# Patient Record
Sex: Female | Born: 1945 | Race: White | Hispanic: No | Marital: Married | Smoking: Never smoker
Health system: Southern US, Community
[De-identification: ages and names within clinical notes are randomized; demographics above are authoritative.]

## PROBLEM LIST (undated history)

## (undated) DIAGNOSIS — J45909 Unspecified asthma, uncomplicated: Secondary | ICD-10-CM

## (undated) DIAGNOSIS — Z992 Dependence on renal dialysis: Secondary | ICD-10-CM

## (undated) DIAGNOSIS — N186 End stage renal disease: Secondary | ICD-10-CM

## (undated) DIAGNOSIS — M1990 Unspecified osteoarthritis, unspecified site: Secondary | ICD-10-CM

## (undated) DIAGNOSIS — I10 Essential (primary) hypertension: Secondary | ICD-10-CM

## (undated) DIAGNOSIS — H547 Unspecified visual loss: Secondary | ICD-10-CM

## (undated) HISTORY — PX: KNEE ARTHROSCOPY: SUR90

## (undated) HISTORY — PX: APPENDECTOMY: SHX54

---

## 2008-05-20 ENCOUNTER — Encounter: Payer: Self-pay | Admitting: Internal Medicine

## 2008-11-05 ENCOUNTER — Encounter: Payer: Self-pay | Admitting: Internal Medicine

## 2008-11-11 ENCOUNTER — Encounter: Payer: Self-pay | Admitting: Internal Medicine

## 2008-11-25 ENCOUNTER — Ambulatory Visit: Payer: Self-pay | Admitting: Internal Medicine

## 2008-11-25 DIAGNOSIS — R0989 Other specified symptoms and signs involving the circulatory and respiratory systems: Secondary | ICD-10-CM

## 2008-11-25 DIAGNOSIS — J984 Other disorders of lung: Secondary | ICD-10-CM

## 2008-11-25 DIAGNOSIS — R0602 Shortness of breath: Secondary | ICD-10-CM

## 2008-11-25 DIAGNOSIS — R0609 Other forms of dyspnea: Secondary | ICD-10-CM | POA: Insufficient documentation

## 2008-12-03 ENCOUNTER — Telehealth: Payer: Self-pay | Admitting: Internal Medicine

## 2008-12-06 ENCOUNTER — Ambulatory Visit: Payer: Self-pay | Admitting: Internal Medicine

## 2008-12-06 LAB — CONVERTED CEMR LAB
Chloride: 113 meq/L — ABNORMAL HIGH (ref 96–112)
Creatinine, Ser: 0.7 mg/dL (ref 0.4–1.2)
GFR calc non Af Amer: 90.26 mL/min (ref >60)
Sodium: 147 meq/L — ABNORMAL HIGH (ref 135–145)

## 2008-12-08 ENCOUNTER — Ambulatory Visit: Payer: Self-pay | Admitting: Cardiology

## 2008-12-10 ENCOUNTER — Ambulatory Visit: Payer: Self-pay | Admitting: Internal Medicine

## 2008-12-20 ENCOUNTER — Encounter: Payer: Self-pay | Admitting: Internal Medicine

## 2009-09-09 ENCOUNTER — Ambulatory Visit: Payer: Self-pay | Admitting: Internal Medicine

## 2009-09-27 ENCOUNTER — Ambulatory Visit: Payer: Self-pay | Admitting: Internal Medicine

## 2009-09-27 DIAGNOSIS — J45909 Unspecified asthma, uncomplicated: Secondary | ICD-10-CM | POA: Insufficient documentation

## 2010-01-02 ENCOUNTER — Ambulatory Visit: Payer: Self-pay | Admitting: Internal Medicine

## 2010-04-03 ENCOUNTER — Encounter: Payer: Self-pay | Admitting: Internal Medicine

## 2010-04-03 ENCOUNTER — Ambulatory Visit: Payer: Self-pay | Admitting: Internal Medicine

## 2010-04-03 DIAGNOSIS — E669 Obesity, unspecified: Secondary | ICD-10-CM | POA: Insufficient documentation

## 2010-06-06 NOTE — Assessment & Plan Note (Signed)
Summary: rov w/pft   Visit Type:  Follow-up Copy to:  Orvilla Cornwall, FNP-C [facility] Primary Provider/Referring Provider:  Orvilla Cornwall, [facility] and Dr. Doreen Beam  CC:  pt here to discuss CT and PFT results. .  History of Present Illness: Followup dyspnea eval and RML nodule in Obese, non-smoking female.   OV 09/27/2009: Last seen in August 2010. She is returning now after having had CT chest on 09/09/2009. Since last visit overall wel. Has picked some throat cold 6-7 weeks ago with sputum but this is now clearing up. This has not made baseline dyspnea worse. STill dyspneic with exertion of 1 flight of stairs and with heat and humidity. No associated wheeze, hemoptysis, edema, fever, chils, nausea, vomit, diarrhea. Of note, shetake ibuprofen daily for DJD  Current Medications (verified): 1)  Ibuprofen 200 Mg Tabs (Ibuprofen) .... 4 Tabs Twice Daily  Allergies (verified): 1)  ! * Tetanus  Past History:  Past Surgical History: Last updated: 11/25/2008 Appendectomy Knee Arthroscopy  Family History: Last updated: 11/24/2008 Mother- hepatitis C, HTN Fatther-living 86, CAD, hypoglycemic, obesity, HTN  Social History: Last updated: 11/25/2008 Patient never smoked.  Married Self-employed  Risk Factors: Smoking Status: never (11/25/2008)  Past Medical History: Labs: 05/20/2008 BUN 15, Creatinine 0.58, hgb 12.4gm%, Albumin 3.8 (obtained from old chart) Arthritis - On daily ibuprofen  Family History: Reviewed history from 11/24/2008 and no changes required. Mother- hepatitis C, HTN Fatther-living 41, CAD, hypoglycemic, obesity, HTN  Social History: Reviewed history from 11/25/2008 and no changes required. Patient never smoked.  Married Self-employed  Review of Systems       The patient complains of productive cough.  The patient denies shortness of breath with activity, shortness of breath at rest, non-productive cough, coughing up blood,  chest pain, irregular heartbeats, acid heartburn, indigestion, loss of appetite, weight change, abdominal pain, difficulty swallowing, sore throat, tooth/dental problems, headaches, nasal congestion/difficulty breathing through nose, sneezing, itching, ear ache, anxiety, depression, hand/feet swelling, joint stiffness or pain, rash, change in color of mucus, and fever.    Vital Signs:  Patient profile:   65 year old female Height:      64 inches Weight:      300 pounds BMI:     51.68 O2 Sat:      94 % on Room air Temp:     98.4 degrees F oral Pulse rate:   97 / minute BP sitting:   180 / 70  (left arm) Cuff size:   large  Vitals Entered By: [name] CMA (Sep 27, 2009 1:54 PM)  O2 Flow:  Room air CC: pt here to discuss CT and PFT results.  Comments Medications reviewed with patient [name] CMA  Sep 27, 2009 1:57 PM Daytime phone number verified with patient.    Physical Exam  General:  obese.  no distrss Head:  normocephalic and atraumatic Eyes:  PERRLA/EOM intact; conjunctiva and sclera clear Ears:  TMs intact and clear with normal canals Nose:  no deformity, discharge, inflammation, or lesions Mouth:  no deformity or lesions Neck:  no masses, thyromegaly, or abnormal cervical nodes Chest Wall:  no deformities noted Lungs:  clear bilaterally to auscultation and percussion Heart:  regular rate and rhythm, S1, S2 without murmurs, rubs, gallops, or clicks Abdomen:  bowel sounds positive; abdomen soft and non-tender without masses, or organomegaly Msk:  no deformity or scoliosis noted with normal posture Pulses:  pulses normal Extremities:  no clubbing, cyanosis, edema, or deformity  noted Neurologic:  CN II-XII grossly intact with normal reflexes, coordination, muscle strength and tone Skin:  intact without lesions or rashes Cervical Nodes:  no significant adenopathy Axillary Nodes:  no significant adenopathy Psych:  alert and cooperative; normal mood and  affect; normal attention span and concentration   MISC. Report  Procedure date:  09/27/2009  Findings:      PFTS fev1 1.47L/66% FVC 2.25L/74% BD response with fev1 is 230cc and 11% TLC 94% DLCO 18.4/63%  consistentwith asthma   Comments:      independently reviewed  CT of Chest  Procedure date:  09/09/2009  Findings:      Findings: There are no enlarged axillary or supraclavicular lymph nodes.   No enlarged mediastinal or hilar lymph nodes.   There is no pericardial or pleural effusion.   Right middle lobe subpleural nodule measures 6 mm. This is unchanged from prior exam.   There are scar like densities identified within both lung bases.   Review of the visualized osseous structures shows mild spondylosis within the thoracic spine.   Limited imaging through the upper abdomen is unremarkable.   IMPRESSION:   1.  Stable pulmonary nodule within the right middle lobe.  This documents a stability of approximately 9 months. If this patient is at low risk for developing lung cancer a follow-up examination at 18- 24 months is recommended.  If this patient is at increased risk for developing lung cancer than the next follow-up examination should be obtained at 12 months. This recommendation follows the consensus statement: Guidelines for Management of Small Pulmonary Nodules Detected on CT Scans:  A Statement from the Fleischner Society as published in Radiology 2005; 237:395-400.  Available online at:  LittlePageant.ch  Comments:      independently reviewed and agree  Impression & Recommendations:  Problem # 1:  PLEURAL EFFUSION, RIGHT (ICD-511.9) Assessment Improved  resolved on CT chest 09/09/2009  plan no further followup  Orders: Est. Patient Level IV (16109)  Problem # 2:  PULMONARY NODULE (ICD-518.89) Assessment: Unchanged  11/11/2008 [facility] CT - 6mm RML  nodule and 1cm Rt Hilar node 12/08/2008 [facility] - 6mm  RML nodule 09/09/2009 [facility] - 6mm RMLnodule. Unchnaged. No hilar node  plan repeat ct chest July 2012 (24 month CT), she is non smoker Radiology Referral (Radiology)  Orders: Radiology Referral (Radiology) Est. Patient Level IV (60454)  Problem # 3:  SNORING (ICD-786.09) Assessment: Comment Only  Her updated medication list for this problem includes:    Ventolin Hfa 108 (90 Base) Mcg/act Aers (Albuterol sulfate) .Marland Kitchen... 1-2 puffs every 4-6 hours as needed    Qvar 80 Mcg/act Aers (Beclomethasone dipropionate) .Marland Kitchen..Marland Kitchen Two  puffs twice daily. rinse mouth after each use  obese, epworth score is only 2  plan monitor for now  Orders: Est. Patient Level IV (09811)  Problem # 4:  INTRINSIC ASTHMA, UNSPECIFIED (ICD-493.10) Assessment: New  PFTs suggest Moderate Persistent Asthma with low DLCO NOS.  plan start qvar 2 puff two times a day diagnosis explained take pro-air 2 puff as needed rov 3 months with spirometry  Orders: Est. Patient Level IV (91478)  Medications Added to Medication List This Visit: 1)  Ibuprofen 200 Mg Tabs (Ibuprofen) .... 4 tabs twice daily 2)  Ventolin Hfa 108 (90 Base) Mcg/act Aers (Albuterol sulfate) .Marland Kitchen.. 1-2 puffs every 4-6 hours as needed 3)  Qvar 80 Mcg/act Aers (Beclomethasone dipropionate) .... Two  puffs twice daily. rinse mouth after each use  Patient Instructions: 1)  you have asthma on breathing test 2)  see iif you can stop ibuprofen 3)  instead take tylenol extra strength 2 tablets three times daily 4)  see if tylenol cna control your arthritis 5)  for asthma take qvar 2 puff two times a day- maintenance 6)  take pro-air 2 puff as needed - for emergency use 7)  learn technique from my nurse 8)  return in 3 months for followup asthma 9)  at folowup will do office spiromerty 10)  your lung nodule is staable since [id number] 11)  Next CT scan chest is July/August 2012 12)  call or come sooner for any new  problems Prescriptions: QVAR 80 MCG/ACT  AERS (BECLOMETHASONE DIPROPIONATE) Two  puffs twice daily. Rinse mouth after each use  #1 x 6   Entered and Authorized by:   Kalman Shan MD   Signed by:   Kalman Shan MD on 09/27/2009   Method used:   Electronically to        [facility]* (retail)       [address]       [place], [place]  [address]       Ph: [phone]       Fax: [phone]   RxID:   [id number] VENTOLIN HFA 108 (90 BASE) MCG/ACT  AERS (ALBUTEROL SULFATE) 1-2 puffs every 4-6 hours as needed  #1 x 6   Entered and Authorized by:   Kalman Shan MD   Signed by:   Kalman Shan MD on 09/27/2009   Method used:   Electronically to        [facility]* (retail)       [address]       [place], [place]  [address]       Ph: [phone]       Fax: [phone]   RxID:   [id number]     Appended Document: rov w/pft     Allergies: 1)  ! * Tetanus   Other Orders: Prescription Created Electronically [id number])

## 2010-06-06 NOTE — Assessment & Plan Note (Signed)
Summary: per recall/mhh   Visit Type:  Follow-up Copy to:  Orvilla Cornwall, FNP-C [facility] Primary Provider/Referring Provider:  Orvilla Cornwall, FNP-C [facility] and Dr. Doreen Beam  CC:  3 month follow up, pt was  changed from qvar to dulera 100, states she does not notice any change in sob, sob worse with exertion, and not using pro-air.  History of Present Illness: Followup dyspnea eval and RML nodule in Obese, non-smoking female.   OV 01/02/2010:  Last seen in May 2011 and August 2011.At last visit I noticed improvement in fev1 with QVAR but dyspnea was no better. SO, lsat visit we started empiric dulera. TOday she states dulera is not helping either.   STill dyspneic walking 1 flight of stairs and relieved by rest. Heat and humidity stil make her more dyspneic. Marland Kitchen No other complaints.  No associated wheeze, hemoptysis, edema, fever, chils, nausea, vomit, diarrhea. She feels weight and deconditioning are main issues contributing to dyspnea. Not inclined for rehab or CPST due to knee issues and $ issues. In terms of nodule, final 24 month CT is pending in July 2012. Of note, she has lost insurance and is finding it tough to pay bills.   Preventive Screening-Counseling & Management  Alcohol-Tobacco     Smoking Status: never  Current Medications (verified): 1)  Aleve 220 Mg Tabs (Naproxen Sodium) .... As Directed 2)  Proair Hfa 108 (90 Base) Mcg/act Aers (Albuterol Sulfate) .... As Needed 3)  Dulera 100-5 Mcg/act Aero (Mometasone Furo-Formoterol Fum)  Allergies (verified): 1)  ! * Tetanus  Past History:  Past medical, surgical, family and social histories (including risk factors) reviewed, and no changes noted (except as noted below).  Past Medical History: Reviewed history from 09/27/2009 and no changes required. Labs: 05/20/2008 BUN 15, Creatinine 0.58, hgb 12.4gm%, Albumin 3.8 (obtained from old chart) Arthritis - On daily ibuprofen  Past Surgical  History: Reviewed history from 11/25/2008 and no changes required. Appendectomy Knee Arthroscopy  Family History: Reviewed history from 11/24/2008 and no changes required. Mother- hepatitis C, HTN Fatther-living 98, CAD, hypoglycemic, obesity, HTN  Social History: Reviewed history from 11/25/2008 and no changes required. Patient never smoked.  Married Self-employed  Review of Systems       The patient complains of shortness of breath with activity, non-productive cough, weight change, hand/feet swelling, and joint stiffness or pain.  The patient denies shortness of breath at rest, productive cough, coughing up blood, chest pain, irregular heartbeats, acid heartburn, indigestion, loss of appetite, abdominal pain, difficulty swallowing, sore throat, tooth/dental problems, headaches, nasal congestion/difficulty breathing through nose, sneezing, itching, ear ache, anxiety, depression, rash, change in color of mucus, and fever.    Vital Signs:  Patient profile:   65 year old female Height:      65 inches Weight:      309.4 pounds BMI:     51.67 O2 Sat:      96 % on Room air Temp:     97.8 degrees F oral Pulse rate:   93 / minute BP sitting:   140 / 82  (left arm) Cuff size:   large  Vitals Entered By: Renold Genta RCP, LPN (April 03, 2010 9:25 AM)  O2 Flow:  Room air CC: 3 month follow up, pt was  changed from qvar to dulera 100, states she does not notice any change in sob, sob worse with exertion, not using pro-air Comments Medications reviewed with patient Renold Genta RCP, LPN  April 03, 2010 9:35 AM    Physical Exam  General:  obese.  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 Pulses:  pulses normal Extremities:  no clubbing, cyanosis, edema, or deformity noted Neurologic:  CN II-XII grossly intact with normal reflexes, coordination, muscle strength and tone Skin:  intact without lesions or rashes Cervical Nodes:  no significant adenopathy Axillary Nodes:  no significant adenopathy Psych:  alert and cooperative; normal mood and affect; normal attention span and concentration   Pulmonary Function Test Date: 04/03/2010 Gender: Female  Pre-Spirometry FVC    Value: 2.6 L/min   % Pred: 78 % FEV1    Value: 1.8 L     % Pred: 71 % FEV1/FVC  Value: 70 %     Comments: unchnaged  Impression & Recommendations:  Problem # 1:  OBESITY, UNSPECIFIED (ICD-278.00) Assessment Unchanged check TSH. FT4 Orders: TLB-T3, Free (Triiodothyronine) (84481-T3FREE) TLB-TSH (Thyroid Stimulating Hormone) (84443-TSH) Est. Patient Level III (16109)  Problem # 2:  PULMONARY NODULE (ICD-518.89) Assessment: Unchanged  11/11/2008 [facility] CT - 6mm RML  nodule and 1cm Rt Hilar node 12/08/2008 [facility] - 6mm RML nodule 09/09/2009 [facility] - 6mm RMLnodule. Unchnaged. No hilar node  plan repeat ct chest July 2012 (24 month CT), she is non smoker Radiology Referral (Radiology)  Orders: Est. Patient Level III (60454)  Problem # 3:  PLEURAL EFFUSION, RIGHT (ICD-511.9) Assessment: Comment Only  resolved on CT chest 09/09/2009  plan no further followup  Problem # 4:  SHORTNESS OF BREATH (SOB) (ICD-786.05) Assessment: Unchanged empiric dulera and qvar have not helped. TEHre is no chest pain to suggest CAD. CT has ruled out PE in past (2010). Non-smoker. No evidence of COPD or IPF on prior  CTs. Suspect deconditoning and obesity (BMI 51) as majore etiologies. SHe is unable to do CPST due to knee issues. Unwilling/Unable to do rehab. $ issues as well. So, will monitor clinically. IF develops chest pain or dyspnea worsens, will get cardiac stress test Orders: TLB-T3, Free (Triiodothyronine) (84481-T3FREE) TLB-TSH (Thyroid Stimulating Hormone) (84443-TSH)  Medications Added to Medication List This Visit: 1)  Dulera 100-5 Mcg/act Aero (Mometasone furo-formoterol fum)  Patient Instructions: 1)  please stop dulera 2)  check blood test for thyroid function studies today 3)  retrun in July 2012 with CT chest 4)  in between, keep any eye on your symptoms 5)  if you get more short of breath or have chest pain, then call us or  come sooner

## 2010-06-06 NOTE — Assessment & Plan Note (Signed)
Summary: 3 months/ mbw   Visit Type:  Follow-up Copy to:  Orvilla Cornwall, FNP-C [facility] Primary Provider/Referring Provider:  Orvilla Cornwall, [facility] Internal Med and Dr. Doreen Beam  CC:  Pt here for 3 month follow-up. Pt states she is taking qvar daily  and but sees no difference in breathing. Marland Kitchen  History of Present Illness: Followup dyspnea eval and RML nodule in Obese, non-smoking female.   OV 01/02/2010:  Last seen in May 2011. Started QVAR for PFTs that suggested asthma. Now following up. No difference in dyspnea on exertion despite QVAR and switching ibuprofen for alleve. STill dyspneic walking 1 flight of stairs and relieved by rest. Heat and humidity stil make her more dyspneic. Marland Kitchen No other complaints.  No associated wheeze, hemoptysis, edema, fever, chils, nausea, vomit, diarrhea  Preventive Screening-Counseling & Management  Alcohol-Tobacco     Smoking Status: never  Current Medications (verified): 1)  Aleve 220 Mg Tabs (Naproxen Sodium) .... As Directed 2)  Qvar 80 Mcg/act Aers (Beclomethasone Dipropionate) .... 2 Puffs Twice Daily 3)  Proair Hfa 108 (90 Base) Mcg/act Aers (Albuterol Sulfate) .... As Needed  Allergies (verified): 1)  ! * Tetanus  Past History:  Past medical, surgical, family and social histories (including risk factors) reviewed, and no changes noted (except as noted below).  Past Medical History: Reviewed history from 09/27/2009 and no changes required. Labs: 05/20/2008 BUN 15, Creatinine 0.58, hgb 12.4gm%, Albumin 3.8 (obtained from old chart) Arthritis - On daily ibuprofen  Past Surgical History: Reviewed history from 11/25/2008 and no changes required. Appendectomy Knee Arthroscopy  Family History: Reviewed history from 11/24/2008 and no changes required. Mother- hepatitis C, HTN Fatther-living 80, CAD, hypoglycemic, obesity, HTN  Social History: Reviewed history from 11/25/2008 and no changes required. Patient never smoked.   Married Self-employed  Review of Systems       The patient complains of dyspnea on exertion.  The patient denies anorexia, fever, weight loss, weight gain, vision loss, decreased hearing, hoarseness, chest pain, syncope, peripheral edema, prolonged cough, headaches, hemoptysis, abdominal pain, melena, hematochezia, severe indigestion/heartburn, hematuria, incontinence, genital sores, muscle weakness, suspicious skin lesions, transient blindness, difficulty walking, depression, unusual weight change, abnormal bleeding, enlarged lymph nodes, angioedema, breast masses, and testicular masses.         djd  Vital Signs:  Patient profile:   65 year old female Height:      64 inches Weight:      303 pounds BMI:     52.20 O2 Sat:      97 % on Room air Temp:     98.1 degrees F oral Pulse rate:   87 / minute BP sitting:   130 / 78  (left arm) Cuff size:   large  Vitals Entered By: [name] CMA (January 02, 2010 1:35 PM)  O2 Flow:  Room air CC: Pt here for 3 month follow-up. Pt states she is taking qvar daily , but sees no difference in breathing.  Comments Medications reviewed with patient [name] CMA  January 02, 2010 1:37 PM Daytime phone number verified with patient.    Physical Exam  General:  obese.  no distrss Head:  normocephalic and atraumatic Eyes:  PERRLA/EOM intact; conjunctiva and sclera clear Ears:  TMs intact and clear with normal canals Nose:  no deformity, discharge, inflammation, or lesions Mouth:  no deformity or lesions Neck:  no masses, thyromegaly, or abnormal cervical nodes Chest Wall:  no deformities noted Lungs:  clear bilaterally  to auscultation and percussion Heart:  regular rate and rhythm, S1, S2 without murmurs, rubs, gallops, or clicks Abdomen:  bowel sounds positive; abdomen soft and non-tender without masses, or organomegaly Msk:  no deformity or scoliosis noted with normal posture Pulses:  pulses normal Extremities:  no clubbing,  cyanosis, edema, or deformity noted Neurologic:  CN II-XII grossly intact with normal reflexes, coordination, muscle strength and tone Skin:  intact without lesions or rashes Cervical Nodes:  no significant adenopathy Axillary Nodes:  no significant adenopathy Psych:  alert and cooperative; normal mood and affect; normal attention span and concentration   Pulmonary Function Test Date: 01/02/2010 1:38 PM Gender: Female  Pre-Spirometry FVC    Value: 2.50 L/min   % Pred: 77.50 % FEV1    Value: 1.68 L     Pred: 2.48 L     % Pred: 67.60 % FEV1/FVC  Value: 67.13 %     % Pred: 86.50 %  Comments: 200 cc improvement in fev1 after starting qvar  Evaluation: moderate obstruction with significant bronchodilator response  Impression & Recommendations:  Problem # 1:  INTRINSIC ASTHMA, UNSPECIFIED (ICD-493.10) Assessment Unchanged  PFTs May 2011 suggest Moderate Persistent Asthma with low DLCO NOS. SPiro shows 200cc improvementi n FEv1 with QVAR in August 2011 but no change in symptoms  plan change qvar to dulera (Samples) 2 puff two times a day call in 1 month to report progress take pro-air 2 puff as needed rov 3 months with spirometry refer pulmonary rehab at [facility]  Problem # 2:  PULMONARY NODULE (ICD-518.89) Assessment: Comment Only  11/11/2008 [facility] CT - 6mm RML  nodule and 1cm Rt Hilar node 12/08/2008 [facility] - 6mm RML nodule 09/09/2009 [facility] - 6mm RMLnodule. Unchnaged. No hilar node  plan repeat ct chest July 2012 (24 month CT), she is non smoker Radiology Referral (Radiology)  Orders: Est. Patient Level III (29562)  Problem # 3:  SNORING (ICD-786.09) Assessment: Comment Only  Her updated medication list for this problem includes:    Dulera 100-5 Mcg/act Aero (Mometasone furo-formoterol fum) .Marland Kitchen... 2 puffs twice per day    Proair Hfa 108 (90 Base) Mcg/act Aers (Albuterol sulfate) .Marland Kitchen... As needed  Medications Added to Medication List This Visit: 1)  Aleve 220 Mg  Tabs (Naproxen sodium) .... As directed 2)  Dulera 100-5 Mcg/act Aero (Mometasone furo-formoterol fum) .... 2 puffs twice per day 3)  Proair Hfa 108 (90 Base) Mcg/act Aers (Albuterol sulfate) .... As needed  Other Orders: Prescription Created Electronically [id number])  Patient Instructions: 1)  STOP QVAR 2)  Take 2 samples of dulera  3)  Take dulera 2 puff two times a day 4)  Call me in 1 month to report progress 5)  ATtend pulmonary rehab at [facility] 6)  have flu shot in fall 7)  return in 3 months 8)  will do spirometry at followup Prescriptions: DULERA 100-5 MCG/ACT AERO (MOMETASONE FURO-FORMOTEROL FUM) 2 puffs twice per day  #1 x 6   Entered and Authorized by:   Kalman Shan MD   Signed by:   Kalman Shan MD on 01/02/2010   Method used:   Historical   RxID:   [id number]    CardioPerfect Spirometry  ID: [id number] Patient: Kaylee [name] DOB: 21-Nov-1945 Age: 65 Years Old Sex: Female Race: White Physician: ramaswamy Height: 64 Weight: 303 Status: Unconfirmed Past Medical History:  Labs: 05/20/2008 BUN 15, Creatinine 0.58, hgb 12.4gm%, Albumin 3.8 (obtained from old chart) Arthritis - On daily ibuprofen Recorded: 01/02/2010  1:38 PM  Parameter  Measured Predicted %Predicted FVC     2.50        3.22        77.50 FEV1     1.68        2.48        67.60 FEV1%   67.13        77.63        86.50 PEF    4.43        6.15        72   Interpretation:

## 2010-06-06 NOTE — Miscellaneous (Signed)
Summary: Orders Update pft charges  Clinical Lists Changes  Orders: Added new Service order of Carbon Monoxide diffusing w/capacity (94720) - Signed Added new Service order of Lung Volumes (94240) - Signed Added new Service order of Spirometry (Pre & Post) (94060) - Signed 

## 2010-11-07 ENCOUNTER — Other Ambulatory Visit: Payer: Self-pay | Admitting: Internal Medicine

## 2010-11-07 DIAGNOSIS — R911 Solitary pulmonary nodule: Secondary | ICD-10-CM

## 2010-11-10 ENCOUNTER — Ambulatory Visit (INDEPENDENT_AMBULATORY_CARE_PROVIDER_SITE_OTHER): Payer: Self-pay | Admitting: Adult Health

## 2010-11-10 ENCOUNTER — Encounter: Payer: Self-pay | Admitting: Adult Health

## 2010-11-10 DIAGNOSIS — J45909 Unspecified asthma, uncomplicated: Secondary | ICD-10-CM

## 2010-11-10 DIAGNOSIS — J984 Other disorders of lung: Secondary | ICD-10-CM

## 2010-11-10 NOTE — Assessment & Plan Note (Addendum)
Documented Bronchodilator change on previous PFT c/w with Asthma  Not compensated off controller inhalers.  Suspect her dyspnea is multifactoral with obesity, fluid retention- would like to at least  Check an xray and routine labs however she declines.   Plan:  Begin Advair 100/12mcg 1 puffs Twice daily  -brush rinse and gargle after use. (Advair has a good pt assistance program)  We do not have any Dulera sample, Advair inhaler teaching .  We are looking into patient assistance program for Advair.  Follow up with Family doctor regarding you blood pressure.  If you change your mind about xray/CT  and labs , please call back  follow up Dr. Marchelle Gearing in 6-8 weeks and As needed

## 2010-11-10 NOTE — Assessment & Plan Note (Signed)
Due for follow up CT chest however she declines.  She says she will call back if she changes her mind.

## 2010-11-10 NOTE — Progress Notes (Signed)
Subjective:    Patient ID: Kaylee [name], female    DOB: 1946-03-23, 65 y.o.   MRN: [id number]  HPI 65 yo never smoker ,with Asthma   OV 01/02/2010: Last seen in May 2011 and August 2011.At last visit I noticed improvement in fev1 with QVAR but dyspnea was no better. SO, lsat visit we started empiric dulera. TOday she states dulera is not helping either. STill dyspneic walking 1 flight of stairs and relieved by rest. Heat and humidity stil make her more dyspneic. Marland Kitchen No other complaints. No associated wheeze, hemoptysis, edema, fever, chils, nausea, vomit, diarrhea. She feels weight and deconditioning are main issues contributing to dyspnea. Not inclined for rehab or CPST due to knee issues and $ issues. In terms of nodule, final 24 month CT is pending in July 2012. Of note, she has lost insurance and is finding it tough to pay bills.   11/10/2010 Acute OV  Complains of increased SOB, some wheezing and coughing x44months.  Previous on [facility] which did help some but she could not afford due to finances . She and her husband do not work and have no income. They do not qualify for Medicaid. She does not have any medication coverage. She is due for a follow up CT chest for pulmonary nodule. She is a never smoker and last year serial follow up CT was without change. She declines at this time due to no insurance , I suggested she get a xray today however she declined.  She says she has intermittent wheezing and dry cough at times. Also some ankle edema. Blood pressure is elevated today , we discussed labs however she declined . Says she  Will go see her PCP soon to discuss this.      Review of Systems Constitutional:   No  weight loss, night sweats,  Fevers, chills, fatigue, or  lassitude.  HEENT:   No headaches,  Difficulty swallowing,  Tooth/dental problems, or  Sore throat,                No sneezing, itching, ear ache, nasal congestion, post nasal drip,   CV:  No chest pain,  Orthopnea, PND, ,  anasarca, dizziness, palpitations, syncope.   GI  No heartburn, indigestion, abdominal pain, nausea, vomiting, diarrhea, change in bowel habits, loss of appetite, bloody stools.   Resp:    No excess mucus, no productive cough,     No coughing up of blood.  No change in color of mucus.     No chest wall deformity  Skin: no rash or lesions.  GU: no dysuria, change in color of urine, no urgency or frequency.  No flank pain, no hematuria   MS:  No joint  swelling.  No decreased range of motion.     Psych:  No change in mood or affect. No depression or anxiety.  No memory loss.          Objective:   Physical Exam GEN: A/Ox3; pleasant , NAD, morbidly obese   HEENT:  [place]/AT,  EACs-clear, TMs-wnl, NOSE-clear, THROAT-clear, no lesions, no postnasal drip or exudate noted.   NECK:  Supple w/ fair ROM; no JVD; normal carotid impulses w/o bruits; no thyromegaly or nodules palpated; no lymphadenopathy.  RESP  Clear  P & A; w/o, wheezes/ rales/ or rhonchi.no accessory muscle use, no dullness to percussion  CARD:  RRR, no m/r/g  ,1+peripheral edema, pulses intact, no cyanosis or clubbing.  GI:   Soft & nt; nml bowel sounds;  no organomegaly or masses detected.  Musco: Warm bil, no deformities or joint swelling noted.   Neuro: alert, no focal deficits noted.    Skin: Warm, no lesions or rashes         Assessment & Plan:

## 2010-11-10 NOTE — Patient Instructions (Addendum)
Begin Advair 100/53mcg 1 puffs Twice daily  -brush rinse and gargle after use.  We are looking into patient assistance program for Advair.  Follow up with Family doctor regarding you blood pressure.  If you change your mind about xray/CT  and labs , please call back  follow up Dr. Marchelle Gearing in 6-8 weeks and As needed

## 2010-11-22 ENCOUNTER — Other Ambulatory Visit: Payer: Self-pay

## 2010-11-24 ENCOUNTER — Telehealth: Payer: Self-pay | Admitting: Internal Medicine

## 2010-11-24 NOTE — Telephone Encounter (Signed)
Called, spoke with pt.  She is requesting samples advair 100/50.  Still working on pt assistance forms.  Will bring those by when she picks up samples.  2 samples left at front -- pt aware  Lot # [id number] Exp Date 01/2012

## 2010-12-01 ENCOUNTER — Other Ambulatory Visit: Payer: Self-pay | Admitting: Internal Medicine

## 2010-12-01 MED ORDER — FLUTICASONE-SALMETEROL 100-50 MCG/DOSE IN AEPB: 1.0000 | INHALATION_SPRAY | Freq: Two times a day (BID) | RESPIRATORY_TRACT | Status: DC

## 2010-12-11 ENCOUNTER — Telehealth: Payer: Self-pay | Admitting: Internal Medicine

## 2010-12-11 NOTE — Telephone Encounter (Signed)
Pt last seen 7.6.12 by TP and has upcoming appt with MR 9.7.12.  Has begun process for patient assistance for advair but pt reports this is still pending.  2 advair samples given to pt in lobby.  Will sign off on phone note.

## 2011-01-12 ENCOUNTER — Encounter: Payer: Self-pay | Admitting: Internal Medicine

## 2011-01-12 ENCOUNTER — Ambulatory Visit (INDEPENDENT_AMBULATORY_CARE_PROVIDER_SITE_OTHER): Payer: Self-pay | Admitting: Internal Medicine

## 2011-01-12 VITALS — BP 142/80 | HR 103 | Temp 97.9°F | Ht 64.0 in | Wt 313.4 lb

## 2011-01-12 DIAGNOSIS — R0602 Shortness of breath: Secondary | ICD-10-CM

## 2011-01-12 DIAGNOSIS — Z23 Encounter for immunization: Secondary | ICD-10-CM

## 2011-01-12 DIAGNOSIS — J984 Other disorders of lung: Secondary | ICD-10-CM

## 2011-01-12 DIAGNOSIS — J45909 Unspecified asthma, uncomplicated: Secondary | ICD-10-CM

## 2011-01-12 NOTE — Assessment & Plan Note (Signed)
Multifactorial: asthma, obesity, djd - deconditoning. Dyspnea persists despite asthma control. She is not interested in further workup or rehab intervention at this stge due to $ issues

## 2011-01-12 NOTE — Progress Notes (Signed)
Subjective:    Patient ID: Kaylee [name], female    DOB: 02-29-1946, 65 y.o.   MRN: [id number]  HPI  65 yo never smoker ,with Asthma   OV 01/02/2010: Last seen in May 2011 and August 2011.At last visit I noticed improvement in fev1 with QVAR but dyspnea was no better. SO, lsat visit we started empiric dulera. TOday she states dulera is not helping either. STill dyspneic walking 1 flight of stairs and relieved by rest. Heat and humidity stil make her more dyspneic. Marland Kitchen No other complaints. No associated wheeze, hemoptysis, edema, fever, chils, nausea, vomit, diarrhea. She feels weight and deconditioning are main issues contributing to dyspnea. Not inclined for rehab or CPST due to knee issues and $ issues. In terms of nodule, final 24 month CT is pending in July 2012. Of note, she has lost insurance and is finding it tough to pay bills.   11/10/2010 Acute OV  Wit NP Tammy Parrrett Complains of increased SOB, some wheezing and coughing x19months.  Previous on [facility] which did help some but she could not afford due to finances . She and her husband do not work and have no income. They do not qualify for Medicaid. She does not have any medication coverage. She is due for a follow up CT chest for pulmonary nodule. She is a never smoker and last year serial follow up CT was without change. She declines at this time due to no insurance , I suggested she get a xray today however she declined.  She says she has intermittent wheezing and dry cough at times. Also some ankle edema. Blood pressure is elevated today , we discussed labs however she declined . Says she  Will go see her PCP soon to discuss this.   REC Begin Advair 100/69mcg 1 puffs Twice daily  -brush rinse and gargle after use.  We are looking into patient assistance program for Advair.  Follow up with Family doctor regarding you blood pressure.  If you change your mind about xray/CT  and labs , please call back  follow up Dr. Marchelle Gearing in 6-8  weeks and As needed     OV 9./7/12: OV for asthma, dyspnea (multifactorial) and pulmnonary nodule. REviewed NP visit notes from 11/10/10. She is getting advair for free now due to assistance program. Reports compliance. Feels it is helping overall with dyspnea, wheeze but states that some days does not feel a difference. REcently diuressed for edema with lasix but this has not made difference with dyspnea. A week ago started on anitihypertensive ACE inhibitor but unclear if this has made resp issues worse yet. She acknowledges, that obesity, DJD (antalgic gait) are contriubing to dyspnea on exertion of moderate intensity relieved by rest and that she could be deconditioned as a result. Discussed pulmonary nodule: she is categorical that she does not want imagine due to $ issues.  Past med: interim changes of lisinopril for bp and lasix for edema  Social: Financial issues. She and hsuband 1-2 years away from medicare. 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 Does not want to liquidate this  Family hx: husband suffered NSAID related Gastric ulcer - paying off $20k hospital bill.    Review of Systems  Constitutional: Positive for unexpected weight change. Negative for fever.       From fluid pills, lost 5 pounds  HENT: Negative for ear pain, nosebleeds, congestion, sore throat, rhinorrhea, sneezing, trouble swallowing, dental  problem, postnasal drip and sinus pressure.   Eyes: Negative for redness and itching.  Respiratory: Positive for cough and shortness of breath. Negative for chest tightness and wheezing.   Cardiovascular: Positive for leg swelling. Negative for palpitations.       Feet  Gastrointestinal: Negative for nausea and vomiting.  Genitourinary: Negative for dysuria.  Musculoskeletal: Positive for joint swelling.  Skin: Negative for rash.  Neurological: Negative for headaches.  Hematological: Bruises/bleeds easily.    Psychiatric/Behavioral: Positive for dysphoric mood. The patient is not nervous/anxious.        Objective:   Physical Exam  Vitals reviewed. Constitutional: She is oriented to person, place, and time. She appears well-developed and well-nourished. No distress.       obese  HENT:  Head: Normocephalic and atraumatic.  Right Ear: External ear normal.  Left Ear: External ear normal.  Mouth/Throat: Oropharynx is clear and moist. No oropharyngeal exudate.  Eyes: Conjunctivae and EOM are normal. Pupils are equal, round, and reactive to light. Right eye exhibits no discharge. Left eye exhibits no discharge. No scleral icterus.  Neck: Normal range of motion. Neck supple. No JVD present. No tracheal deviation present. No thyromegaly present.  Cardiovascular: Normal rate, regular rhythm, normal heart sounds and intact distal pulses.  Exam reveals no gallop and no friction rub.   No murmur heard. Pulmonary/Chest: Effort normal and breath sounds normal. No respiratory distress. She has no wheezes. She has no rales. She exhibits no tenderness.  Abdominal: Soft. Bowel sounds are normal. She exhibits no distension and no mass. There is no tenderness. There is no rebound and no guarding.       obese  Musculoskeletal: Normal range of motion. She exhibits no edema and no tenderness.       Antalgic gait  Lymphadenopathy:    She has no cervical adenopathy.  Neurological: She is alert and oriented to person, place, and time. She has normal reflexes. No cranial nerve deficit. She exhibits normal muscle tone. Coordination normal.  Skin: Skin is warm and dry. No rash noted. She is not diaphoretic. No erythema. No pallor.  Psychiatric: Her behavior is normal. Judgment and thought content normal.       Flat affect          Assessment & Plan:

## 2011-01-12 NOTE — Patient Instructions (Addendum)
#  Shortness of breath  - this is due to asthma, but weight and lack of fitnesss palying a role #ASthma   Continue advair   - have flu shot #BP  - stop lisinopril  - this can make your asthma worse  - my nurse will call Dr Sherril Croon office now and inform of this and have them call in a generic replacement that is not a beta blocker #Pulmonary nodule  - respect your decision not to have followup cxr and ct given all our discussions and you weighing it against cost issues  - will follow this clinically till you decide you are ready to get imaging #followup  - 6 months or sooner if needed

## 2011-01-12 NOTE — Assessment & Plan Note (Signed)
Appears stable. Cntinue advair for now Stop lisinopril (she will take it up with her PMD) I considered norvasc but she suffers from pedal edema

## 2011-01-12 NOTE — Assessment & Plan Note (Signed)
Declined followup cxr or ct due to $ issues

## 2011-12-17 ENCOUNTER — Telehealth: Payer: Self-pay | Admitting: Internal Medicine

## 2011-12-17 NOTE — Telephone Encounter (Signed)
I spoke with pt and is aware 2 samples of the advair 100-50 has been left upfront for pick up. She voiced her understanding and needed nothing further

## 2012-01-30 ENCOUNTER — Encounter: Payer: Self-pay | Admitting: Internal Medicine

## 2012-01-30 ENCOUNTER — Ambulatory Visit (INDEPENDENT_AMBULATORY_CARE_PROVIDER_SITE_OTHER): Payer: Self-pay | Admitting: Internal Medicine

## 2012-01-30 VITALS — BP 142/88 | HR 72 | Ht 64.0 in | Wt 322.6 lb

## 2012-01-30 DIAGNOSIS — J45909 Unspecified asthma, uncomplicated: Secondary | ICD-10-CM

## 2012-01-30 DIAGNOSIS — E669 Obesity, unspecified: Secondary | ICD-10-CM

## 2012-01-30 DIAGNOSIS — R911 Solitary pulmonary nodule: Secondary | ICD-10-CM

## 2012-01-30 DIAGNOSIS — Z23 Encounter for immunization: Secondary | ICD-10-CM

## 2012-01-30 DIAGNOSIS — J984 Other disorders of lung: Secondary | ICD-10-CM

## 2012-01-30 MED ORDER — ALBUTEROL SULFATE HFA 108 (90 BASE) MCG/ACT IN AERS: 2.0000 | INHALATION_SPRAY | Freq: Four times a day (QID) | RESPIRATORY_TRACT | Status: DC | PRN

## 2012-01-30 MED ORDER — FLUTICASONE-SALMETEROL 100-50 MCG/DOSE IN AEPB: 1.0000 | INHALATION_SPRAY | Freq: Two times a day (BID) | RESPIRATORY_TRACT | Status: DC

## 2012-01-30 NOTE — Assessment & Plan Note (Signed)
#  Lung nodule   - CT scan march 2014 after medicare kicks in per her  #Followup   - march 2014 with CT chest  - lost atleast 10# by March 2014

## 2012-01-30 NOTE — Assessment & Plan Note (Signed)
#  Weight management  -  we discussed extensively about weight management   - follow low glycemic diet plan that I outlined for you after extensive discussion  - For breakfast   - recommend steel cut oat meal or fiber one 60 cal (half to one cup) with low sugar 60 cal silk soymilk and some berries and one egg  - snack with berries or low glycemic fruit or mens health 35gm mixed-nut pack  - lunch and dinner - unlimited non-starchy vegetable (prefer raw fresh or roasted or grilled) with skinless chicken or fish   - drink lot of water  - avoid all moderate and high glycemic foods  - measure weight once  A week   > 50% of this > 25 min visit spent in face to face counseling (15 min visit converted to 25 min)

## 2012-01-30 NOTE — Progress Notes (Signed)
Subjective:    Patient ID: Kaylee [name], female    DOB: [birth year], 66 y.o.   MRN: [id number]  HPI 66 yo never smoker ,with Asthma    OV 01/02/2010: Last seen in May 2011 and August 2011.At last visit I noticed improvement in fev1 with QVAR but dyspnea was no better. SO, lsat visit we started empiric dulera. TOday she states dulera is not helping either. STill dyspneic walking 1 flight of stairs and relieved by rest. Heat and humidity stil make her more dyspneic. Marland Kitchen No other complaints. No associated wheeze, hemoptysis, edema, fever, chils, nausea, vomit, diarrhea. She feels weight and deconditioning are main issues contributing to dyspnea. Not inclined for rehab or CPST due to knee issues and $ issues. In terms of nodule, final 24 month CT is pending in July 2012. Of note, she has lost insurance and is finding it tough to pay bills.   11/10/2010 Acute OV  Wit NP Tammy Parrrett Complains of increased SOB, some wheezing and coughing x64months.  Previous on [facility] which did help some but she could not afford due to finances . She and her husband do not work and have no income. They do not qualify for Medicaid. She does not have any medication coverage. She is due for a follow up CT chest for pulmonary nodule. She is a never smoker and last year serial follow up CT was without change. She declines at this time due to no insurance , I suggested she get a xray today however she declined.  She says she has intermittent wheezing and dry cough at times. Also some ankle edema. Blood pressure is elevated today , we discussed labs however she declined . Says she  Will go see her PCP soon to discuss this.   REC Begin Advair 100/81mcg 1 puffs Twice daily  -brush rinse and gargle after use.  We are looking into patient assistance program for Advair.  Follow up with Family doctor regarding you blood pressure.  If you change your mind about xray/CT  and labs , please call back  follow up Dr. Marchelle Gearing in 6-8  weeks and As needed     OV 9./7/12: OV for asthma, dyspnea (multifactorial) and pulmnonary nodule. REviewed NP visit notes from 11/10/10. She is getting advair for free now due to assistance program. Reports compliance. Feels it is helping overall with dyspnea, wheeze but states that some days does not feel a difference. REcently diuressed for edema with lasix but this has not made difference with dyspnea. A week ago started on anitihypertensive ACE inhibitor but unclear if this has made resp issues worse yet. She acknowledges, that obesity, DJD (antalgic gait) are contriubing to dyspnea on exertion of moderate intensity relieved by rest and that she could be deconditioned as a result. Discussed pulmonary nodule: she is categorical that she does not want imagine due to $ issues.  Past med: interim changes of lisinopril for bp and lasix for edema  Social: Financial issues. She and hsuband 1-2 years away from medicare. 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 Does not want to liquidate this  Family hx: husband suffered NSAID related Gastric ulcer - paying off $20k hospital bill.   REC #Shortness of breath  - this is due to asthma, but weight and lack of fitnesss palying a role  #ASthma  Continue advair  - have flu shot  #BP  - stop lisinopril - this can make your  asthma worse  - my nurse will call Dr Sherril Croon office now and inform of this and have them call in a generic replacement that is not a beta blocker  #Pulmonary nodule  - respect your decision not to have followup cxr and ct given all our discussions and you weighing it against cost issues  - will follow this clinically till you decide you are ready to get imaging  #followup  - 6 months or sooner if needed   OV 01/30/2012    Dyspnea (asthma, obesity, deconditioning): Still dyspneic cmpared to a year ago. No interim ER visits, prednisone, hospitalizations, antibiotics. Overall health  same. Dyspnea unchanged. Advair takes it daily (was gettingg through drug program) and she will need new samples till medicare starts in Jan 2014. However, she is unsure if advair helping. Not had but will have flu shot.  Weigh is actually worse - 20# gain since last visit  Obesity: reviewed diet. She is essentially eating chocolates, breads, and other high glycemic foods. Very little intake of low glycemic diet. Eats canned beans. Very little fresh fruit or veggies  Lung nodule: she has agreed to have CT in 2014 after she gets medicare.   Past, Family, Social reviewed: few months away from getting medicare - Jan 2014. Still paying medical bills to [place]. Husband on disability.     Review of Systems  Constitutional: Negative for fever and unexpected weight change.  HENT: Negative for ear pain, nosebleeds, congestion, sore throat, rhinorrhea, sneezing, trouble swallowing, dental problem, postnasal drip and sinus pressure.   Eyes: Negative for redness and itching.  Respiratory: Positive for cough, shortness of breath and wheezing. Negative for chest tightness.   Cardiovascular: Negative for palpitations and leg swelling.  Gastrointestinal: Negative for nausea and vomiting.  Genitourinary: Negative for dysuria.  Musculoskeletal: Negative for joint swelling.  Skin: Negative for rash.  Neurological: Negative for headaches.  Hematological: Does not bruise/bleed easily.  Psychiatric/Behavioral: Negative for dysphoric mood. The patient is not nervous/anxious.         Current outpatient prescriptions:albuterol (PROAIR HFA) 108 (90 BASE) MCG/ACT inhaler, Inhale 2 puffs into the lungs every 6 (six) hours as needed.  , Disp: , Rfl: ;  diclofenac (VOLTAREN) 75 MG EC tablet, Take 75 mg by mouth 2 (two) times daily as needed. , Disp: , Rfl: ;  Fluticasone-Salmeterol (ADVAIR) 100-50 MCG/DOSE AEPB, Inhale 1 puff into the lungs 2 (two) times daily., Disp: , Rfl:  furosemide (LASIX) 40 MG tablet, Take  40 mg by mouth as needed. ly.  , Disp: , Rfl: ;  losartan (COZAAR) 100 MG tablet, Take 100 mg by mouth daily., Disp: , Rfl: ;  nebivolol (BYSTOLIC) 5 MG tablet, Take 5 mg by mouth daily., Disp: , Rfl: ;  potassium chloride (KLOR-CON) 10 MEQ CR tablet, Take 10 mEq by mouth as needed.  , Disp: , Rfl:  DISCONTD: Fluticasone-Salmeterol (ADVAIR DISKUS) 100-50 MCG/DOSE AEPB, Inhale 1 puff into the lungs 2 (two) times daily., Disp: 180 each, Rfl: 3  Objective:   Physical Exam Vitals reviewed. Constitutional: She is oriented to person, place, and time. She appears well-developed and well-nourished. No distress.       obese  HENT:  Head: Normocephalic and atraumatic.  Right Ear: External ear normal.  Left Ear: External ear normal.  Mouth/Throat: Oropharynx is clear and moist. No oropharyngeal exudate.  Eyes: Conjunctivae and EOM are normal. Pupils are equal, round, and reactive to light. Right eye exhibits no discharge. Left eye exhibits no  discharge. No scleral icterus.  Neck: Normal range of motion. Neck supple. No JVD present. No tracheal deviation present. No thyromegaly present.  Cardiovascular: Normal rate, regular rhythm, normal heart sounds and intact distal pulses.  Exam reveals no gallop and no friction rub.   No murmur heard. Pulmonary/Chest: Effort normal and breath sounds normal. No respiratory distress. She has no wheezes. She has no rales. She exhibits no tenderness.  Abdominal: Soft. Bowel sounds are normal. She exhibits no distension and no mass. There is no tenderness. There is no rebound and no guarding.       obese  Musculoskeletal: Normal range of motion. She exhibits no edema and no tenderness.       Antalgic gait  Lymphadenopathy:    She has no cervical adenopathy.  Neurological: She is alert and oriented to person, place, and time. She has normal reflexes. No cranial nerve deficit. She exhibits normal muscle tone. Coordination normal.  Skin: Skin is warm and dry. No rash  noted. She is not diaphoretic. No erythema. No pallor.  Psychiatric: Her behavior is normal. Judgment and thought content normal.       Flat affect          Assessment & Plan:

## 2012-01-30 NOTE — Patient Instructions (Addendum)
#  Weight management  -  we discussed extensively about weight management   - follow low glycemic diet plan that I outlined for you after extensive discussion  - For breakfast   - recommend steel cut oat meal or fiber one 60 cal (half to one cup) with low sugar 60 cal silk soymilk and some berries and one egg  - snack with berries or low glycemic fruit or mens health 35gm mixed-nut pack  - lunch and dinner - unlimited non-starchy vegetable (prefer raw fresh or roasted or grilled) with skinless chicken or fish   - drink lot of water  - avoid all moderate and high glycemic foods  - measure weight once  A week   #Asthma  - take sample or script for albuterol as needed  - take 4 samples of advair - to last you through Feb 2014. AFter that call for script when medicare has started  - take flu shot 01/30/2012 today  #Lung nodule   - CT scan march 2014  #Followup   - march 2014 with CT chest  - lost atleast 10# by March 2014

## 2012-01-30 NOTE — Assessment & Plan Note (Signed)
 #  Asthma  - take sample or script for albuterol as needed  - take 4 samples of advair - to last you through Feb 2014. AFter that call for script when medicare has started  - take flu shot 01/30/2012 today

## 2012-02-25 ENCOUNTER — Telehealth: Payer: Self-pay | Admitting: Internal Medicine

## 2012-02-25 MED ORDER — FLUTICASONE-SALMETEROL 100-50 MCG/DOSE IN AEPB: 1.0000 | INHALATION_SPRAY | Freq: Two times a day (BID) | RESPIRATORY_TRACT | Status: DC

## 2012-02-25 NOTE — Telephone Encounter (Signed)
Samples of advair up front for pick up Pt aware

## 2012-03-24 ENCOUNTER — Telehealth: Payer: Self-pay | Admitting: Internal Medicine

## 2012-03-24 MED ORDER — FLUTICASONE-SALMETEROL 100-50 MCG/DOSE IN AEPB: 1.0000 | INHALATION_SPRAY | Freq: Two times a day (BID) | RESPIRATORY_TRACT | Status: DC

## 2012-03-24 NOTE — Telephone Encounter (Signed)
Spoke with patient, requesting 4 samples Advair.  Pt is aware that 2 sample will be left upfront for her pick up. Nothing further needed.

## 2012-04-11 ENCOUNTER — Telehealth: Payer: Self-pay | Admitting: Internal Medicine

## 2012-04-11 MED ORDER — FLUTICASONE-SALMETEROL 100-50 MCG/DOSE IN AEPB: 1.0000 | INHALATION_SPRAY | Freq: Two times a day (BID) | RESPIRATORY_TRACT | Status: DC

## 2012-04-11 NOTE — Telephone Encounter (Signed)
PT informed that samples of Advair left at front desk.

## 2012-04-23 ENCOUNTER — Telehealth: Payer: Self-pay | Admitting: Internal Medicine

## 2012-04-23 MED ORDER — FLUTICASONE-SALMETEROL 100-50 MCG/DOSE IN AEPB: 1.0000 | INHALATION_SPRAY | Freq: Two times a day (BID) | RESPIRATORY_TRACT | Status: DC

## 2012-04-23 NOTE — Telephone Encounter (Signed)
Pt aware that samples are ready to be picked up.

## 2012-05-08 ENCOUNTER — Encounter (HOSPITAL_COMMUNITY): Payer: Self-pay | Admitting: *Deleted

## 2012-05-08 ENCOUNTER — Inpatient Hospital Stay (HOSPITAL_COMMUNITY)
Admission: EM | Admit: 2012-05-08 | Discharge: 2012-05-11 | DRG: 194 | Disposition: A | Discharge status: Home / Self Care | Payer: Medicare Other | Attending: Internal Medicine | Admitting: Internal Medicine

## 2012-05-08 ENCOUNTER — Emergency Department (HOSPITAL_COMMUNITY): Payer: Medicare Other

## 2012-05-08 DIAGNOSIS — J45909 Unspecified asthma, uncomplicated: Secondary | ICD-10-CM | POA: Diagnosis present

## 2012-05-08 DIAGNOSIS — N939 Abnormal uterine and vaginal bleeding, unspecified: Secondary | ICD-10-CM

## 2012-05-08 DIAGNOSIS — M169 Osteoarthritis of hip, unspecified: Secondary | ICD-10-CM

## 2012-05-08 DIAGNOSIS — E669 Obesity, unspecified: Secondary | ICD-10-CM

## 2012-05-08 DIAGNOSIS — J984 Other disorders of lung: Secondary | ICD-10-CM | POA: Diagnosis present

## 2012-05-08 DIAGNOSIS — M25552 Pain in left hip: Secondary | ICD-10-CM

## 2012-05-08 DIAGNOSIS — N898 Other specified noninflammatory disorders of vagina: Secondary | ICD-10-CM | POA: Diagnosis present

## 2012-05-08 DIAGNOSIS — I10 Essential (primary) hypertension: Secondary | ICD-10-CM | POA: Diagnosis present

## 2012-05-08 DIAGNOSIS — N179 Acute kidney failure, unspecified: Secondary | ICD-10-CM | POA: Diagnosis not present

## 2012-05-08 DIAGNOSIS — M25559 Pain in unspecified hip: Secondary | ICD-10-CM

## 2012-05-08 DIAGNOSIS — J45901 Unspecified asthma with (acute) exacerbation: Secondary | ICD-10-CM

## 2012-05-08 DIAGNOSIS — Z23 Encounter for immunization: Secondary | ICD-10-CM

## 2012-05-08 DIAGNOSIS — R0609 Other forms of dyspnea: Secondary | ICD-10-CM

## 2012-05-08 DIAGNOSIS — R0602 Shortness of breath: Secondary | ICD-10-CM

## 2012-05-08 DIAGNOSIS — R911 Solitary pulmonary nodule: Secondary | ICD-10-CM | POA: Diagnosis present

## 2012-05-08 DIAGNOSIS — J189 Pneumonia, unspecified organism: Principal | ICD-10-CM | POA: Diagnosis present

## 2012-05-08 DIAGNOSIS — M1610 Unilateral primary osteoarthritis, unspecified hip: Secondary | ICD-10-CM | POA: Diagnosis present

## 2012-05-08 DIAGNOSIS — Z6841 Body Mass Index (BMI) 40.0 and over, adult: Secondary | ICD-10-CM

## 2012-05-08 HISTORY — DX: Unspecified osteoarthritis, unspecified site: M19.90

## 2012-05-08 HISTORY — DX: Unspecified asthma, uncomplicated: J45.909

## 2012-05-08 HISTORY — DX: Essential (primary) hypertension: I10

## 2012-05-08 LAB — BASIC METABOLIC PANEL
BUN: 31 mg/dL — ABNORMAL HIGH (ref 6–23)
CO2: 26 mEq/L (ref 19–32)
Calcium: 9.3 mg/dL (ref 8.4–10.5)
Chloride: 101 mEq/L (ref 96–112)
Creatinine, Ser: 1.34 mg/dL — ABNORMAL HIGH (ref 0.50–1.10)
GFR calc Af Amer: 47 mL/min — ABNORMAL LOW (ref >90)
GFR calc non Af Amer: 41 mL/min — ABNORMAL LOW (ref >90)
Glucose, Bld: 135 mg/dL — ABNORMAL HIGH (ref 70–99)
Potassium: 3.6 mEq/L (ref 3.5–5.1)
Sodium: 139 mEq/L (ref 135–145)

## 2012-05-08 LAB — INFLUENZA PANEL BY PCR (TYPE A & B): Influenza B By PCR: NEGATIVE

## 2012-05-08 LAB — CBC
HCT: 38.2 % (ref 36.0–46.0)
Hemoglobin: 12.4 g/dL (ref 12.0–15.0)
MCH: 27.5 pg (ref 26.0–34.0)
MCHC: 32.5 g/dL (ref 30.0–36.0)
MCV: 84.7 fL (ref 78.0–100.0)
Platelets: 263 10*3/uL (ref 150–400)
RBC: 4.51 MIL/uL (ref 3.87–5.11)
RDW: 14.2 % (ref 11.5–15.5)
WBC: 18.7 10*3/uL — ABNORMAL HIGH (ref 4.0–10.5)

## 2012-05-08 MED ORDER — DM-GUAIFENESIN ER 30-600 MG PO TB12
1.0000 | ORAL_TABLET | Freq: Two times a day (BID) | ORAL | Status: DC
  Administered 2012-05-08 – 2012-05-11 (×7): 1 via ORAL
  Filled 2012-05-08 (×6): qty 1

## 2012-05-08 MED ORDER — IPRATROPIUM BROMIDE 0.02 % IN SOLN
0.5000 mg | Freq: Four times a day (QID) | RESPIRATORY_TRACT | Status: DC
  Administered 2012-05-08 – 2012-05-11 (×12): 0.5 mg via RESPIRATORY_TRACT
  Filled 2012-05-08 (×12): qty 2.5

## 2012-05-08 MED ORDER — IPRATROPIUM BROMIDE 0.02 % IN SOLN
0.5000 mg | Freq: Once | RESPIRATORY_TRACT | Status: AC
  Administered 2012-05-08: 0.5 mg via RESPIRATORY_TRACT
  Filled 2012-05-08: qty 2.5

## 2012-05-08 MED ORDER — BIOTENE DRY MOUTH MT LIQD
15.0000 mL | Freq: Two times a day (BID) | OROMUCOSAL | Status: DC
  Administered 2012-05-09 – 2012-05-11 (×4): 15 mL via OROMUCOSAL

## 2012-05-08 MED ORDER — ACETAMINOPHEN 325 MG PO TABS: 650.0000 mg | ORAL_TABLET | Freq: Four times a day (QID) | ORAL | Status: DC | PRN

## 2012-05-08 MED ORDER — DICLOFENAC SODIUM 75 MG PO TBEC
75.0000 mg | DELAYED_RELEASE_TABLET | Freq: Two times a day (BID) | ORAL | Status: DC | PRN
  Filled 2012-05-08: qty 1

## 2012-05-08 MED ORDER — PNEUMOCOCCAL VAC POLYVALENT 25 MCG/0.5ML IJ INJ
0.5000 mL | INJECTION | INTRAMUSCULAR | Status: AC
  Administered 2012-05-09: 0.5 mL via INTRAMUSCULAR
  Filled 2012-05-08: qty 0.5

## 2012-05-08 MED ORDER — ALBUTEROL SULFATE (5 MG/ML) 0.5% IN NEBU: 2.5000 mg | INHALATION_SOLUTION | RESPIRATORY_TRACT | Status: AC | PRN

## 2012-05-08 MED ORDER — ONDANSETRON HCL 4 MG PO TABS: 4.0000 mg | ORAL_TABLET | Freq: Four times a day (QID) | ORAL | Status: DC | PRN

## 2012-05-08 MED ORDER — POTASSIUM CHLORIDE IN NACL 20-0.9 MEQ/L-% IV SOLN
INTRAVENOUS | Status: DC
  Administered 2012-05-08 – 2012-05-10 (×4): via INTRAVENOUS

## 2012-05-08 MED ORDER — MAGNESIUM HYDROXIDE 400 MG/5ML PO SUSP: 30.0000 mL | Freq: Every day | ORAL | Status: DC | PRN

## 2012-05-08 MED ORDER — LOSARTAN POTASSIUM 50 MG PO TABS
100.0000 mg | ORAL_TABLET | Freq: Every day | ORAL | Status: DC
  Filled 2012-05-08: qty 2

## 2012-05-08 MED ORDER — ALBUTEROL SULFATE (5 MG/ML) 0.5% IN NEBU
2.5000 mg | INHALATION_SOLUTION | RESPIRATORY_TRACT | Status: DC
  Administered 2012-05-08 – 2012-05-11 (×13): 2.5 mg via RESPIRATORY_TRACT
  Filled 2012-05-08 (×13): qty 0.5

## 2012-05-08 MED ORDER — ONDANSETRON HCL 4 MG/2ML IJ SOLN: 4.0000 mg | Freq: Four times a day (QID) | INTRAMUSCULAR | Status: DC | PRN

## 2012-05-08 MED ORDER — NEBIVOLOL HCL 10 MG PO TABS
5.0000 mg | ORAL_TABLET | Freq: Every day | ORAL | Status: DC
  Administered 2012-05-09 – 2012-05-11 (×3): 5 mg via ORAL
  Filled 2012-05-08 (×5): qty 1

## 2012-05-08 MED ORDER — SODIUM CHLORIDE 0.9 % IV SOLN: INTRAVENOUS | Status: DC

## 2012-05-08 MED ORDER — SODIUM CHLORIDE 0.9 % IV BOLUS (SEPSIS)
1000.0000 mL | Freq: Once | INTRAVENOUS | Status: AC
  Administered 2012-05-08: 1000 mL via INTRAVENOUS

## 2012-05-08 MED ORDER — DEXTROSE 5 % IV SOLN
500.0000 mg | INTRAVENOUS | Status: DC
  Administered 2012-05-08 – 2012-05-09 (×2): 500 mg via INTRAVENOUS
  Filled 2012-05-08 (×5): qty 500

## 2012-05-08 MED ORDER — MOMETASONE FURO-FORMOTEROL FUM 100-5 MCG/ACT IN AERO
2.0000 | INHALATION_SPRAY | Freq: Two times a day (BID) | RESPIRATORY_TRACT | Status: DC
  Administered 2012-05-08 – 2012-05-11 (×6): 2 via RESPIRATORY_TRACT
  Filled 2012-05-08: qty 8.8

## 2012-05-08 MED ORDER — DEXTROSE 5 % IV SOLN
1.0000 g | INTRAVENOUS | Status: DC
  Administered 2012-05-09 – 2012-05-10 (×2): 1 g via INTRAVENOUS
  Filled 2012-05-08 (×4): qty 10

## 2012-05-08 MED ORDER — ALBUTEROL SULFATE (5 MG/ML) 0.5% IN NEBU: 2.5000 mg | INHALATION_SOLUTION | Freq: Once | RESPIRATORY_TRACT | Status: AC

## 2012-05-08 MED ORDER — ALUM & MAG HYDROXIDE-SIMETH 200-200-20 MG/5ML PO SUSP: 30.0000 mL | Freq: Four times a day (QID) | ORAL | Status: DC | PRN

## 2012-05-08 MED ORDER — HYDROCODONE-ACETAMINOPHEN 5-325 MG PO TABS
1.0000 | ORAL_TABLET | ORAL | Status: DC | PRN
  Administered 2012-05-09: 2 via ORAL
  Filled 2012-05-08: qty 2

## 2012-05-08 MED ORDER — PREDNISONE 50 MG PO TABS
60.0000 mg | ORAL_TABLET | Freq: Once | ORAL | Status: AC
  Administered 2012-05-08: 60 mg via ORAL
  Filled 2012-05-08: qty 1

## 2012-05-08 MED ORDER — TRAZODONE HCL 50 MG PO TABS
50.0000 mg | ORAL_TABLET | Freq: Every evening | ORAL | Status: DC | PRN
  Administered 2012-05-09: 50 mg via ORAL
  Filled 2012-05-08: qty 1

## 2012-05-08 MED ORDER — HEPARIN SODIUM (PORCINE) 5000 UNIT/ML IJ SOLN
5000.0000 [IU] | Freq: Three times a day (TID) | INTRAMUSCULAR | Status: DC
  Administered 2012-05-08 – 2012-05-11 (×9): 5000 [IU] via SUBCUTANEOUS
  Filled 2012-05-08 (×9): qty 1

## 2012-05-08 MED ORDER — DEXTROSE 5 % IV SOLN
1.0000 g | Freq: Once | INTRAVENOUS | Status: AC
  Administered 2012-05-08: 1 g via INTRAVENOUS
  Filled 2012-05-08: qty 10

## 2012-05-08 MED ORDER — ACETAMINOPHEN 650 MG RE SUPP: 650.0000 mg | Freq: Four times a day (QID) | RECTAL | Status: DC | PRN

## 2012-05-08 MED ORDER — ALBUTEROL SULFATE (5 MG/ML) 0.5% IN NEBU
5.0000 mg | INHALATION_SOLUTION | Freq: Once | RESPIRATORY_TRACT | Status: AC
  Administered 2012-05-08: 5 mg via RESPIRATORY_TRACT
  Filled 2012-05-08: qty 1

## 2012-05-08 NOTE — ED Notes (Signed)
Cough for 1 week, feels sob on exertiion.  Fever.

## 2012-05-08 NOTE — ED Notes (Signed)
Pt c/o cough, chest tightness/congestion, and difficulty breathing x1 week. Pt has hx of asthma and states her home inhalers are not helping. Pt's husband has been sick with similar symptoms and recently diagnosed with pneumonia.

## 2012-05-08 NOTE — H&P (Signed)
Hospital Admission Note Date: 05/08/2012  Patient name: Kaylee [name] Medical record number: [id number] Date of birth: 02/17/1946 Age: 67 y.o. Gender: female PCP: VYAS,DHRUV B., MD  Chief Complaint: Cough and shortness of breath  History of Present Illness:  Kaylee [name] is an 67 y.o. female with a history of asthma who's been sick for about a week. She has had a cough productive of white sputum, shortness of breath. She has required more frequent rescue bronchodilators. She has had subjective fevers. No chills. Her husband is sick with pneumonia. She had a flu vaccine this year. She has hoarseness.Marland Kitchen Dyspneic with any exertion. No myalgias. She had pneumonia about 3 years ago. She is followed by Dr. Marchelle Gearing for asthma and chronic lung nodule. She's had no chest pain. Her appetite has been poor. She's had no vomiting or diarrhea.  Also, she reports worsening "arthritis" in her left hip. She has been unable to ambulate much and has been using a family members walker. She has not fallen recently.  Past Medical History  Diagnosis Date  . Asthma   . Hypertension   . Arthritis     Meds: See med rec  Allergies: Tetanus toxoid History   Social History  . Marital Status: Married    Spouse Name: N/A    Number of Children: N/A  . Years of Education: N/A   Occupational History  . self employed    Social History Main Topics  . Smoking status: Never Smoker   . Smokeless tobacco: Not on file  . Alcohol Use: No  . Drug Use: No  . Sexually Active: Yes    Birth Control/ Protection: Post-menopausal   Other Topics Concern  . Not on file   Social History Narrative  . No narrative on file   Family History  Problem Relation Age of Onset  . Hepatitis Mother     C  . Hypertension Mother   . Coronary artery disease Father   . Obesity Father   . Hypertension Father     hypoglycemic   Past Surgical History  Procedure Date  . Appendectomy   . Knee arthroscopy     Review  of Systems: Systems reviewed and as per HPI, otherwise negative.  Physical Exam: Blood pressure 136/49, pulse 90, temperature 98.7 F (37.1 C), temperature source Oral, resp. rate 24, height 5\' 4"  (1.626 m), weight 144.244 kg (318 lb), SpO2 93.00%. BP 132/48  Pulse 77  Temp 98.1 F (36.7 C) (Oral)  Resp 20  Ht 5\' 4"  (1.626 m)  Wt 144.244 kg (318 lb)  BMI 54.58 kg/m2  SpO2 94%  General Appearance:    obese white female with frequent coughing spells. Able to speak in full sentences.   Head:    Normocephalic, without obvious abnormality, atraumatic  Eyes:    PERRL, conjunctiva/corneas clear, EOM's intact, fundi    benign, both eyes  Ears:    Normal TM's and external ear canals, both ears  Nose:   Nares normal, septum midline, mucosa normal, no drainage    or sinus tenderness  Throat:   Lips, mucosa, and tongue normal; teeth and gums normal  Neck:   Supple, symmetrical, trachea midline, no adenopathy;    thyroid:  no enlargement/tenderness/nodules; no carotid   bruit or JVD  Back:     Symmetric, no curvature, ROM normal, no CVA tenderness  Lungs:     coarse with rhonchi and wheeze. No rales.   Chest Wall:    No tenderness or deformity  Heart:    Regular rate and rhythm, S1 and S2 normal, no murmur, rub   or gallop     Abdomen:     obese, Soft, non-tender, bowel sounds active  Genitalia:   deferred   Rectal:   deferred   Extremities:   Extremities normal, atraumatic, no cyanosis or edema  Pulses:   2+ and symmetric all extremities  Skin:   Skin color, texture, turgor normal, no rashes or lesions  Lymph nodes:   Cervical, supraclavicular, and axillary nodes normal  Neurologic:   CNII-XII intact, normal strength, sensation and reflexes    throughout    Psychiatric: Normal affect  Lab results: Basic Metabolic Panel:  Basename 05/08/12 1229  NA 139  K 3.6  CL 101  CO2 26  GLUCOSE 135*  BUN 31*  CREATININE 1.34*  CALCIUM 9.3  MG --  PHOS --   Liver Function Tests: No  results found for this basename: AST:2,ALT:2,ALKPHOS:2,BILITOT:2,PROT:2,ALBUMIN:2 in the last 72 hours No results found for this basename: LIPASE:2,AMYLASE:2 in the last 72 hours No results found for this basename: AMMONIA:2 in the last 72 hours CBC:  Basename 05/08/12 1229  WBC 18.7*  NEUTROABS --  HGB 12.4  HCT 38.2  MCV 84.7  PLT 263    Imaging results:  Dg Chest 2 View  05/08/2012  *RADIOLOGY REPORT*  Clinical Data: Cough for 1 week.  CHEST - 2 VIEW  Comparison: CT chest 09/09/2009 and chest radiograph 11/05/2008.  Findings: Trachea is midline.  Heart size is at the upper limits of normal.  There is patchy air space disease in the right mid and lower lung zones.  Lungs are hyperinflated.  No definite pleural fluid.  Degenerative changes are seen in the spine.  IMPRESSION: Right lower lobe pneumonia.  Follow-up to clearing is recommended.   Original Report Authenticated By: Leanna Battles, M.D.     Assessment & Plan: Principal Problem:  *CAP (community acquired pneumonia) Active Problems:  Intrinsic asthma, unspecified  Asthma exacerbation  Obesity, unspecified  PULMONARY NODULE  Left hip pain  Will admit to MedSurg. She has received Rocephin and azithromycin. I will continue this. Add Mucinex DM. Oxygen. Give a dose of steroids today and monitor. Continue Advair, bronchodilators, oxygen. Will x-ray her hip. Get a physical therapy evaluation.  Uriel Dowding L 05/08/2012, 2:20 PM

## 2012-05-08 NOTE — ED Provider Notes (Signed)
History    This chart was scribed for Kaylee Razor, MD, MD by Smitty Pluck, ED Scribe. The patient was seen in room APA19 and the patient's care was started at 1:01 PM.   CSN: [id number]  Arrival date & time 05/08/12  1057      Chief Complaint  Patient presents with  . Cough    (Consider location/radiation/quality/duration/timing/severity/associated sxs/prior treatment) Patient is a 67 y.o. female presenting with cough. The history is provided by the patient. No language interpreter was used.  Cough This is a new problem. The current episode started more than 2 days ago. The problem occurs constantly. The problem has not changed since onset.The cough is productive of sputum. Associated symptoms include shortness of breath.   Kaylee [name] is a 67 y.o. female with hx of COPD who presents to the Emergency Department complaining of constant, moderate productive cough with yellow sputum onset 1 week ago. Pt reports that she has SOB. She states that SOB is aggravated by exertion.   Past Medical History  Diagnosis Date  . Asthma   . Hypertension   . Arthritis     Past Surgical History  Procedure Date  . Appendectomy   . Knee arthroscopy     Family History  Problem Relation Age of Onset  . Hepatitis Mother     C  . Hypertension Mother   . Coronary artery disease Father   . Obesity Father   . Hypertension Father     hypoglycemic    History  Substance Use Topics  . Smoking status: Never Smoker   . Smokeless tobacco: Not on file  . Alcohol Use: No    OB History    Grav Para Term Preterm Abortions TAB SAB Ect Mult Living                  Review of Systems  Respiratory: Positive for cough and shortness of breath.   All other systems reviewed and are negative.    Allergies  Tetanus toxoid  Home Medications   Current Outpatient Rx  Name  Route  Sig  Dispense  Refill  . ALBUTEROL SULFATE HFA 108 (90 BASE) MCG/ACT IN AERS   Inhalation   Inhale 2 puffs  into the lungs every 6 (six) hours as needed.   1 Inhaler   5   . DICLOFENAC SODIUM 75 MG PO TBEC   Oral   Take 75 mg by mouth 2 (two) times daily as needed.          Marland Kitchen FLUTICASONE-SALMETEROL 100-50 MCG/DOSE IN AEPB   Inhalation   Inhale 1 puff into the lungs 2 (two) times daily.   2 each   0   . FUROSEMIDE 40 MG PO TABS   Oral   Take 40 mg by mouth as needed.           Marland Kitchen LISINOPRIL 20 MG PO TABS   Oral   Take 20 mg by mouth daily.           Marland Kitchen LOSARTAN POTASSIUM 100 MG PO TABS   Oral   Take 100 mg by mouth daily.         . NEBIVOLOL HCL 5 MG PO TABS   Oral   Take 5 mg by mouth daily.         Marland Kitchen POTASSIUM CHLORIDE 10 MEQ PO TBCR   Oral   Take 10 mEq by mouth as needed.  BP 136/49  Pulse 90  Temp 98.7 F (37.1 C) (Oral)  Resp 24  Ht 5\' 4"  (1.626 m)  Wt 318 lb (144.244 kg)  BMI 54.58 kg/m2  SpO2 93%  Physical Exam  Nursing note and vitals reviewed. Constitutional: She appears well-developed and well-nourished. No distress.  HENT:  Head: Normocephalic and atraumatic.  Eyes: Conjunctivae normal are normal. Right eye exhibits no discharge. Left eye exhibits no discharge.  Neck: Neck supple.  Cardiovascular: Normal rate, regular rhythm and normal heart sounds.  Exam reveals no gallop and no friction rub.   No murmur heard. Pulmonary/Chest: Effort normal. No respiratory distress. She has wheezes (expiratory bilaterally).  Abdominal: Soft. She exhibits no distension. There is no tenderness.       Obese   Musculoskeletal: She exhibits no edema and no tenderness.  Neurological: She is alert.  Skin: Skin is warm and dry.  Psychiatric: She has a normal mood and affect. Her behavior is normal. Thought content normal.    ED Course  Procedures (including critical care time) DIAGNOSTIC STUDIES: Oxygen Saturation is 93% on [place], adequate by my interpretation.    COORDINATION OF CARE: 1:04 PM Discussed ED treatment with pt     Labs Reviewed    CBC - Abnormal; Notable for the following:    WBC 18.7 (*)     All other components within normal limits  BASIC METABOLIC PANEL   Dg Chest 2 View  05/08/2012  *RADIOLOGY REPORT*  Clinical Data: Cough for 1 week.  CHEST - 2 VIEW  Comparison: CT chest 09/09/2009 and chest radiograph 11/05/2008.  Findings: Trachea is midline.  Heart size is at the upper limits of normal.  There is patchy air space disease in the right mid and lower lung zones.  Lungs are hyperinflated.  No definite pleural fluid.  Degenerative changes are seen in the spine.  IMPRESSION: Right lower lobe pneumonia.  Follow-up to clearing is recommended.   Original Report Authenticated By: Leanna Battles, M.D.      1. CAP (community acquired pneumonia)   2. Asthma exacerbation   3. Left hip pain   4. Obesity, unspecified       MDM  67 year old female with cough and dyspnea. Chest x-ray significant for right pneumonia. Patient is hypoxic on room air. She does not have a home O2 requirement. Will admit. Antibiotics ordered for community acquired pneumonia. Discussed with hospitalist for admission.     I personally preformed the services scribed in my presence. The recorded information has been reviewed is accurate. Kaylee Razor, MD.    Kaylee Razor, MD 05/08/12 [phone]

## 2012-05-09 ENCOUNTER — Observation Stay (HOSPITAL_COMMUNITY): Payer: Medicare Other

## 2012-05-09 DIAGNOSIS — N179 Acute kidney failure, unspecified: Secondary | ICD-10-CM

## 2012-05-09 DIAGNOSIS — M1610 Unilateral primary osteoarthritis, unspecified hip: Secondary | ICD-10-CM

## 2012-05-09 DIAGNOSIS — M169 Osteoarthritis of hip, unspecified: Secondary | ICD-10-CM

## 2012-05-09 LAB — BASIC METABOLIC PANEL
BUN: 44 mg/dL — ABNORMAL HIGH (ref 6–23)
GFR calc Af Amer: 23 mL/min — ABNORMAL LOW (ref >90)
GFR calc non Af Amer: 20 mL/min — ABNORMAL LOW (ref >90)
Potassium: 4 mEq/L (ref 3.5–5.1)
Sodium: 140 mEq/L (ref 135–145)

## 2012-05-09 LAB — CBC
Hemoglobin: 11.4 g/dL — ABNORMAL LOW (ref 12.0–15.0)
MCHC: 32.7 g/dL (ref 30.0–36.0)

## 2012-05-09 MED ORDER — GUAIFENESIN 100 MG/5ML PO SOLN
5.0000 mL | ORAL | Status: DC | PRN
  Administered 2012-05-09 – 2012-05-10 (×2): 100 mg via ORAL
  Filled 2012-05-09 (×3): qty 5

## 2012-05-09 NOTE — Progress Notes (Signed)
UR Chart Review Completed  

## 2012-05-09 NOTE — Care Management Note (Unsigned)
    Page 1 of 1   05/09/2012     2:16:54 PM   CARE MANAGEMENT NOTE 05/09/2012  Patient:  [facility]   Account Number:  [id number]  Date Initiated:  05/09/2012  Documentation initiated by:  Rosemary Holms  Subjective/Objective Assessment:   Pt admitted from home where she lives with spouse. No anticipated HH however pt did select [facility] if HH needed     Action/Plan:   Anticipated DC Date:  05/10/2012   Anticipated DC Plan:  HOME/SELF CARE      DC Planning Services  CM consult      Choice offered to / List presented to:             Status of service:  In process, will continue to follow Medicare Important Message given?   (If response is "NO", the following Medicare IM given date fields will be blank) Date Medicare IM given:   Date Additional Medicare IM given:    Discharge Disposition:    Per UR Regulation:    If discussed at Long Length of Stay Meetings, dates discussed:    Comments:  05/09/12 Rosemary Holms RN BSN CM

## 2012-05-09 NOTE — Progress Notes (Signed)
Subjective: Feels a little better. Dry mouth. Still dyspneic with exertion.  Objective: Vital signs in last 24 hours: Filed Vitals:   05/08/12 1939 05/08/12 2107 05/09/12 0539 05/09/12 0823  BP:  182/52 144/61   Pulse:  80 73   Temp:  98 F (36.7 C) 98.2 F (36.8 C)   TempSrc:  Oral Oral   Resp:  19 22   Height:      Weight:      SpO2: 96% 96% 97% 88%   Weight change:   Intake/Output Summary (Last 24 hours) at 05/09/12 1008 Last data filed at 05/09/12 0800  Gross per 24 hour  Intake 2588.75 ml  Output      0 ml  Net 2588.75 ml   General: Occasional cough. Talkative. HEENT: Dry mucous membranes Lungs: Rhonchi, slight wheeze, moderate air movement Abdomen obese soft nontender Extremities no pitting edema Skin dry with poor turgor  Lab Results: Basic Metabolic Panel:  Lab 05/09/12 1478 05/08/12 1229  NA 140 139  K 4.0 3.6  CL 102 101  CO2 25 26  GLUCOSE 134* 135*  BUN 44* 31*  CREATININE 2.42* 1.34*  CALCIUM 8.7 9.3  MG -- --  PHOS -- --   Liver Function Tests: No results found for this basename: AST:2,ALT:2,ALKPHOS:2,BILITOT:2,PROT:2,ALBUMIN:2 in the last 168 hours No results found for this basename: LIPASE:2,AMYLASE:2 in the last 168 hours No results found for this basename: AMMONIA:2 in the last 168 hours CBC:  Lab 05/09/12 0454 05/08/12 1229  WBC 17.8* 18.7*  NEUTROABS -- --  HGB 11.4* 12.4  HCT 34.9* 38.2  MCV 83.9 84.7  PLT 255 263   Cardiac Enzymes: No results found for this basename: CKTOTAL:3,CKMB:3,CKMBINDEX:3,TROPONINI:3 in the last 168 hours BNP: No results found for this basename: PROBNP:3 in the last 168 hours D-Dimer: No results found for this basename: DDIMER:2 in the last 168 hours CBG: No results found for this basename: GLUCAP:6 in the last 168 hours Hemoglobin A1C: No results found for this basename: HGBA1C in the last 168 hours Fasting Lipid Panel: No results found for this basename: CHOL,HDL,LDLCALC,TRIG,CHOLHDL,LDLDIRECT  in the last 168 hours Thyroid Function Tests: No results found for this basename: TSH,T4TOTAL,FREET4,T3FREE,THYROIDAB in the last 168 hours Coagulation: No results found for this basename: LABPROT:4,INR:4 in the last 168 hours Anemia Panel: No results found for this basename: VITAMINB12,FOLATE,FERRITIN,TIBC,IRON,RETICCTPCT in the last 168 hours Urine Drug Screen: Drugs of Abuse  No results found for this basename: labopia, cocainscrnur, labbenz, amphetmu, thcu, labbarb    Alcohol Level: No results found for this basename: ETH:2 in the last 168 hours Urinalysis: No results found for this basename: COLORURINE:2,APPERANCEUR:2,LABSPEC:2,PHURINE:2,GLUCOSEU:2,HGBUR:2,BILIRUBINUR:2,KETONESUR:2,PROTEINUR:2,UROBILINOGEN:2,NITRITE:2,LEUKOCYTESUR:2 in the last 168 hours   Micro Results: No results found for this or any previous visit (from the past 240 hour(s)). Studies/Results: Dg Chest 2 View  05/08/2012  *RADIOLOGY REPORT*  Clinical Data: Cough for 1 week.  CHEST - 2 VIEW  Comparison: CT chest 09/09/2009 and chest radiograph 11/05/2008.  Findings: Trachea is midline.  Heart size is at the upper limits of normal.  There is patchy air space disease in the right mid and lower lung zones.  Lungs are hyperinflated.  No definite pleural fluid.  Degenerative changes are seen in the spine.  IMPRESSION: Right lower lobe pneumonia.  Follow-up to clearing is recommended.   Original Report Authenticated By: Leanna Battles, M.D.    Dg Hip Complete Left  05/09/2012  *RADIOLOGY REPORT*  Clinical Data: Pain  LEFT HIP - COMPLETE 2+ VIEW  Comparison: None.  Findings: There is advanced osteoarthritis of the left hip with complete loss of joint space, flattening of the femoral head and acetabular osteophytes.  There are mild osteoarthritic changes of the right hip.  Other bones of the pelvis appear unremarkable.  IMPRESSION: Advanced osteoarthritis of the left hip.   Original Report Authenticated By: Paulina Fusi, M.D.     Scheduled Meds:   . albuterol  2.5 mg Nebulization Q4H while awake  . antiseptic oral rinse  15 mL Mouth Rinse BID  . azithromycin  500 mg Intravenous Q24H  . cefTRIAXone (ROCEPHIN)  IV  1 g Intravenous Q24H  . dextromethorphan-guaiFENesin  1 tablet Oral BID  . heparin  5,000 Units Subcutaneous Q8H  . ipratropium  0.5 mg Nebulization QID  . mometasone-formoterol  2 puff Inhalation BID  . nebivolol  5 mg Oral Daily  . pneumococcal 23 valent vaccine  0.5 mL Intramuscular Tomorrow-1000   Continuous Infusions:   . 0.9 % NaCl with KCl 20 mEq / L 75 mL/hr at 05/09/12 0602   PRN Meds:.acetaminophen, acetaminophen, alum & mag hydroxide-simeth, guaiFENesin, HYDROcodone-acetaminophen, magnesium hydroxide, ondansetron (ZOFRAN) IV, ondansetron, traZODone Assessment/Plan: Principal Problem:  *CAP (community acquired pneumonia) Active Problems:  Intrinsic asthma, unspecified  Asthma exacerbation  Acute renal failure, likely prerenal in the setting of acute illness, ARB and NSAIDs.. Diclofenac and Cozaar. Give IV fluids. Monitor.  Obesity, unspecified  PULMONARY NODULE Severe OA (osteoarthritis) of left hip: Refer to orthopedics as an outpatient.   LOS: 1 day   Kaylee [name] L 05/09/2012, 10:08 AM

## 2012-05-09 NOTE — Plan of Care (Signed)
Problem: Phase I Progression Outcomes Goal: OOB as tolerated unless otherwise ordered Outcome: Progressing Up to chair, walked with PT

## 2012-05-09 NOTE — Evaluation (Signed)
Physical Therapy Evaluation Patient Details Name: Kaylee [name] MRN: [id number] DOB: 03-11-1946 Today's Date: 05/09/2012 Time: 1415-1500 PT Time Calculation (min): 45 min  PT Assessment / Plan / Recommendation Clinical Impression  Pt was seen for eval.  She is very pleasnat, morbidly obese with apparent end stage DJD of L hip (per pt report).  Her activities have been severely limited by joint pain and she has been needing progressively increased assist with gait, going from gait with no assist device to cane and now to a walker.  This will probably not improve until she has orthopedic intervention.   Her encurance is mildly compromised by dyspnea on exertion but she should beable to function at home.  I will ask nursing service to have pt ambulate over the weekend.  I instructed pt is self ex to L hip for maintaining joint ROM.Marland Kitchen    PT Assessment  Patent does not need any further PT services    Follow Up Recommendations  No PT follow up    Does the patient have the potential to tolerate intense rehabilitation      Barriers to Discharge        Equipment Recommendations  None recommended by PT    Recommendations for Other Services     Frequency      Precautions / Restrictions Precautions Precautions: None Restrictions Weight Bearing Restrictions: No   Pertinent Vitals/Pain       Mobility  Bed Mobility Bed Mobility: Supine to Sit;Sit to Supine Supine to Sit: 7: Independent Sit to Supine: 7: Independent Transfers Transfers: Sit to Stand;Stand to Sit Sit to Stand: 6: Modified independent (Device/Increase time);From bed;With upper extremity assist Stand to Sit: 6: Modified independent (Device/Increase time);To chair/3-in-1;To bed;With upper extremity assist Ambulation/Gait Ambulation/Gait Assistance: 6: Modified independent (Device/Increase time) Ambulation Distance (Feet): 50 Feet Assistive device: Rolling walker Gait Pattern: Antalgic;Decreased stance time -  left;Decreased step length - left General Gait Details: very slow and labored gait Stairs: No Wheelchair Mobility Wheelchair Mobility: No    Shoulder Instructions     Exercises     PT Diagnosis:    PT Problem List:   PT Treatment Interventions:     PT Goals    Visit Information  Last PT Received On: 05/09/12    Subjective Data  Subjective: my L hip is now bone on bone Patient Stated Goal: increase activities   Prior Functioning  Home Living Lives With: Spouse Available Help at Discharge: Family;Available 24 hours/day Type of Home: House Home Access: Level entry Home Layout: Two level Alternate Level Stairs-Number of Steps: 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 Alternate Level Stairs-Rails: Right Home Adaptive Equipment: Walker - rolling;Straight cane Prior Function Level of Independence: Independent with assistive device(s) Able to Take Stairs?: Yes Driving: Yes Vocation: Full time employment Communication Communication: No difficulties    Cognition  Overall Cognitive Status: Appears within functional limits for tasks assessed/performed Arousal/Alertness: Awake/alert Orientation Level: Appears intact for tasks assessed Behavior During Session: [facility] for tasks performed    Extremity/Trunk Assessment Right Lower Extremity Assessment RLE ROM/Strength/Tone: WFL for tasks assessed RLE ROM/Strength/Tone Deficits: has strong crepitus in knee  RLE Sensation: WFL - Light Touch RLE Coordination: WFL - gross motor Left Lower Extremity Assessment LLE ROM/Strength/Tone: Deficits LLE ROM/Strength/Tone Deficits: ROM is WNL but strength in hip is 2/5 LLE Sensation: WFL - Light Touch LLE Coordination: WFL - gross motor   Balance Balance Balance Assessed: No  End of Session PT - End of Session Equipment Utilized  During Treatment: Gait belt Activity Tolerance: Patient tolerated treatment well Patient left: with call bell/phone within reach;in chair Nurse  Communication: Mobility status  GP Functional Assessment Tool Used: clinical judgement Functional Limitation: Mobility: Walking and moving around Mobility: Walking and Moving Around Current Status ([id number]): 0 percent impaired, limited or restricted Mobility: Walking and Moving Around Goal Status ([id number]): 0 percent impaired, limited or restricted Mobility: Walking and Moving Around Discharge Status [phone]): 0 percent impaired, limited or restricted   Myrlene Broker L 05/09/2012, 3:09 PM

## 2012-05-10 LAB — BASIC METABOLIC PANEL
GFR calc Af Amer: 28 mL/min — ABNORMAL LOW (ref >90)
GFR calc non Af Amer: 24 mL/min — ABNORMAL LOW (ref >90)
Glucose, Bld: 108 mg/dL — ABNORMAL HIGH (ref 70–99)
Potassium: 4.3 mEq/L (ref 3.5–5.1)
Sodium: 140 mEq/L (ref 135–145)

## 2012-05-10 MED ORDER — AZITHROMYCIN 250 MG PO TABS
500.0000 mg | ORAL_TABLET | Freq: Every day | ORAL | Status: DC
  Filled 2012-05-10 (×2): qty 2

## 2012-05-10 MED ORDER — SODIUM CHLORIDE 0.9 % IV SOLN
INTRAVENOUS | Status: DC
  Administered 2012-05-10: 15:00:00 via INTRAVENOUS
  Administered 2012-05-11: 1000 mL via INTRAVENOUS

## 2012-05-10 MED ORDER — AZITHROMYCIN 250 MG PO TABS
500.0000 mg | ORAL_TABLET | Freq: Every day | ORAL | Status: DC
  Administered 2012-05-10 – 2012-05-11 (×2): 500 mg via ORAL

## 2012-05-10 NOTE — Progress Notes (Signed)
Subjective: Cough improving. Felt very weak this morning. Better now. Appetite decent.  Objective: Vital signs in last 24 hours: Filed Vitals:   05/10/12 0700 05/10/12 0732 05/10/12 1043 05/10/12 1053  BP: 124/76  145/45   Pulse: 60     Temp: 98.1 F (36.7 C)     TempSrc: Oral     Resp:      Height:      Weight:      SpO2: 96% 96%  96%   Weight change:   Intake/Output Summary (Last 24 hours) at 05/10/12 1236 Last data filed at 05/10/12 0809  Gross per 24 hour  Intake 3287.5 ml  Output     25 ml  Net 3262.5 ml   General: Occasional cough. Talkative. Lungs: Lungs clear to auscultation bilaterally without wheeze rhonchi or rales Abdomen obese soft nontender Extremities no pitting edema Cardiovascular regular rate rhythm without murmurs gallops rubs  Lab Results: Basic Metabolic Panel:  Lab 05/10/12 1610 05/09/12 0454  NA 140 140  K 4.3 4.0  CL 106 102  CO2 25 25  GLUCOSE 108* 134*  BUN 45* 44*  CREATININE 2.07* 2.42*  CALCIUM 8.5 8.7  MG -- --  PHOS -- --   Liver Function Tests: No results found for this basename: AST:2,ALT:2,ALKPHOS:2,BILITOT:2,PROT:2,ALBUMIN:2 in the last 168 hours No results found for this basename: LIPASE:2,AMYLASE:2 in the last 168 hours No results found for this basename: AMMONIA:2 in the last 168 hours CBC:  Lab 05/09/12 0454 05/08/12 1229  WBC 17.8* 18.7*  NEUTROABS -- --  HGB 11.4* 12.4  HCT 34.9* 38.2  MCV 83.9 84.7  PLT 255 263   Cardiac Enzymes: No results found for this basename: CKTOTAL:3,CKMB:3,CKMBINDEX:3,TROPONINI:3 in the last 168 hours BNP: No results found for this basename: PROBNP:3 in the last 168 hours D-Dimer: No results found for this basename: DDIMER:2 in the last 168 hours CBG: No results found for this basename: GLUCAP:6 in the last 168 hours Hemoglobin A1C: No results found for this basename: HGBA1C in the last 168 hours Fasting Lipid Panel: No results found for this basename:  CHOL,HDL,LDLCALC,TRIG,CHOLHDL,LDLDIRECT in the last 960 hours Thyroid Function Tests: No results found for this basename: TSH,T4TOTAL,FREET4,T3FREE,THYROIDAB in the last 168 hours Coagulation: No results found for this basename: LABPROT:4,INR:4 in the last 168 hours Anemia Panel: No results found for this basename: VITAMINB12,FOLATE,FERRITIN,TIBC,IRON,RETICCTPCT in the last 168 hours Urine Drug Screen: Drugs of Abuse  No results found for this basename: labopia,  cocainscrnur,  labbenz,  amphetmu,  thcu,  labbarb    Alcohol Level: No results found for this basename: ETH:2 in the last 168 hours Urinalysis: No results found for this basename: COLORURINE:2,APPERANCEUR:2,LABSPEC:2,PHURINE:2,GLUCOSEU:2,HGBUR:2,BILIRUBINUR:2,KETONESUR:2,PROTEINUR:2,UROBILINOGEN:2,NITRITE:2,LEUKOCYTESUR:2 in the last 168 hours   Micro Results: No results found for this or any previous visit (from the past 240 hour(s)). Studies/Results: Dg Hip Complete Left  05/09/2012  *RADIOLOGY REPORT*  Clinical Data: Pain  LEFT HIP - COMPLETE 2+ VIEW  Comparison: None.  Findings: There is advanced osteoarthritis of the left hip with complete loss of joint space, flattening of the femoral head and acetabular osteophytes.  There are mild osteoarthritic changes of the right hip.  Other bones of the pelvis appear unremarkable.  IMPRESSION: Advanced osteoarthritis of the left hip.   Original Report Authenticated By: Paulina Fusi, M.D.    Scheduled Meds:    . albuterol  2.5 mg Nebulization Q4H while awake  . antiseptic oral rinse  15 mL Mouth Rinse BID  . azithromycin  500 mg Intravenous Q24H  .  cefTRIAXone (ROCEPHIN)  IV  1 g Intravenous Q24H  . dextromethorphan-guaiFENesin  1 tablet Oral BID  . heparin  5,000 Units Subcutaneous Q8H  . ipratropium  0.5 mg Nebulization QID  . mometasone-formoterol  2 puff Inhalation BID  . nebivolol  5 mg Oral Daily   Continuous Infusions:    . 0.9 % NaCl with KCl 20 mEq / [name] 125 mL/hr at  05/10/12 0504   PRN Meds:.acetaminophen, acetaminophen, alum & mag hydroxide-simeth, guaiFENesin, HYDROcodone-acetaminophen, magnesium hydroxide, ondansetron (ZOFRAN) IV, ondansetron, traZODone Assessment/Plan: Principal Problem:  *CAP (community acquired pneumonia) Active Problems:  Intrinsic asthma, unspecified  Asthma exacerbation  Acute renal failure, likely prerenal in the setting of acute illness, ARB and NSAIDs.. Diclofenac and Cozaar. Give IV fluids. Monitor.  Obesity, unspecified  PULMONARY NODULE Severe OA (osteoarthritis) of left hip: Refer to orthopedics as an outpatient.  Creatinine improving. Cough improving. Less wheeze. Change azithromycin to by mouth. Hopefully, home in a day or 2 if she continues to improve.   LOS: 2 days   Kaylee [name] 05/10/2012, 12:36 PM

## 2012-05-11 DIAGNOSIS — N898 Other specified noninflammatory disorders of vagina: Secondary | ICD-10-CM

## 2012-05-11 DIAGNOSIS — N939 Abnormal uterine and vaginal bleeding, unspecified: Secondary | ICD-10-CM | POA: Diagnosis present

## 2012-05-11 LAB — CBC
HCT: 32.9 % — ABNORMAL LOW (ref 36.0–46.0)
Hemoglobin: 10.5 g/dL — ABNORMAL LOW (ref 12.0–15.0)
MCV: 85.5 fL (ref 78.0–100.0)
RBC: 3.85 MIL/uL — ABNORMAL LOW (ref 3.87–5.11)
WBC: 14 10*3/uL — ABNORMAL HIGH (ref 4.0–10.5)

## 2012-05-11 LAB — BASIC METABOLIC PANEL
CO2: 28 mEq/L (ref 19–32)
Chloride: 107 mEq/L (ref 96–112)
GFR calc Af Amer: 45 mL/min — ABNORMAL LOW (ref >90)
Potassium: 4.5 mEq/L (ref 3.5–5.1)

## 2012-05-11 MED ORDER — ALBUTEROL SULFATE HFA 108 (90 BASE) MCG/ACT IN AERS: 2.0000 | INHALATION_SPRAY | RESPIRATORY_TRACT | Status: AC | PRN

## 2012-05-11 MED ORDER — CEFUROXIME AXETIL 500 MG PO TABS: 500.0000 mg | ORAL_TABLET | Freq: Two times a day (BID) | ORAL | Status: DC

## 2012-05-11 MED ORDER — ACETAMINOPHEN 325 MG PO TABS: 650.0000 mg | ORAL_TABLET | Freq: Four times a day (QID) | ORAL | Status: DC | PRN

## 2012-05-11 MED ORDER — AZITHROMYCIN 500 MG PO TABS: 500.0000 mg | ORAL_TABLET | Freq: Every day | ORAL | Status: DC

## 2012-05-11 MED ORDER — DM-GUAIFENESIN ER 30-600 MG PO TB12: 1.0000 | ORAL_TABLET | Freq: Two times a day (BID) | ORAL | Status: AC

## 2012-05-11 MED ORDER — TRAMADOL HCL 50 MG PO TABS: 50.0000 mg | ORAL_TABLET | Freq: Four times a day (QID) | ORAL | Status: DC | PRN

## 2012-05-11 NOTE — Discharge Summary (Signed)
Physician Discharge Summary  Patient ID: Kaylee [name] MRN: [id number] DOB/AGE: 65-Dec-1947 65 y.o.  Admit date: 05/08/2012 Discharge date: 05/11/2012  Discharge Diagnoses:  Principal Problem:  *CAP (community acquired pneumonia) Active Problems:  Intrinsic asthma, unspecified  Asthma exacerbation  Acute renal failure  Obesity, unspecified  PULMONARY NODULE  OA (osteoarthritis) of hip  Vaginal bleeding     Medication List     As of 05/11/2012 10:30 AM    STOP taking these medications         diclofenac 75 MG EC tablet   Commonly known as: VOLTAREN      losartan 100 MG tablet   Commonly known as: COZAAR      TAKE these medications         acetaminophen 325 MG tablet   Commonly known as: TYLENOL   Take 2 tablets (650 mg total) by mouth every 6 (six) hours as needed (or Fever >/= 101).      albuterol 108 (90 BASE) MCG/ACT inhaler   Commonly known as: PROVENTIL HFA;VENTOLIN HFA   Inhale 2 puffs into the lungs every 4 (four) hours as needed for wheezing or shortness of breath. Shortness of breath      azithromycin 500 MG tablet   Commonly known as: ZITHROMAX   Take 1 tablet (500 mg total) by mouth daily.      cefUROXime 500 MG tablet   Commonly known as: CEFTIN   Take 1 tablet (500 mg total) by mouth 2 (two) times daily.      dextromethorphan-guaiFENesin 30-600 MG per 12 hr tablet   Commonly known as: MUCINEX DM   Take 1 tablet by mouth 2 (two) times daily. For cough      Fluticasone-Salmeterol 100-50 MCG/DOSE Aepb   Commonly known as: ADVAIR   Inhale 1 puff into the lungs 2 (two) times daily.      furosemide 40 MG tablet   Commonly known as: LASIX   Take 40 mg by mouth as needed. Fluid retention      nebivolol 10 MG tablet   Commonly known as: BYSTOLIC   Take 5 mg by mouth daily.      potassium chloride 10 MEQ CR tablet   Commonly known as: KLOR-CON   Take 10 mEq by mouth as needed. Take with fluid pill      traMADol 50 MG tablet   Commonly known as:  ULTRAM   Take 1 tablet (50 mg total) by mouth every 6 (six) hours as needed for pain.            Discharge Orders    Future Orders Please Complete By Expires   Diet - low sodium heart healthy      Walker          Follow-up Information    Follow up with VYAS,DHRUV B., MD. In 2 weeks. (to check blood pressure and kidney function)    Contact information:   [address] [place] [address] [phone]       Follow up with Fuller Canada, MD. In 1 month. (for hip arthritis)    Contact information:   [address] [place] [address] [phone]       Follow up with your gynecologist. In 1 month.         Disposition: Home  Discharged Condition: Stable  Consults:   physical therapy  Labs:    Sodium  139  140   140 142   Potassium  3.6  4.0   4.3 4.5   Chloride  101  102   106 107   CO2  26  25   25 28    BUN  31  44   45 31   Creatinine, Ser  1.34  2.42    2.07 1.39   Calcium  9.3  8.7   8.5 8.7   GFR calc non Af Amer  41  20   24 39   GFR calc Af Amer  47   23    28  45    Glucose, Bld  135  134   108 123    CBC    WBC  18.7  17.8    14.0   RBC  4.51  4.16    3.85   Hemoglobin  12.4  11.4    10.5   HCT  38.2  34.9    32.9   MCV  84.7  83.9    85.5   MCH  27.5  27.4    27.3   MCHC  32.5  32.7    31.9   RDW  14.2  14.3    14.6   Platelets  263  255    259    DIABETES    Glucose, Bld  135  134   108 123    VIRAL TESTS    Influenza A By PCR   NEGATIVE        Influenza B By PCR   NEGATIVE        H1N1 flu by pcr   NOT DETECTED          URINE, OTHER    Strep Pneumo Urinary Antigen      NEGATIVE     Diagnostics:  Dg Chest 2 View  05/08/2012  *RADIOLOGY REPORT*  Clinical Data: Cough for 1 week.  CHEST - 2 VIEW  Comparison: CT chest 09/09/2009 and chest radiograph 11/05/2008.  Findings: Trachea is midline.  Heart size is at the upper limits of normal.  There is patchy air space disease in the right  mid and lower lung zones.  Lungs are hyperinflated.  No definite pleural fluid.  Degenerative changes are seen in the spine.  IMPRESSION: Right lower lobe pneumonia.  Follow-up to clearing is recommended.   Original Report Authenticated By: Leanna Battles, M.D.    Dg Hip Complete Left  05/09/2012  *RADIOLOGY REPORT*  Clinical Data: Pain  LEFT HIP - COMPLETE 2+ VIEW  Comparison: None.  Findings: There is advanced osteoarthritis of the left hip with complete loss of joint space, flattening of the femoral head and acetabular osteophytes.  There are mild osteoarthritic changes of the right hip.  Other bones of the pelvis appear unremarkable.  IMPRESSION: Advanced osteoarthritis of the left hip.   Original Report Authenticated By: Paulina Fusi, M.D.    Full Code   Hospital Course: See H&P for complete admission details. The patient is a 67 year old white female with history of asthma who presented with cough and shortness of breath. She had been using her inhalers more frequently. Her husband is sick with pneumonia. She also complained of worsening left arthritis pain in her hip. In the emergency room, she had normal vital signs except slight tachypnea. She was noted to be coughing but able to speak in full sentences. She had coarse breath sounds with rhonchi and wheeze. She was morbidly obese. Chest x-ray showed right lower lobe pneumonia.  Patient was admitted, started on antibiotics, bronchodilators.  She is given a single dose of prednisone. She worked with physical therapy. X-ray of her hip showed severe degenerative osteoarthritis of the left hip. She already has a walker at home. During the hospitalization, her creatinine did bump. Therefore her low certain and diclofenac should not be resumed acute leg. With hydration, her creatinine has decreased. Her lungs are now clear. She still has a significant cough. She is eating, ambulating and feeling better. She will be referred to orthopedics as an outpatient.  She also reported some vaginal bleeding which started about a month prior to admission. She reports that this is been a problem in the past and she will followup with her gynecologist. Total time on the day of discharge greater than 30 minutes.  Discharge Exam:  Blood pressure 142/75, pulse 75, temperature 98.5 F (36.9 C), temperature source Oral, resp. rate 20, height 5\' 4"  (1.626 m), weight 144.244 kg (318 lb), SpO2 96.00%.  Lungs clear to auscultation bilaterally without wheeze rhonchi or rales Cardiovascular regular rate rhythm without murmurs gallops rubs Extremities no edema  Signed: Kriss Ishler L 05/11/2012, 10:30 AM

## 2012-05-11 NOTE — Progress Notes (Signed)
Patient discharged home with family.  IV removed - WNL.  Instructed to make follow up appts. With PCP and ortho.  Instructed on new medications.  Pt states she already has a walker at home to use.  Educated on and given information on cardiac/low NA diet.  Verbalizes understanding of instructions.  Stable to discharge

## 2012-05-14 LAB — LEGIONELLA ANTIGEN, URINE
Legionella Antigen, Urine: NEGATIVE
Legionella Antigen, Urine: NEGATIVE

## 2012-06-11 ENCOUNTER — Encounter: Payer: Self-pay | Admitting: Orthopedic Surgery

## 2012-06-11 ENCOUNTER — Ambulatory Visit (INDEPENDENT_AMBULATORY_CARE_PROVIDER_SITE_OTHER): Payer: Medicare Other | Admitting: Orthopedic Surgery

## 2012-06-11 VITALS — BP 180/90 | Ht 64.0 in | Wt 306.0 lb

## 2012-06-11 DIAGNOSIS — J189 Pneumonia, unspecified organism: Secondary | ICD-10-CM

## 2012-06-11 DIAGNOSIS — M169 Osteoarthritis of hip, unspecified: Secondary | ICD-10-CM

## 2012-06-11 MED ORDER — TRAMADOL HCL 50 MG PO TABS: 50.0000 mg | ORAL_TABLET | Freq: Four times a day (QID) | ORAL | Status: DC | PRN

## 2012-06-11 NOTE — Progress Notes (Signed)
Patient ID: Kaylee [name], female   DOB: 18-Sep-1945, 67 y.o.   MRN: [id number] Chief Complaint  Patient presents with  . Hip Pain    Referral from Dr. Lendell Caprice for left hip pain. No injury.     This patient was recently discharged from the hospital for pneumonia and presents as a referral by Dr. Lendell Caprice our local hospital is with a bad left hip. She has had gradually increasing pain in her left hip for the last 5-6 months partially relieved with anti-inflammatory medication and tramadol. She has sharp dull pain in her left thigh and groin which is worse with walking. She noticed of the last 6 months and she was grabbing onto things including furniture and whatever she could to hold herself up until she was able to obtain a walker.  She is issues shortness of breath and wheezing with snoring secondary to her COPD she is an unsteady gait some joint pain and feeling to joint instability secondary to osteoarthritis of the knee the other 11 review of systems were normal  Her medical history includes COPD and hypertension she is on the following medications Current outpatient prescriptions:CARVEDILOL PO, Take by mouth., Disp: , Rfl: ;  Fluticasone-Salmeterol (ADVAIR) 100-50 MCG/DOSE AEPB, Inhale 1 puff into the lungs 2 (two) times daily., Disp: 2 each, Rfl: 0;  furosemide (LASIX) 40 MG tablet, Take 40 mg by mouth as needed. Fluid retention, Disp: , Rfl: ;  Ibuprofen 200 MG CAPS, Take by mouth., Disp: , Rfl: ;  Mometasone Furo-Formoterol Fum (DULERA IN), Inhale into the lungs., Disp: , Rfl:  nebivolol (BYSTOLIC) 10 MG tablet, Take 5 mg by mouth daily., Disp: , Rfl: ;  potassium chloride (KLOR-CON) 10 MEQ CR tablet, Take 10 mEq by mouth as needed. Take with fluid pill, Disp: , Rfl: ;  acetaminophen (TYLENOL) 325 MG tablet, Take 2 tablets (650 mg total) by mouth every 6 (six) hours as needed (or Fever >/= 101)., Disp: , Rfl:  albuterol (PROVENTIL HFA;VENTOLIN HFA) 108 (90 BASE) MCG/ACT inhaler, Inhale 2  puffs into the lungs every 4 (four) hours as needed for wheezing or shortness of breath. Shortness of breath, Disp: , Rfl: ;  azithromycin (ZITHROMAX) 500 MG tablet, Take 1 tablet (500 mg total) by mouth daily., Disp: 3 each, Rfl: 0;  cefUROXime (CEFTIN) 500 MG tablet, Take 1 tablet (500 mg total) by mouth 2 (two) times daily., Disp: 10 tablet, Rfl: 0 dextromethorphan-guaiFENesin (MUCINEX DM) 30-600 MG per 12 hr tablet, Take 1 tablet by mouth 2 (two) times daily. For cough, Disp: , Rfl: ;  traMADol (ULTRAM) 50 MG tablet, Take 1 tablet (50 mg total) by mouth every 6 (six) hours as needed for pain., Disp: 90 tablet, Rfl: 5  BP 180/90  Ht 5\' 4"  (1.626 m)  Wt 306 lb (138.801 kg)  BMI 52.52 kg/m2  Physical Exam(12)  Vital signs:   GENERAL: normal development   CDV: pulses are normal   Skin: normal  Lymph: nodes were not palpable/normal  Psychiatric: awake, alert and oriented  Neuro: normal sensation  MSK  Gait: She is ambulating with a walker but struggles. She could not get on the exam table. Evaluation of the lower extremities reveals that she has bilateral pitting edema probable venous stasis disease she has painless range of motion her right and left hip of 120 but she has no internal rotation of the left and minimal on the right exam is normal hips are stable  Imaging radius of the left hip and  left pelvis show severe osteoarthritis left hip and what I believe is osteoarthritis of the right hip but the obesity and pedunculus prevents a good x-ray of the right  X-ray report also reveals advanced osteoarthritis of left hip    Assessment: 67 year-old female with recent pneumonia presents with osteoarthritis of left hip history COPD and hypertension and obesity. She is not a candidate currently for need for hip replacement surgery  Recommend nutrition consult order therapy tramadol for pain  Followup in 6 months check weight  Please send copy to primary care physician to help her  with weight loss. This is been explained to her would like to see her at 240 pounds.    Plan: Medical decision-making data interpret image hip and pelvic x-ray review hospital records and radiology reports new problem further workup planned. Recent history of pneumonia. Prescription medications. Physical therapy recommended and ordered.

## 2012-06-11 NOTE — Patient Instructions (Addendum)
You have been diagnosed with arthritis of her left hip   1. Therapy at [facility] for [facility] therapy [facility] 2. Nutrition consult for weight loss 3. Resume tramadol      Total Hip Replacement Total hip replacement is the replacement of your damaged hip with an artificial hip joint (prosthetic hip joint). The purpose of this surgery is to reduce pain and improve your hip function. LET YOUR CAREGIVER KNOW ABOUT:    Any allergies you have.   Any medicines you are taking, including vitamins, herbs, eyedrops, over-the-counter medicines, and creams.   Any problems you have had with the use of anesthetics.   Family history of problems with the use of anesthetics.   Any blood disorders you have, including bleeding problems or clotting problems.   Previous surgeries you have had.  RISKS AND COMPLICATIONS Generally, total hip replacement is a safe procedure. However, as with any surgical procedure, complications can occur. Complications associated with total hip replacement both during and after the procedure include:  Infection.   Dislocation (the ball of the hip-joint prosthesis comes out of contact with the socket).   Loosening of the stem connected to the ball or socket.   Fracture of the bone while inserting the prosthesis.   Formation of blood clots, which can break loose and travel to and injure your lungs (pulmonary embolus).  BEFORE THE PROCEDURE    Your caregiver will instruct you when you need to stop eating and drinking.   Ask your caregiver if you need to change or stop any regular medicines.  PROCEDURE Just before the procedure, you will receive medicine that will make you drowsy (sedative) or medicine to make you fall asleep (general anesthetic). This will be given through a tube that is inserted into one of your veins (intravenous [IV] tube). Then you will receive medicine to block pain from the waist down through your legs (spinal block). An incision is made in your hip.  Your surgeon will take out any damaged cartilage and bone. Next, your surgeon will insert a prosthetic socket into your pelvic bone. This is usually secured with screws. Then, your surgeon will cut off the ball of your thigh bone (femur) and attach a prosthetic ball on a stem to your femur. The surgeon then places the ball into the socket and checks the range of motion of your new hip. AFTER THE PROCEDURE   You will be taken to the recovery area where a nurse will watch and check your progress. Once you are awake and stable, you will be taken to a hospital room. You will receive physical therapy until you are doing well and your caregiver feels it is safe for you to go home. Typically, you will stay in the hospital 1 4 days after your procedure. Document Released: 07/30/2000 Document Revised: 10/23/2011 Document Reviewed: 06/10/2011 [facility] Patient Information 2013 [place], [place].

## 2012-07-09 ENCOUNTER — Telehealth: Payer: Self-pay | Admitting: Internal Medicine

## 2012-07-09 ENCOUNTER — Ambulatory Visit (INDEPENDENT_AMBULATORY_CARE_PROVIDER_SITE_OTHER)
Admission: RE | Admit: 2012-07-09 | Discharge: 2012-07-09 | Disposition: A | Discharge status: Home / Self Care | Payer: Medicare Other | Source: Ambulatory Visit | Attending: Internal Medicine | Admitting: Internal Medicine

## 2012-07-09 DIAGNOSIS — R911 Solitary pulmonary nodule: Secondary | ICD-10-CM

## 2012-07-09 NOTE — Telephone Encounter (Signed)
pleaes give fu to discuss weight and fu on pulmonary nodules on CT; likely benign

## 2012-07-14 NOTE — Telephone Encounter (Signed)
Pt already has an appt on 08-11-12. [name], CMA

## 2012-08-11 ENCOUNTER — Telehealth: Payer: Self-pay | Admitting: Internal Medicine

## 2012-08-11 ENCOUNTER — Encounter: Payer: Self-pay | Admitting: Internal Medicine

## 2012-08-11 ENCOUNTER — Ambulatory Visit (INDEPENDENT_AMBULATORY_CARE_PROVIDER_SITE_OTHER): Payer: Medicare Other | Admitting: Internal Medicine

## 2012-08-11 VITALS — BP 128/90 | HR 90 | Temp 97.7°F | Ht 64.0 in | Wt 297.4 lb

## 2012-08-11 DIAGNOSIS — R0602 Shortness of breath: Secondary | ICD-10-CM

## 2012-08-11 DIAGNOSIS — J849 Interstitial pulmonary disease, unspecified: Secondary | ICD-10-CM

## 2012-08-11 DIAGNOSIS — J984 Other disorders of lung: Secondary | ICD-10-CM

## 2012-08-11 DIAGNOSIS — E669 Obesity, unspecified: Secondary | ICD-10-CM

## 2012-08-11 DIAGNOSIS — J8410 Pulmonary fibrosis, unspecified: Secondary | ICD-10-CM

## 2012-08-11 DIAGNOSIS — R911 Solitary pulmonary nodule: Secondary | ICD-10-CM

## 2012-08-11 DIAGNOSIS — M169 Osteoarthritis of hip, unspecified: Secondary | ICD-10-CM

## 2012-08-11 NOTE — Patient Instructions (Addendum)
#Followup - 6 months with CT scan of the chest; approximately October 2014  #HIp pain  - recommended Orthopedic doctors are Dr. Teryl Lucy of [facility] [phone]. And Dr Trudee Grip and Dr Lajoyce Corners of [facility] [phone]  #Asthma - Continue Dulera 1 puff twice daily  #Lung nodule - The lung nodule  is now resolved but you have scarring in the lung on both the right and left lower lungs from pneumoniae January 2014. You need followup CT scan of the chest in September or October 2014  #Weight management - Congratulations on  losing 25 pounds since September 2013 - Additional tips are as below #WEight Management    - we discussed extensively about weight management   - follow low glycemic diet plan that I outlined for you after extensive discussion. Do not follow other plans  - General  - drink lot of water  - avoid all moderate and high glycemic foods especially bad fruits, breads, pastas, fried foods, battered foods, sugary foods  - make non-starchy vegetables your base in terms of volume you eat  - always make sure you balance good carbs, good protein and good fat source  - good carbs are non-starchy vegetables, uncanned beans in the left column and low glycemic fruits in the left colum  - good protein source is egg white, beans, tofu, fish, chicken breast, fish, [place] and bison. Remember meat has to be skinless  - good healthy fat source is nuts, and fish   - focusing on eating right healthy foods (left lane) and avoiding unhealthy foods (middle and right lane) is better way to lose weight than to go hypo-caloric  - focus on staying full by eating right  - having a daily and weekly plan for what you will eat and where you will eat depending on your work, social life schedule is very important   - watch out for misleading labels on grocery aisle: High Fiber and Low Fat labeled foods generally are high in bad carbs or sugar  - measure weight once  a  week  - discipline and attitude is key. Do not care for anyone else's opinion or feelings. Only yours matters   - For breakfast  - most important meal of the day. So, eat daily breakfast. Do not skip   - recommend 1/2 to 1 cup steel cut oat meal or 1/2 to 1 cup fiber one 60 cal   Or  1 to 1.5 cups Kashi go-lean with non-fat plain milk or 60 Cal Silk Soy mild. Can add Berries. Can have egg at same time for breakfast  - For snacks  - recommend total 2-3 snacks per day  - snack should be light and filling  - best times are between breakfast and lunch, lunch and dinner and sometimes post-dinner snack  - Nut are great snacks. Stick to low glycemic nuts (less than 50gm per day) and eat only the nuts in the left lane like peanuts, pista, almond, walnut  - Low glycemic fruits are great snacks. Have 1-2 servings each day of fruits from the left lane   - If you like yogurt or cottage cheese - recommend Oikos or Fage 0% greek yogourt or Plan non-fat yogurt or Breakstone non-fat cottage cheese. Theyse have the least sugar. Do no exceed 100-200 gram per day. Fruit yogurts are the worst  - For Lunch and dinner  - unlimited non-starchy vegetable (prefer raw fresh or roasted or grilled) with skinless  chicken or fish  - Special Notes  - Nuts: Nut are great snacks and have heart benefits. Stick to low glycemic nuts (less than 50gm per day) and eat only the nuts in the left lane like peanuts, pista, almond, walnuts  -  Ok to eat above nuts daily but only < 50gm/day  - If you eat more than 50gm/day then you run risk of eating too many calories or saturated fat  - AVoid nuts glazed with sugar. Nuts have to be in salted/original form or roasted   - Fruits: Eat 1-2 fresh fruit servings daily but fruits can be dangerous because of high sugar content. So, choose your fruits wisely. Eat only the low glycemic fruits (left lane). Eat them fresh.  Do not eat them canned  - Avoid all fresh juices except if you use the  low glycemic fruits and make them yourself without adding extra sugar   - Dairy: Is optional. Eat zero fat or low fat, fruit free yogurts or cottage cheese but not more than 100-200g per day  - Restaurant  - all restaurants have bad and good choices. Even fast food restaurants offer you good choices  - at restaurants do no fall prey to social pressure.. One way to eat healthy at restaurant is to eat healthy snack or light healthy meal before you go to restaurant so that will prevent your cravings  - Restaurants with worst choices: [place] (except [employer], or [employer]), [place], [place]. At these restaurants avoid the bread, curry, fried and battered foods and chips  - Restaurants with best choices: greek, [place], [place], [place] (again here avoid bread, deep fried stuffed and pasta)  - Restaurants with Ok choice: McDonald's, [employer], [employer] (again here avoid the bread, fried stuff, fried meat)  - Always ask for grilled meat or vegetables, and fresh salad choices (get your salad dressing as low fat and to the side)

## 2012-08-11 NOTE — Assessment & Plan Note (Signed)
Pulmonary nodule is seen in 2011 CT scan of the chest. 2014 March the that CT scan of the chest shows resolution of the nodules but she has post infectious scarring from pneumonia in January 2014. We will repeat CT scan of the chest in October 2014

## 2012-08-11 NOTE — Assessment & Plan Note (Signed)
Weight is reduced by 25 pounds and is currently at 297 pounds. Give detailed advice on diet to make further end roads into weight loss

## 2012-08-11 NOTE — Telephone Encounter (Signed)
Kaylee [name],  Please call VYAS,DHRUV B., MD patient primary care physician and please let him know that patient is on carvedilol which is not ideal in setting of  asthma because it is a non-specific beta blocker and I recommend either calcium channel blocker or beta 1 specific beta blocker such as Lopressor or bisoprolol   Thank you   Dr. Kalman Shan, M.D., [facility].C.P Pulmonary and Critical Care Medicine Staff Physician [facility] [place] [facility] Pager: [phone], If no answer or between  15:00h - 7:00h: call [phone]  08/11/2012 10:25 PM

## 2012-08-11 NOTE — Progress Notes (Signed)
Subjective:    Patient ID: Kaylee [name], female    DOB: [birth year], 67 y.o.   MRN: [id number]  HPI 67 yo never smoker ,with Asthma    OV 01/02/2010: Last seen in May 2011 and August 2011.At last visit I noticed improvement in fev1 with QVAR but dyspnea was no better. SO, lsat visit we started empiric dulera. TOday she states dulera is not helping either. STill dyspneic walking 1 flight of stairs and relieved by rest. Heat and humidity stil make her more dyspneic. Marland Kitchen No other complaints. No associated wheeze, hemoptysis, edema, fever, chils, nausea, vomit, diarrhea. She feels weight and deconditioning are main issues contributing to dyspnea. Not inclined for rehab or CPST due to knee issues and $ issues. In terms of nodule, final 24 month CT is pending in July 2012. Of note, she has lost insurance and is finding it tough to pay bills.   11/10/2010 Acute OV  Wit NP Tammy Parrrett Complains of increased SOB, some wheezing and coughing x54months.  Previous on [facility] which did help some but she could not afford due to finances . She and her husband do not work and have no income. They do not qualify for Medicaid. She does not have any medication coverage. She is due for a follow up CT chest for pulmonary nodule. She is a never smoker and last year serial follow up CT was without change. She declines at this time due to no insurance , I suggested she get a xray today however she declined.  She says she has intermittent wheezing and dry cough at times. Also some ankle edema. Blood pressure is elevated today , we discussed labs however she declined . Says she  Will go see her PCP soon to discuss this.   REC Begin Advair 100/44mcg 1 puffs Twice daily  -brush rinse and gargle after use.  We are looking into patient assistance program for Advair.  Follow up with Family doctor regarding you blood pressure.  If you change your mind about xray/CT  and labs , please call back  follow up Dr. Marchelle Gearing in 6-8  weeks and As needed     OV 9./7/12: OV for asthma, dyspnea (multifactorial) and pulmnonary nodule. REviewed NP visit notes from 11/10/10. She is getting advair for free now due to assistance program. Reports compliance. Feels it is helping overall with dyspnea, wheeze but states that some days does not feel a difference. REcently diuressed for edema with lasix but this has not made difference with dyspnea. A week ago started on anitihypertensive ACE inhibitor but unclear if this has made resp issues worse yet. She acknowledges, that obesity, DJD (antalgic gait) are contriubing to dyspnea on exertion of moderate intensity relieved by rest and that she could be deconditioned as a result. Discussed pulmonary nodule: she is categorical that she does not want imagine due to $ issues.  Past med: interim changes of lisinopril for bp and lasix for edema  Social: Financial issues. She and hsuband 1-2 years away from medicare. 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 Does not want to liquidate this  Family hx: husband suffered NSAID related Gastric ulcer - paying off $20k hospital bill.   REC #Shortness of breath  - this is due to asthma, but weight and lack of fitnesss palying a role  #ASthma  Continue advair  - have flu shot  #BP  - stop lisinopril - this can make  your asthma worse  - my nurse will call Dr Sherril Croon office now and inform of this and have them call in a generic replacement that is not a beta blocker  #Pulmonary nodule  - respect your decision not to have followup cxr and ct given all our discussions and you weighing it against cost issues  - will follow this clinically till you decide you are ready to get imaging  #followup  - 6 months or sooner if needed   OV 01/30/2012    Dyspnea (asthma, obesity, deconditioning): Still dyspneic cmpared to a year ago. No interim ER visits, prednisone, hospitalizations, antibiotics. Overall health  same. Dyspnea unchanged. Advair takes it daily (was gettingg through drug program) and she will need new samples till medicare starts in Jan 2014. However, she is unsure if advair helping. Not had but will have flu shot.  Weigh is actually worse - 20# gain since last visit  Obesity: reviewed diet. She is essentially eating chocolates, breads, and other high glycemic foods. Very little intake of low glycemic diet. Eats canned beans. Very little fresh fruit or veggies  Lung nodule: she has agreed to have CT in 2014 after she gets medicare.   Past, Family, Social reviewed: few months away from getting medicare - Jan 2014. Still paying medical bills to [place]. Husband on disability.    #Weight management  - we discussed extensively about weight management  - follow low glycemic diet plan that I outlined for you after extensive discussion  - For breakfast  - recommend steel cut oat meal or fiber one 60 cal (half to one cup) with low sugar 60 cal silk soymilk and some berries and one egg  - snack with berries or low glycemic fruit or mens health 35gm mixed-nut pack  - lunch and dinner - unlimited non-starchy vegetable (prefer raw fresh or roasted or grilled) with skinless chicken or fish  - drink lot of water  - avoid all moderate and high glycemic foods  - measure weight once A week  #Asthma  - take sample or script for albuterol as needed  - take 4 samples of advair - to last you through Feb 2014. AFter that call for script when medicare has started  - take flu shot 01/30/2012 today  #Lung nodule  - CT scan march 2014  #Followup  - march 2014 with CT chest  - lost atleast 10# by March 2014  OV 08/11/2012 Followup various issues  - Obesity: Weight  - 322# in sept 2013  - 297 # in 08/11/2012  She has lost 25 pounds. She has developed left hip osteoarthritis and is in need of a hip replacement. There for she's doing water aerobics and is motivated to lose weight. She started following my  instructions from above. She is finding it difficult but is motivated. I've encouraged her to continue to lose weight.  - Lung nodule: She had followup CT scan of the chest March 2014 for lung nodule seen in 2011. The delay was because she did not get Medicare until recently. Nodule is resolved but she has post infectious infiltrates and scarring presumably due to pneumonia admission in January 2014 and chest x-ray clearly showed right lower lobe greater than left lower lobe pneumonia. Result is as below    CT chest 07/09/12  - IMPRESSION:  1. No worrisome pulmonary nodules.  2. Bibasilar predominant peribronchovascular nodularity and  scarring in the lungs bilaterally, mildly progressive from  09/09/2009 and likely post infectious  in etiology.  Original Report Authenticated By: Leanna Battles, M.D.  -  Asthma  - This is stable on Dulera 1 puff twice daily but continues to have dyspnea for exertion due to weight and deconditioning. Noted to be on coreg. PCP is VYAS,DHRUV B., MD. Will send him note on this    Review of Systems  Constitutional: Negative for fever and unexpected weight change.  HENT: Negative for ear pain, nosebleeds, congestion, sore throat, rhinorrhea, sneezing, trouble swallowing, dental problem, postnasal drip and sinus pressure.   Eyes: Negative for redness and itching.  Respiratory: Negative for cough, chest tightness, shortness of breath and wheezing.   Cardiovascular: Negative for palpitations and leg swelling.  Gastrointestinal: Negative for nausea and vomiting.  Genitourinary: Negative for dysuria.  Musculoskeletal: Negative for joint swelling.  Skin: Negative for rash.  Neurological: Negative for headaches.  Hematological: Does not bruise/bleed easily.  Psychiatric/Behavioral: Negative for dysphoric mood. The patient is not nervous/anxious.        Objective:   Physical Exam Vitals reviewed. Constitutional: She is oriented to person, place, and time. She  appears well-developed and well-nourished. No distress.       obese  Estimated body mass index is 51.02 kg/(m^2) as calculated from the following:   Height as of this encounter: 5\' 4"  (1.626 m).   Weight as of this encounter: 297 lb 6.4 oz (134.9 kg).   Filed Weights   08/11/12 1521  Weight: 297 lb 6.4 oz (134.9 kg)    HENT:  Head: Normocephalic and atraumatic.  Right Ear: External ear normal.  Left Ear: External ear normal.  Mouth/Throat: Oropharynx is clear and moist. No oropharyngeal exudate.  Eyes: Conjunctivae and EOM are normal. Pupils are equal, round, and reactive to light. Right eye exhibits no discharge. Left eye exhibits no discharge. No scleral icterus.  Neck: Normal range of motion. Neck supple. No JVD present. No tracheal deviation present. No thyromegaly present.  Cardiovascular: Normal rate, regular rhythm, normal heart sounds and intact distal pulses.  Exam reveals no gallop and no friction rub.   No murmur heard. Pulmonary/Chest: Effort normal and breath sounds normal. No respiratory distress. She has no wheezes. She has no rales. She exhibits no tenderness.  Abdominal: Soft. Bowel sounds are normal. She exhibits no distension and no mass. There is no tenderness. There is no rebound and no guarding.       obese  Musculoskeletal: Normal range of motion. She exhibits no edema and no tenderness.       Antalgic gait uses walker  Lymphadenopathy:    She has no cervical adenopathy.  Neurological: She is alert and oriented to person, place, and time. She has normal reflexes. No cranial nerve deficit. She exhibits normal muscle tone. Coordination normal.  Skin: Skin is warm and dry. No rash noted. She is not diaphoretic. No erythema. No pallor.  Psychiatric: Her behavior is normal. Judgment and thought content normal.       Flat affect but is looking more cheerful            Assessment & Plan:

## 2012-08-11 NOTE — Assessment & Plan Note (Signed)
She is looking for a second opinion in [place] with an Investment banker, operational. I have recommended 3 names. She will research them and might seek an opinion before her hip surgery.

## 2012-08-11 NOTE — Assessment & Plan Note (Signed)
Multifactorial dyspnea persist despite weight loss and water aerobics and continue Dulera 1 puff twice daily. I noticed that she is on nonspecific beta blocker carvedilol. I will send this note to primary care physician to see she can be switched over to beta 1 specific beta blocker such as Lopressor or bisoprolol

## 2012-08-20 NOTE — Telephone Encounter (Signed)
I spoke with Inetta Fermo, Dr. Sherril Croon nurse and advised. [name], CMA

## 2012-12-10 ENCOUNTER — Ambulatory Visit: Payer: Medicare Other | Admitting: Orthopedic Surgery

## 2012-12-11 ENCOUNTER — Ambulatory Visit: Payer: Medicare Other | Admitting: Orthopedic Surgery

## 2013-01-22 ENCOUNTER — Other Ambulatory Visit: Payer: Self-pay | Admitting: *Deleted

## 2013-01-22 DIAGNOSIS — M169 Osteoarthritis of hip, unspecified: Secondary | ICD-10-CM

## 2013-01-22 MED ORDER — TRAMADOL HCL 50 MG PO TABS: 50.0000 mg | ORAL_TABLET | Freq: Four times a day (QID) | ORAL | Status: DC | PRN

## 2013-02-09 ENCOUNTER — Other Ambulatory Visit: Payer: Medicare Other

## 2013-07-21 ENCOUNTER — Other Ambulatory Visit: Payer: Self-pay | Admitting: *Deleted

## 2013-07-21 DIAGNOSIS — M169 Osteoarthritis of hip, unspecified: Secondary | ICD-10-CM

## 2013-07-21 MED ORDER — TRAMADOL HCL 50 MG PO TABS: 50.0000 mg | ORAL_TABLET | Freq: Four times a day (QID) | ORAL | Status: DC | PRN

## 2014-04-21 ENCOUNTER — Other Ambulatory Visit: Payer: Self-pay | Admitting: Orthopedic Surgery

## 2014-11-25 IMAGING — CT CT CHEST W/O CM
2 of 3 series · 15 of 36 positions shown, 18 images · IV contrast (Omnipaque 300)
Comparison: Chest radiograph 05/08/2012 and CT chest 09/09/2009,
12/08/2008.

CLINICAL DATA: Follow-up lung nodule.  Shortness of breath with
exertion.

CT CHEST WITHOUT CONTRAST
TECHNIQUE: Multidetector CT imaging of the chest was performed
following the standard protocol without IV contrast.

[Series 2: chest routine with · axial · 0.75mm/px · z∈[-332,-62]mm · 12 of 64 slices shown, 15 images]
[im 5/64  mediastinal]
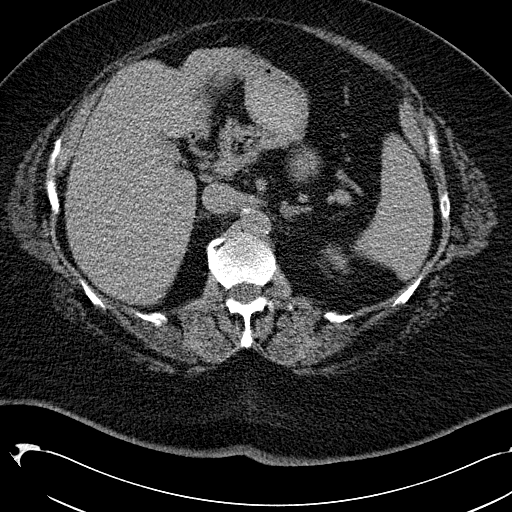
[im 5/64  lung]
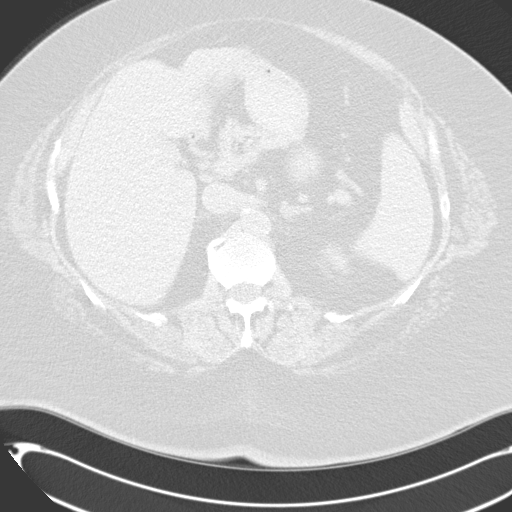
[im 10/64  lung]
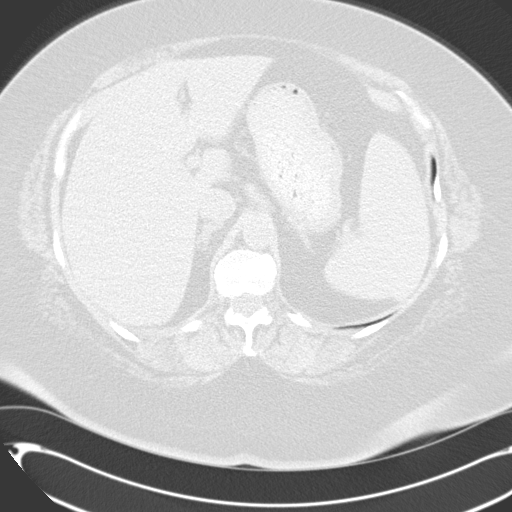
[im 15/64  lung]
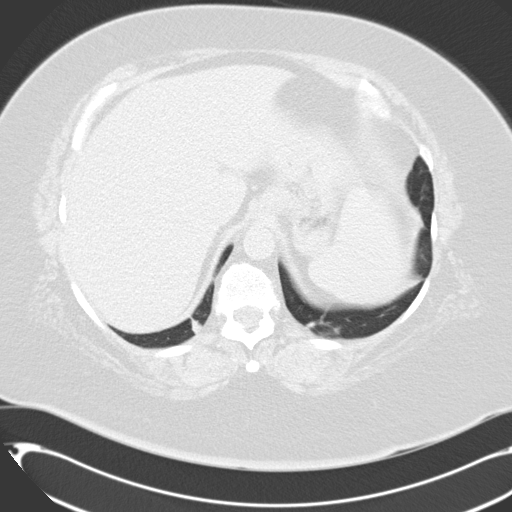
[im 19/64  lung]
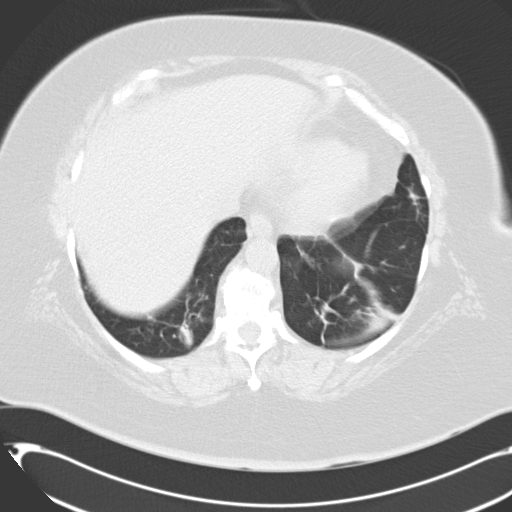
[im 24/64  mediastinal]
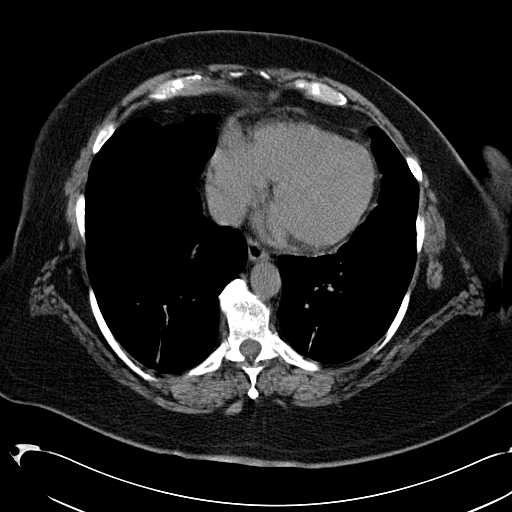
[im 24/64  lung]
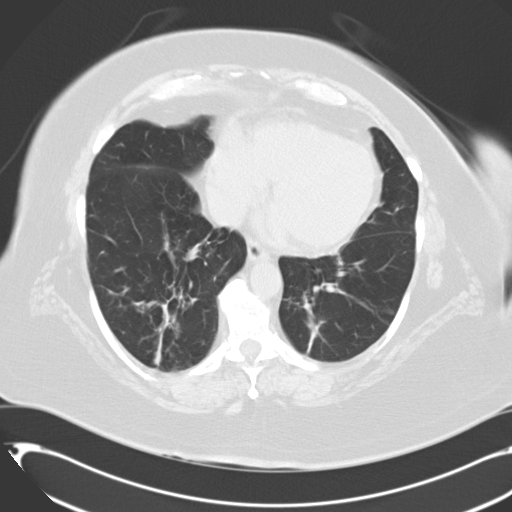
[im 29/64  lung]
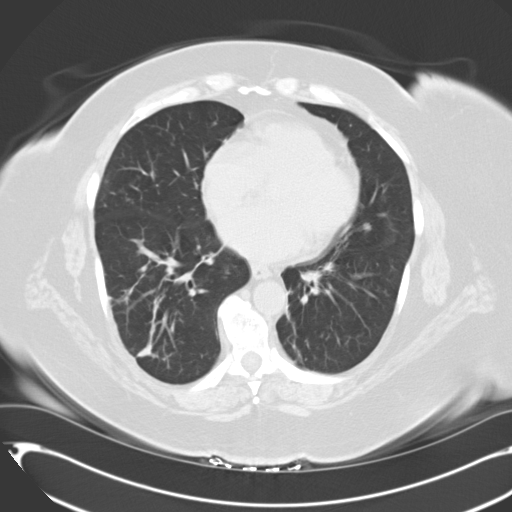
[im 36/64  lung]
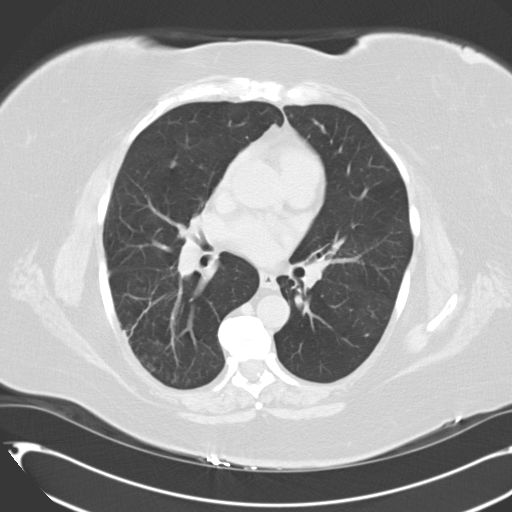
[im 40/64  lung]
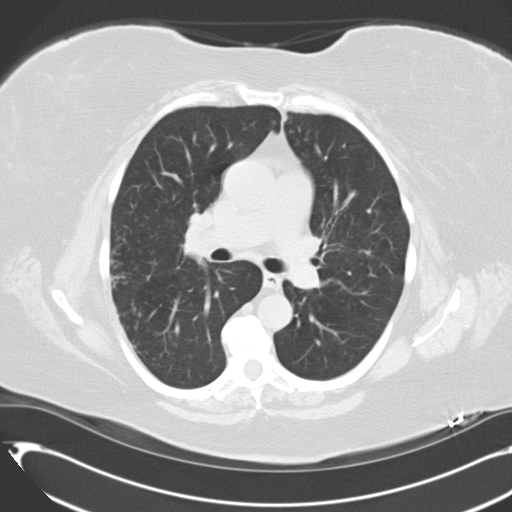
[im 45/64  mediastinal]
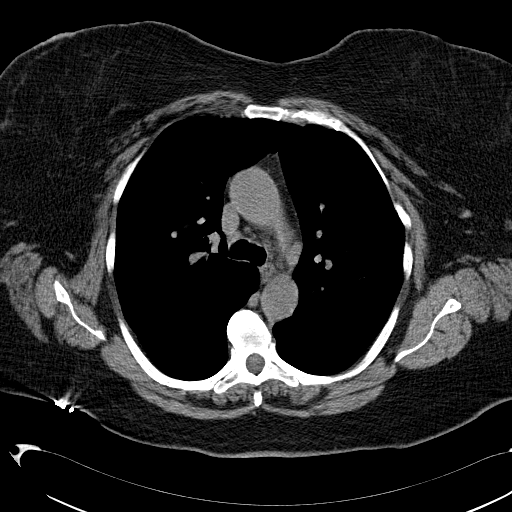
[im 45/64  lung]
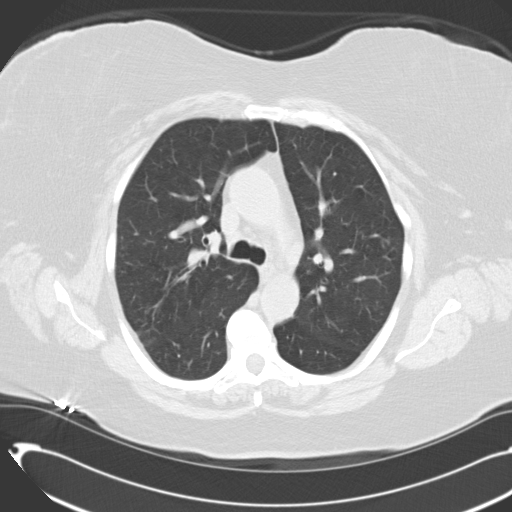
[im 50/64  lung]
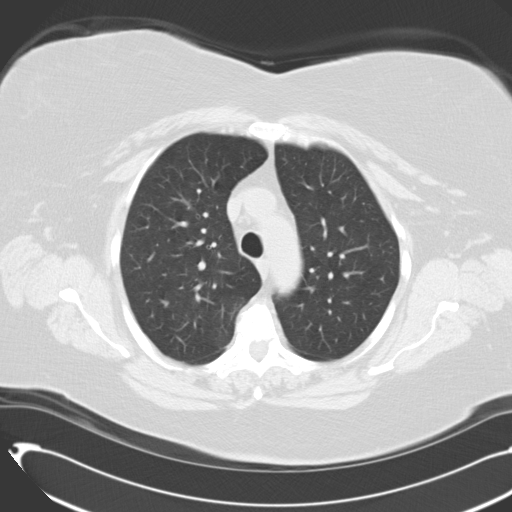
[im 54/64  lung]
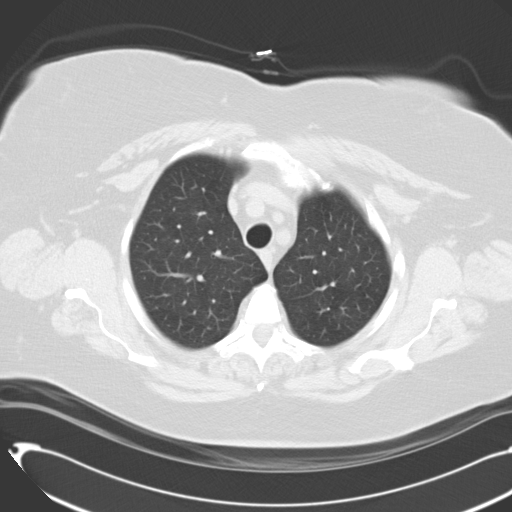
[im 59/64  lung]
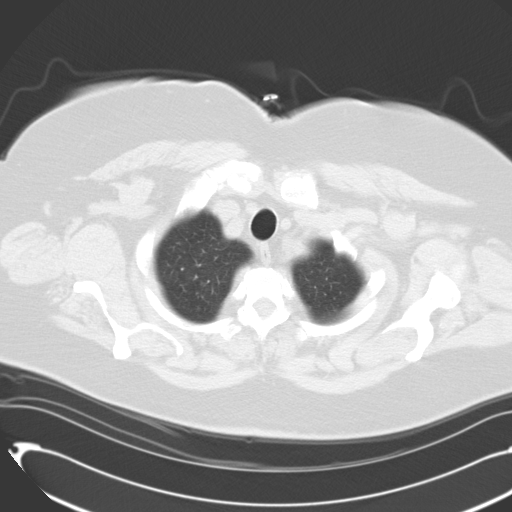

[Series 602: cor · coronal · 0.75mm/px · 3 of 128 slices shown]
[im 26/128  lung]
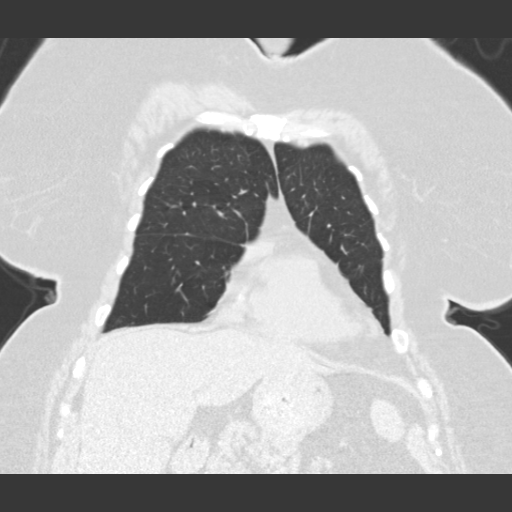
[im 51/128  lung]
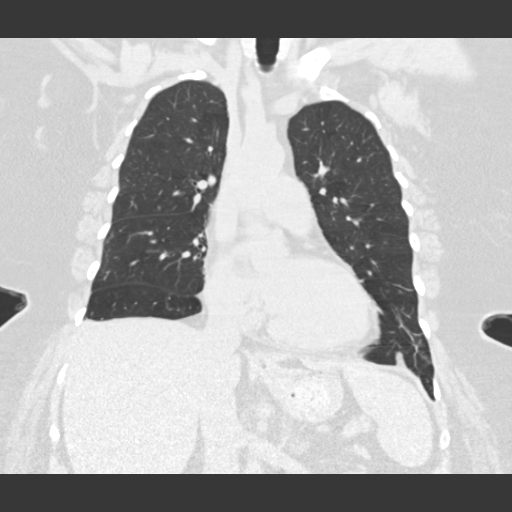
[im 77/128  lung]
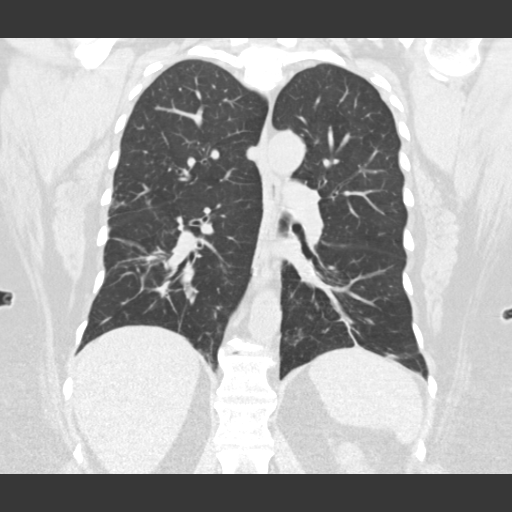

[15 of 36 positions shown; findings below may reference images not displayed]

FINDINGS: No pathologically enlarged mediastinal, hilar or axillary
lymph nodes.  Heart size normal.  No pericardial effusion.

Scattered peribronchovascular nodularity and linear scarring
bilaterally, with lower lobe predominance.  No dominant pulmonary
nodule. Previously seen 6 mm nodule along the right major fissure
(image 28) is less prominent on the current exam. No pleural fluid.
Airway is unremarkable.

Incidental imaging of the upper abdomen shows intermediate
attenuation material layering in the gallbladder.  No worrisome
lytic or sclerotic lesions.  Degenerative changes are seen in the
spine.
IMPRESSION: 1.  No worrisome pulmonary nodules.
2.  Bibasilar predominant peribronchovascular nodularity and
scarring in the lungs bilaterally, mildly progressive from
09/09/2009 and likely post infectious in etiology.

## 2015-02-17 ENCOUNTER — Ambulatory Visit (INDEPENDENT_AMBULATORY_CARE_PROVIDER_SITE_OTHER): Payer: Medicare Other | Admitting: *Deleted

## 2015-02-17 DIAGNOSIS — Z23 Encounter for immunization: Secondary | ICD-10-CM

## 2015-10-12 DIAGNOSIS — Z0000 Encounter for general adult medical examination without abnormal findings: Secondary | ICD-10-CM | POA: Diagnosis not present

## 2015-10-12 DIAGNOSIS — Z299 Encounter for prophylactic measures, unspecified: Secondary | ICD-10-CM | POA: Diagnosis not present

## 2015-10-12 DIAGNOSIS — R5383 Other fatigue: Secondary | ICD-10-CM | POA: Diagnosis not present

## 2015-10-12 DIAGNOSIS — Z01419 Encounter for gynecological examination (general) (routine) without abnormal findings: Secondary | ICD-10-CM | POA: Diagnosis not present

## 2015-10-12 DIAGNOSIS — Z1211 Encounter for screening for malignant neoplasm of colon: Secondary | ICD-10-CM | POA: Diagnosis not present

## 2015-10-12 DIAGNOSIS — Z7189 Other specified counseling: Secondary | ICD-10-CM | POA: Diagnosis not present

## 2015-10-12 DIAGNOSIS — Z1389 Encounter for screening for other disorder: Secondary | ICD-10-CM | POA: Diagnosis not present

## 2015-10-12 DIAGNOSIS — I10 Essential (primary) hypertension: Secondary | ICD-10-CM | POA: Diagnosis not present

## 2015-10-12 DIAGNOSIS — Z79899 Other long term (current) drug therapy: Secondary | ICD-10-CM | POA: Diagnosis not present

## 2015-11-29 DIAGNOSIS — M1990 Unspecified osteoarthritis, unspecified site: Secondary | ICD-10-CM | POA: Diagnosis not present

## 2015-11-29 DIAGNOSIS — Z299 Encounter for prophylactic measures, unspecified: Secondary | ICD-10-CM | POA: Diagnosis not present

## 2015-11-29 DIAGNOSIS — I10 Essential (primary) hypertension: Secondary | ICD-10-CM | POA: Diagnosis not present

## 2015-11-29 DIAGNOSIS — J449 Chronic obstructive pulmonary disease, unspecified: Secondary | ICD-10-CM | POA: Diagnosis not present

## 2015-11-29 DIAGNOSIS — Z789 Other specified health status: Secondary | ICD-10-CM | POA: Diagnosis not present

## 2016-02-14 ENCOUNTER — Ambulatory Visit (INDEPENDENT_AMBULATORY_CARE_PROVIDER_SITE_OTHER): Payer: Medicare Other | Admitting: *Deleted

## 2016-02-14 DIAGNOSIS — Z23 Encounter for immunization: Secondary | ICD-10-CM

## 2016-02-29 DIAGNOSIS — M25569 Pain in unspecified knee: Secondary | ICD-10-CM | POA: Diagnosis not present

## 2016-02-29 DIAGNOSIS — Z6841 Body Mass Index (BMI) 40.0 and over, adult: Secondary | ICD-10-CM | POA: Diagnosis not present

## 2016-02-29 DIAGNOSIS — Z299 Encounter for prophylactic measures, unspecified: Secondary | ICD-10-CM | POA: Diagnosis not present

## 2016-02-29 DIAGNOSIS — Z713 Dietary counseling and surveillance: Secondary | ICD-10-CM | POA: Diagnosis not present

## 2016-07-11 DIAGNOSIS — M1990 Unspecified osteoarthritis, unspecified site: Secondary | ICD-10-CM | POA: Diagnosis not present

## 2016-07-11 DIAGNOSIS — J449 Chronic obstructive pulmonary disease, unspecified: Secondary | ICD-10-CM | POA: Diagnosis not present

## 2016-07-11 DIAGNOSIS — Z299 Encounter for prophylactic measures, unspecified: Secondary | ICD-10-CM | POA: Diagnosis not present

## 2016-07-11 DIAGNOSIS — Z79899 Other long term (current) drug therapy: Secondary | ICD-10-CM | POA: Diagnosis not present

## 2016-07-11 DIAGNOSIS — Z713 Dietary counseling and surveillance: Secondary | ICD-10-CM | POA: Diagnosis not present

## 2016-07-11 DIAGNOSIS — I10 Essential (primary) hypertension: Secondary | ICD-10-CM | POA: Diagnosis not present

## 2016-07-11 DIAGNOSIS — Z6841 Body Mass Index (BMI) 40.0 and over, adult: Secondary | ICD-10-CM | POA: Diagnosis not present

## 2016-10-16 DIAGNOSIS — Z299 Encounter for prophylactic measures, unspecified: Secondary | ICD-10-CM | POA: Diagnosis not present

## 2016-10-16 DIAGNOSIS — Z1389 Encounter for screening for other disorder: Secondary | ICD-10-CM | POA: Diagnosis not present

## 2016-10-16 DIAGNOSIS — I10 Essential (primary) hypertension: Secondary | ICD-10-CM | POA: Diagnosis not present

## 2016-10-16 DIAGNOSIS — Z0000 Encounter for general adult medical examination without abnormal findings: Secondary | ICD-10-CM | POA: Diagnosis not present

## 2016-10-16 DIAGNOSIS — Z6841 Body Mass Index (BMI) 40.0 and over, adult: Secondary | ICD-10-CM | POA: Diagnosis not present

## 2016-10-16 DIAGNOSIS — Z79899 Other long term (current) drug therapy: Secondary | ICD-10-CM | POA: Diagnosis not present

## 2016-10-16 DIAGNOSIS — Z1211 Encounter for screening for malignant neoplasm of colon: Secondary | ICD-10-CM | POA: Diagnosis not present

## 2016-10-16 DIAGNOSIS — Z7189 Other specified counseling: Secondary | ICD-10-CM | POA: Diagnosis not present

## 2016-11-14 DIAGNOSIS — N6321 Unspecified lump in the left breast, upper outer quadrant: Secondary | ICD-10-CM | POA: Diagnosis not present

## 2016-11-14 DIAGNOSIS — R918 Other nonspecific abnormal finding of lung field: Secondary | ICD-10-CM | POA: Diagnosis not present

## 2016-11-14 DIAGNOSIS — R921 Mammographic calcification found on diagnostic imaging of breast: Secondary | ICD-10-CM | POA: Diagnosis not present

## 2016-11-14 DIAGNOSIS — R928 Other abnormal and inconclusive findings on diagnostic imaging of breast: Secondary | ICD-10-CM | POA: Diagnosis not present

## 2016-12-20 DIAGNOSIS — Z6841 Body Mass Index (BMI) 40.0 and over, adult: Secondary | ICD-10-CM | POA: Diagnosis not present

## 2016-12-20 DIAGNOSIS — Z299 Encounter for prophylactic measures, unspecified: Secondary | ICD-10-CM | POA: Diagnosis not present

## 2016-12-20 DIAGNOSIS — M545 Low back pain: Secondary | ICD-10-CM | POA: Diagnosis not present

## 2016-12-20 DIAGNOSIS — I10 Essential (primary) hypertension: Secondary | ICD-10-CM | POA: Diagnosis not present

## 2016-12-20 DIAGNOSIS — M1990 Unspecified osteoarthritis, unspecified site: Secondary | ICD-10-CM | POA: Diagnosis not present

## 2017-03-05 ENCOUNTER — Ambulatory Visit (INDEPENDENT_AMBULATORY_CARE_PROVIDER_SITE_OTHER): Payer: Medicare Other | Admitting: *Deleted

## 2017-03-05 DIAGNOSIS — Z23 Encounter for immunization: Secondary | ICD-10-CM | POA: Diagnosis not present

## 2017-03-22 DIAGNOSIS — I10 Essential (primary) hypertension: Secondary | ICD-10-CM | POA: Diagnosis not present

## 2017-03-22 DIAGNOSIS — M1990 Unspecified osteoarthritis, unspecified site: Secondary | ICD-10-CM | POA: Diagnosis not present

## 2017-03-22 DIAGNOSIS — J449 Chronic obstructive pulmonary disease, unspecified: Secondary | ICD-10-CM | POA: Diagnosis not present

## 2017-03-22 DIAGNOSIS — Z299 Encounter for prophylactic measures, unspecified: Secondary | ICD-10-CM | POA: Diagnosis not present

## 2017-03-22 DIAGNOSIS — Z6841 Body Mass Index (BMI) 40.0 and over, adult: Secondary | ICD-10-CM | POA: Diagnosis not present

## 2017-03-25 DIAGNOSIS — R05 Cough: Secondary | ICD-10-CM | POA: Diagnosis not present

## 2017-03-25 DIAGNOSIS — I10 Essential (primary) hypertension: Secondary | ICD-10-CM | POA: Diagnosis not present

## 2017-03-25 DIAGNOSIS — Z79899 Other long term (current) drug therapy: Secondary | ICD-10-CM | POA: Diagnosis not present

## 2017-03-25 DIAGNOSIS — J209 Acute bronchitis, unspecified: Secondary | ICD-10-CM | POA: Diagnosis not present

## 2017-06-14 DIAGNOSIS — J45901 Unspecified asthma with (acute) exacerbation: Secondary | ICD-10-CM | POA: Diagnosis not present

## 2017-06-14 DIAGNOSIS — R0602 Shortness of breath: Secondary | ICD-10-CM | POA: Diagnosis not present

## 2017-06-14 DIAGNOSIS — J441 Chronic obstructive pulmonary disease with (acute) exacerbation: Secondary | ICD-10-CM | POA: Diagnosis not present

## 2017-06-14 DIAGNOSIS — J849 Interstitial pulmonary disease, unspecified: Secondary | ICD-10-CM | POA: Diagnosis not present

## 2017-06-14 DIAGNOSIS — R05 Cough: Secondary | ICD-10-CM | POA: Diagnosis not present

## 2017-06-14 DIAGNOSIS — J209 Acute bronchitis, unspecified: Secondary | ICD-10-CM | POA: Diagnosis not present

## 2017-06-14 DIAGNOSIS — J440 Chronic obstructive pulmonary disease with acute lower respiratory infection: Secondary | ICD-10-CM | POA: Diagnosis not present

## 2017-06-14 DIAGNOSIS — I10 Essential (primary) hypertension: Secondary | ICD-10-CM | POA: Diagnosis not present

## 2017-06-15 DIAGNOSIS — J45901 Unspecified asthma with (acute) exacerbation: Secondary | ICD-10-CM | POA: Diagnosis not present

## 2017-06-15 DIAGNOSIS — R609 Edema, unspecified: Secondary | ICD-10-CM | POA: Diagnosis present

## 2017-06-15 DIAGNOSIS — I10 Essential (primary) hypertension: Secondary | ICD-10-CM | POA: Diagnosis present

## 2017-06-15 DIAGNOSIS — Z8249 Family history of ischemic heart disease and other diseases of the circulatory system: Secondary | ICD-10-CM | POA: Diagnosis not present

## 2017-06-15 DIAGNOSIS — J440 Chronic obstructive pulmonary disease with acute lower respiratory infection: Secondary | ICD-10-CM | POA: Diagnosis present

## 2017-06-15 DIAGNOSIS — J209 Acute bronchitis, unspecified: Secondary | ICD-10-CM | POA: Diagnosis present

## 2017-06-15 DIAGNOSIS — J849 Interstitial pulmonary disease, unspecified: Secondary | ICD-10-CM | POA: Diagnosis not present

## 2017-06-15 DIAGNOSIS — M1990 Unspecified osteoarthritis, unspecified site: Secondary | ICD-10-CM | POA: Diagnosis present

## 2017-06-15 DIAGNOSIS — Z887 Allergy status to serum and vaccine status: Secondary | ICD-10-CM | POA: Diagnosis not present

## 2017-06-15 DIAGNOSIS — J441 Chronic obstructive pulmonary disease with (acute) exacerbation: Secondary | ICD-10-CM | POA: Diagnosis present

## 2017-06-15 DIAGNOSIS — R0602 Shortness of breath: Secondary | ICD-10-CM | POA: Diagnosis not present

## 2017-06-15 DIAGNOSIS — Z79899 Other long term (current) drug therapy: Secondary | ICD-10-CM | POA: Diagnosis not present

## 2017-06-18 DIAGNOSIS — Z9981 Dependence on supplemental oxygen: Secondary | ICD-10-CM | POA: Diagnosis not present

## 2017-06-18 DIAGNOSIS — Z8701 Personal history of pneumonia (recurrent): Secondary | ICD-10-CM | POA: Diagnosis not present

## 2017-06-18 DIAGNOSIS — I10 Essential (primary) hypertension: Secondary | ICD-10-CM | POA: Diagnosis not present

## 2017-06-18 DIAGNOSIS — J45901 Unspecified asthma with (acute) exacerbation: Secondary | ICD-10-CM | POA: Diagnosis not present

## 2017-06-18 DIAGNOSIS — J440 Chronic obstructive pulmonary disease with acute lower respiratory infection: Secondary | ICD-10-CM | POA: Diagnosis not present

## 2017-06-18 DIAGNOSIS — M1990 Unspecified osteoarthritis, unspecified site: Secondary | ICD-10-CM | POA: Diagnosis not present

## 2017-06-18 DIAGNOSIS — J209 Acute bronchitis, unspecified: Secondary | ICD-10-CM | POA: Diagnosis not present

## 2017-06-18 DIAGNOSIS — R600 Localized edema: Secondary | ICD-10-CM | POA: Diagnosis not present

## 2017-06-20 DIAGNOSIS — R600 Localized edema: Secondary | ICD-10-CM | POA: Diagnosis not present

## 2017-06-20 DIAGNOSIS — J45901 Unspecified asthma with (acute) exacerbation: Secondary | ICD-10-CM | POA: Diagnosis not present

## 2017-06-20 DIAGNOSIS — J440 Chronic obstructive pulmonary disease with acute lower respiratory infection: Secondary | ICD-10-CM | POA: Diagnosis not present

## 2017-06-20 DIAGNOSIS — I10 Essential (primary) hypertension: Secondary | ICD-10-CM | POA: Diagnosis not present

## 2017-06-20 DIAGNOSIS — M1990 Unspecified osteoarthritis, unspecified site: Secondary | ICD-10-CM | POA: Diagnosis not present

## 2017-06-20 DIAGNOSIS — J209 Acute bronchitis, unspecified: Secondary | ICD-10-CM | POA: Diagnosis not present

## 2017-06-24 DIAGNOSIS — I10 Essential (primary) hypertension: Secondary | ICD-10-CM | POA: Diagnosis not present

## 2017-06-24 DIAGNOSIS — M1990 Unspecified osteoarthritis, unspecified site: Secondary | ICD-10-CM | POA: Diagnosis not present

## 2017-06-24 DIAGNOSIS — Z09 Encounter for follow-up examination after completed treatment for conditions other than malignant neoplasm: Secondary | ICD-10-CM | POA: Diagnosis not present

## 2017-06-24 DIAGNOSIS — J449 Chronic obstructive pulmonary disease, unspecified: Secondary | ICD-10-CM | POA: Diagnosis not present

## 2017-06-24 DIAGNOSIS — Z6841 Body Mass Index (BMI) 40.0 and over, adult: Secondary | ICD-10-CM | POA: Diagnosis not present

## 2017-06-24 DIAGNOSIS — F329 Major depressive disorder, single episode, unspecified: Secondary | ICD-10-CM | POA: Diagnosis not present

## 2017-06-24 DIAGNOSIS — Z299 Encounter for prophylactic measures, unspecified: Secondary | ICD-10-CM | POA: Diagnosis not present

## 2017-06-25 DIAGNOSIS — M1990 Unspecified osteoarthritis, unspecified site: Secondary | ICD-10-CM | POA: Diagnosis not present

## 2017-06-25 DIAGNOSIS — R600 Localized edema: Secondary | ICD-10-CM | POA: Diagnosis not present

## 2017-06-25 DIAGNOSIS — I10 Essential (primary) hypertension: Secondary | ICD-10-CM | POA: Diagnosis not present

## 2017-06-25 DIAGNOSIS — J45901 Unspecified asthma with (acute) exacerbation: Secondary | ICD-10-CM | POA: Diagnosis not present

## 2017-06-25 DIAGNOSIS — J209 Acute bronchitis, unspecified: Secondary | ICD-10-CM | POA: Diagnosis not present

## 2017-06-25 DIAGNOSIS — J440 Chronic obstructive pulmonary disease with acute lower respiratory infection: Secondary | ICD-10-CM | POA: Diagnosis not present

## 2017-06-26 DIAGNOSIS — I10 Essential (primary) hypertension: Secondary | ICD-10-CM | POA: Diagnosis not present

## 2017-06-26 DIAGNOSIS — J45901 Unspecified asthma with (acute) exacerbation: Secondary | ICD-10-CM | POA: Diagnosis not present

## 2017-06-26 DIAGNOSIS — M1990 Unspecified osteoarthritis, unspecified site: Secondary | ICD-10-CM | POA: Diagnosis not present

## 2017-06-26 DIAGNOSIS — J209 Acute bronchitis, unspecified: Secondary | ICD-10-CM | POA: Diagnosis not present

## 2017-06-26 DIAGNOSIS — R600 Localized edema: Secondary | ICD-10-CM | POA: Diagnosis not present

## 2017-06-26 DIAGNOSIS — J440 Chronic obstructive pulmonary disease with acute lower respiratory infection: Secondary | ICD-10-CM | POA: Diagnosis not present

## 2017-06-28 DIAGNOSIS — I10 Essential (primary) hypertension: Secondary | ICD-10-CM | POA: Diagnosis not present

## 2017-06-28 DIAGNOSIS — J209 Acute bronchitis, unspecified: Secondary | ICD-10-CM | POA: Diagnosis not present

## 2017-06-28 DIAGNOSIS — R600 Localized edema: Secondary | ICD-10-CM | POA: Diagnosis not present

## 2017-06-28 DIAGNOSIS — J45901 Unspecified asthma with (acute) exacerbation: Secondary | ICD-10-CM | POA: Diagnosis not present

## 2017-06-28 DIAGNOSIS — M1990 Unspecified osteoarthritis, unspecified site: Secondary | ICD-10-CM | POA: Diagnosis not present

## 2017-06-28 DIAGNOSIS — J440 Chronic obstructive pulmonary disease with acute lower respiratory infection: Secondary | ICD-10-CM | POA: Diagnosis not present

## 2017-07-01 DIAGNOSIS — J209 Acute bronchitis, unspecified: Secondary | ICD-10-CM | POA: Diagnosis not present

## 2017-07-01 DIAGNOSIS — M1990 Unspecified osteoarthritis, unspecified site: Secondary | ICD-10-CM | POA: Diagnosis not present

## 2017-07-01 DIAGNOSIS — R600 Localized edema: Secondary | ICD-10-CM | POA: Diagnosis not present

## 2017-07-01 DIAGNOSIS — J45901 Unspecified asthma with (acute) exacerbation: Secondary | ICD-10-CM | POA: Diagnosis not present

## 2017-07-01 DIAGNOSIS — I10 Essential (primary) hypertension: Secondary | ICD-10-CM | POA: Diagnosis not present

## 2017-07-01 DIAGNOSIS — J440 Chronic obstructive pulmonary disease with acute lower respiratory infection: Secondary | ICD-10-CM | POA: Diagnosis not present

## 2017-07-02 DIAGNOSIS — J440 Chronic obstructive pulmonary disease with acute lower respiratory infection: Secondary | ICD-10-CM | POA: Diagnosis not present

## 2017-07-02 DIAGNOSIS — J45901 Unspecified asthma with (acute) exacerbation: Secondary | ICD-10-CM | POA: Diagnosis not present

## 2017-07-02 DIAGNOSIS — J209 Acute bronchitis, unspecified: Secondary | ICD-10-CM | POA: Diagnosis not present

## 2017-07-02 DIAGNOSIS — I10 Essential (primary) hypertension: Secondary | ICD-10-CM | POA: Diagnosis not present

## 2017-07-02 DIAGNOSIS — M1990 Unspecified osteoarthritis, unspecified site: Secondary | ICD-10-CM | POA: Diagnosis not present

## 2017-07-02 DIAGNOSIS — R600 Localized edema: Secondary | ICD-10-CM | POA: Diagnosis not present

## 2017-07-08 DIAGNOSIS — R600 Localized edema: Secondary | ICD-10-CM | POA: Diagnosis not present

## 2017-07-08 DIAGNOSIS — J440 Chronic obstructive pulmonary disease with acute lower respiratory infection: Secondary | ICD-10-CM | POA: Diagnosis not present

## 2017-07-08 DIAGNOSIS — M1990 Unspecified osteoarthritis, unspecified site: Secondary | ICD-10-CM | POA: Diagnosis not present

## 2017-07-08 DIAGNOSIS — I10 Essential (primary) hypertension: Secondary | ICD-10-CM | POA: Diagnosis not present

## 2017-07-08 DIAGNOSIS — J209 Acute bronchitis, unspecified: Secondary | ICD-10-CM | POA: Diagnosis not present

## 2017-07-08 DIAGNOSIS — J45901 Unspecified asthma with (acute) exacerbation: Secondary | ICD-10-CM | POA: Diagnosis not present

## 2017-07-16 DIAGNOSIS — R600 Localized edema: Secondary | ICD-10-CM | POA: Diagnosis not present

## 2017-07-16 DIAGNOSIS — M1990 Unspecified osteoarthritis, unspecified site: Secondary | ICD-10-CM | POA: Diagnosis not present

## 2017-07-16 DIAGNOSIS — J209 Acute bronchitis, unspecified: Secondary | ICD-10-CM | POA: Diagnosis not present

## 2017-07-16 DIAGNOSIS — I10 Essential (primary) hypertension: Secondary | ICD-10-CM | POA: Diagnosis not present

## 2017-07-16 DIAGNOSIS — J440 Chronic obstructive pulmonary disease with acute lower respiratory infection: Secondary | ICD-10-CM | POA: Diagnosis not present

## 2017-07-16 DIAGNOSIS — J45901 Unspecified asthma with (acute) exacerbation: Secondary | ICD-10-CM | POA: Diagnosis not present

## 2017-07-26 DIAGNOSIS — J209 Acute bronchitis, unspecified: Secondary | ICD-10-CM | POA: Diagnosis not present

## 2017-07-26 DIAGNOSIS — R600 Localized edema: Secondary | ICD-10-CM | POA: Diagnosis not present

## 2017-07-26 DIAGNOSIS — J45901 Unspecified asthma with (acute) exacerbation: Secondary | ICD-10-CM | POA: Diagnosis not present

## 2017-07-26 DIAGNOSIS — M1990 Unspecified osteoarthritis, unspecified site: Secondary | ICD-10-CM | POA: Diagnosis not present

## 2017-07-26 DIAGNOSIS — J440 Chronic obstructive pulmonary disease with acute lower respiratory infection: Secondary | ICD-10-CM | POA: Diagnosis not present

## 2017-07-26 DIAGNOSIS — I10 Essential (primary) hypertension: Secondary | ICD-10-CM | POA: Diagnosis not present

## 2017-07-30 DIAGNOSIS — J45901 Unspecified asthma with (acute) exacerbation: Secondary | ICD-10-CM | POA: Diagnosis not present

## 2017-07-30 DIAGNOSIS — J440 Chronic obstructive pulmonary disease with acute lower respiratory infection: Secondary | ICD-10-CM | POA: Diagnosis not present

## 2017-07-30 DIAGNOSIS — I10 Essential (primary) hypertension: Secondary | ICD-10-CM | POA: Diagnosis not present

## 2017-07-30 DIAGNOSIS — R600 Localized edema: Secondary | ICD-10-CM | POA: Diagnosis not present

## 2017-07-30 DIAGNOSIS — J209 Acute bronchitis, unspecified: Secondary | ICD-10-CM | POA: Diagnosis not present

## 2017-07-30 DIAGNOSIS — M1990 Unspecified osteoarthritis, unspecified site: Secondary | ICD-10-CM | POA: Diagnosis not present

## 2017-08-07 DIAGNOSIS — M1990 Unspecified osteoarthritis, unspecified site: Secondary | ICD-10-CM | POA: Diagnosis not present

## 2017-08-07 DIAGNOSIS — J45901 Unspecified asthma with (acute) exacerbation: Secondary | ICD-10-CM | POA: Diagnosis not present

## 2017-08-07 DIAGNOSIS — J440 Chronic obstructive pulmonary disease with acute lower respiratory infection: Secondary | ICD-10-CM | POA: Diagnosis not present

## 2017-08-07 DIAGNOSIS — J209 Acute bronchitis, unspecified: Secondary | ICD-10-CM | POA: Diagnosis not present

## 2017-08-07 DIAGNOSIS — I10 Essential (primary) hypertension: Secondary | ICD-10-CM | POA: Diagnosis not present

## 2017-08-07 DIAGNOSIS — R600 Localized edema: Secondary | ICD-10-CM | POA: Diagnosis not present

## 2017-09-24 DIAGNOSIS — I10 Essential (primary) hypertension: Secondary | ICD-10-CM | POA: Diagnosis not present

## 2017-09-24 DIAGNOSIS — Z6841 Body Mass Index (BMI) 40.0 and over, adult: Secondary | ICD-10-CM | POA: Diagnosis not present

## 2017-09-24 DIAGNOSIS — J449 Chronic obstructive pulmonary disease, unspecified: Secondary | ICD-10-CM | POA: Diagnosis not present

## 2017-09-24 DIAGNOSIS — Z299 Encounter for prophylactic measures, unspecified: Secondary | ICD-10-CM | POA: Diagnosis not present

## 2017-09-24 DIAGNOSIS — Z789 Other specified health status: Secondary | ICD-10-CM | POA: Diagnosis not present

## 2017-09-24 DIAGNOSIS — F321 Major depressive disorder, single episode, moderate: Secondary | ICD-10-CM | POA: Diagnosis not present

## 2017-10-21 DIAGNOSIS — Z6841 Body Mass Index (BMI) 40.0 and over, adult: Secondary | ICD-10-CM | POA: Diagnosis not present

## 2017-10-21 DIAGNOSIS — I10 Essential (primary) hypertension: Secondary | ICD-10-CM | POA: Diagnosis not present

## 2017-10-21 DIAGNOSIS — J449 Chronic obstructive pulmonary disease, unspecified: Secondary | ICD-10-CM | POA: Diagnosis not present

## 2017-10-21 DIAGNOSIS — Z299 Encounter for prophylactic measures, unspecified: Secondary | ICD-10-CM | POA: Diagnosis not present

## 2017-10-21 DIAGNOSIS — J209 Acute bronchitis, unspecified: Secondary | ICD-10-CM | POA: Diagnosis not present

## 2017-10-21 DIAGNOSIS — J440 Chronic obstructive pulmonary disease with acute lower respiratory infection: Secondary | ICD-10-CM | POA: Diagnosis not present

## 2017-10-21 DIAGNOSIS — M1990 Unspecified osteoarthritis, unspecified site: Secondary | ICD-10-CM | POA: Diagnosis not present

## 2017-11-15 DIAGNOSIS — Z6841 Body Mass Index (BMI) 40.0 and over, adult: Secondary | ICD-10-CM | POA: Diagnosis not present

## 2017-11-15 DIAGNOSIS — R5383 Other fatigue: Secondary | ICD-10-CM | POA: Diagnosis not present

## 2017-11-15 DIAGNOSIS — E7800 Pure hypercholesterolemia, unspecified: Secondary | ICD-10-CM | POA: Diagnosis not present

## 2017-11-15 DIAGNOSIS — Z7189 Other specified counseling: Secondary | ICD-10-CM | POA: Diagnosis not present

## 2017-11-15 DIAGNOSIS — Z1339 Encounter for screening examination for other mental health and behavioral disorders: Secondary | ICD-10-CM | POA: Diagnosis not present

## 2017-11-15 DIAGNOSIS — I10 Essential (primary) hypertension: Secondary | ICD-10-CM | POA: Diagnosis not present

## 2017-11-15 DIAGNOSIS — Z79899 Other long term (current) drug therapy: Secondary | ICD-10-CM | POA: Diagnosis not present

## 2017-11-15 DIAGNOSIS — Z1331 Encounter for screening for depression: Secondary | ICD-10-CM | POA: Diagnosis not present

## 2017-11-15 DIAGNOSIS — Z299 Encounter for prophylactic measures, unspecified: Secondary | ICD-10-CM | POA: Diagnosis not present

## 2017-11-15 DIAGNOSIS — E559 Vitamin D deficiency, unspecified: Secondary | ICD-10-CM | POA: Diagnosis not present

## 2017-11-15 DIAGNOSIS — Z1211 Encounter for screening for malignant neoplasm of colon: Secondary | ICD-10-CM | POA: Diagnosis not present

## 2017-11-15 DIAGNOSIS — Z0000 Encounter for general adult medical examination without abnormal findings: Secondary | ICD-10-CM | POA: Diagnosis not present

## 2018-02-18 DIAGNOSIS — M1990 Unspecified osteoarthritis, unspecified site: Secondary | ICD-10-CM | POA: Diagnosis not present

## 2018-02-18 DIAGNOSIS — J209 Acute bronchitis, unspecified: Secondary | ICD-10-CM | POA: Diagnosis not present

## 2018-02-18 DIAGNOSIS — Z299 Encounter for prophylactic measures, unspecified: Secondary | ICD-10-CM | POA: Diagnosis not present

## 2018-02-18 DIAGNOSIS — J449 Chronic obstructive pulmonary disease, unspecified: Secondary | ICD-10-CM | POA: Diagnosis not present

## 2018-02-18 DIAGNOSIS — Z6841 Body Mass Index (BMI) 40.0 and over, adult: Secondary | ICD-10-CM | POA: Diagnosis not present

## 2018-02-18 DIAGNOSIS — I10 Essential (primary) hypertension: Secondary | ICD-10-CM | POA: Diagnosis not present

## 2018-06-12 DIAGNOSIS — M1990 Unspecified osteoarthritis, unspecified site: Secondary | ICD-10-CM | POA: Diagnosis not present

## 2018-06-12 DIAGNOSIS — I10 Essential (primary) hypertension: Secondary | ICD-10-CM | POA: Diagnosis not present

## 2018-06-12 DIAGNOSIS — J449 Chronic obstructive pulmonary disease, unspecified: Secondary | ICD-10-CM | POA: Diagnosis not present

## 2018-06-12 DIAGNOSIS — Z299 Encounter for prophylactic measures, unspecified: Secondary | ICD-10-CM | POA: Diagnosis not present

## 2018-06-12 DIAGNOSIS — Z79899 Other long term (current) drug therapy: Secondary | ICD-10-CM | POA: Diagnosis not present

## 2018-06-12 DIAGNOSIS — Z6841 Body Mass Index (BMI) 40.0 and over, adult: Secondary | ICD-10-CM | POA: Diagnosis not present

## 2018-09-16 DIAGNOSIS — Z299 Encounter for prophylactic measures, unspecified: Secondary | ICD-10-CM | POA: Diagnosis not present

## 2018-09-16 DIAGNOSIS — M1990 Unspecified osteoarthritis, unspecified site: Secondary | ICD-10-CM | POA: Diagnosis not present

## 2018-09-16 DIAGNOSIS — Z6841 Body Mass Index (BMI) 40.0 and over, adult: Secondary | ICD-10-CM | POA: Diagnosis not present

## 2018-09-16 DIAGNOSIS — F322 Major depressive disorder, single episode, severe without psychotic features: Secondary | ICD-10-CM | POA: Diagnosis not present

## 2018-09-16 DIAGNOSIS — J449 Chronic obstructive pulmonary disease, unspecified: Secondary | ICD-10-CM | POA: Diagnosis not present

## 2018-09-16 DIAGNOSIS — J9611 Chronic respiratory failure with hypoxia: Secondary | ICD-10-CM | POA: Diagnosis not present

## 2018-12-18 DIAGNOSIS — F321 Major depressive disorder, single episode, moderate: Secondary | ICD-10-CM | POA: Diagnosis not present

## 2018-12-18 DIAGNOSIS — Z1339 Encounter for screening examination for other mental health and behavioral disorders: Secondary | ICD-10-CM | POA: Diagnosis not present

## 2018-12-18 DIAGNOSIS — J449 Chronic obstructive pulmonary disease, unspecified: Secondary | ICD-10-CM | POA: Diagnosis not present

## 2018-12-18 DIAGNOSIS — Z299 Encounter for prophylactic measures, unspecified: Secondary | ICD-10-CM | POA: Diagnosis not present

## 2018-12-18 DIAGNOSIS — I10 Essential (primary) hypertension: Secondary | ICD-10-CM | POA: Diagnosis not present

## 2018-12-18 DIAGNOSIS — Z7189 Other specified counseling: Secondary | ICD-10-CM | POA: Diagnosis not present

## 2018-12-18 DIAGNOSIS — Z6841 Body Mass Index (BMI) 40.0 and over, adult: Secondary | ICD-10-CM | POA: Diagnosis not present

## 2018-12-18 DIAGNOSIS — Z1211 Encounter for screening for malignant neoplasm of colon: Secondary | ICD-10-CM | POA: Diagnosis not present

## 2018-12-18 DIAGNOSIS — M1990 Unspecified osteoarthritis, unspecified site: Secondary | ICD-10-CM | POA: Diagnosis not present

## 2018-12-18 DIAGNOSIS — F322 Major depressive disorder, single episode, severe without psychotic features: Secondary | ICD-10-CM | POA: Diagnosis not present

## 2018-12-18 DIAGNOSIS — Z0000 Encounter for general adult medical examination without abnormal findings: Secondary | ICD-10-CM | POA: Diagnosis not present

## 2018-12-18 DIAGNOSIS — Z1331 Encounter for screening for depression: Secondary | ICD-10-CM | POA: Diagnosis not present

## 2018-12-23 DIAGNOSIS — R5383 Other fatigue: Secondary | ICD-10-CM | POA: Diagnosis not present

## 2018-12-23 DIAGNOSIS — I10 Essential (primary) hypertension: Secondary | ICD-10-CM | POA: Diagnosis not present

## 2018-12-23 DIAGNOSIS — Z6841 Body Mass Index (BMI) 40.0 and over, adult: Secondary | ICD-10-CM | POA: Diagnosis not present

## 2018-12-23 DIAGNOSIS — Z79899 Other long term (current) drug therapy: Secondary | ICD-10-CM | POA: Diagnosis not present

## 2018-12-23 DIAGNOSIS — Z299 Encounter for prophylactic measures, unspecified: Secondary | ICD-10-CM | POA: Diagnosis not present

## 2018-12-23 DIAGNOSIS — J449 Chronic obstructive pulmonary disease, unspecified: Secondary | ICD-10-CM | POA: Diagnosis not present

## 2018-12-23 DIAGNOSIS — M25561 Pain in right knee: Secondary | ICD-10-CM | POA: Diagnosis not present

## 2019-03-23 DIAGNOSIS — M1990 Unspecified osteoarthritis, unspecified site: Secondary | ICD-10-CM | POA: Diagnosis not present

## 2019-03-23 DIAGNOSIS — I10 Essential (primary) hypertension: Secondary | ICD-10-CM | POA: Diagnosis not present

## 2019-03-23 DIAGNOSIS — Z299 Encounter for prophylactic measures, unspecified: Secondary | ICD-10-CM | POA: Diagnosis not present

## 2019-03-23 DIAGNOSIS — F321 Major depressive disorder, single episode, moderate: Secondary | ICD-10-CM | POA: Diagnosis not present

## 2019-03-23 DIAGNOSIS — J449 Chronic obstructive pulmonary disease, unspecified: Secondary | ICD-10-CM | POA: Diagnosis not present

## 2019-03-23 DIAGNOSIS — Z6841 Body Mass Index (BMI) 40.0 and over, adult: Secondary | ICD-10-CM | POA: Diagnosis not present

## 2019-03-27 DIAGNOSIS — M1990 Unspecified osteoarthritis, unspecified site: Secondary | ICD-10-CM | POA: Diagnosis not present

## 2019-03-27 DIAGNOSIS — J449 Chronic obstructive pulmonary disease, unspecified: Secondary | ICD-10-CM | POA: Diagnosis not present

## 2019-03-27 DIAGNOSIS — I10 Essential (primary) hypertension: Secondary | ICD-10-CM | POA: Diagnosis not present

## 2019-03-27 DIAGNOSIS — Z6841 Body Mass Index (BMI) 40.0 and over, adult: Secondary | ICD-10-CM | POA: Diagnosis not present

## 2019-03-27 DIAGNOSIS — Z299 Encounter for prophylactic measures, unspecified: Secondary | ICD-10-CM | POA: Diagnosis not present

## 2019-03-27 DIAGNOSIS — M25561 Pain in right knee: Secondary | ICD-10-CM | POA: Diagnosis not present

## 2019-05-25 DIAGNOSIS — F322 Major depressive disorder, single episode, severe without psychotic features: Secondary | ICD-10-CM | POA: Diagnosis not present

## 2019-05-25 DIAGNOSIS — J449 Chronic obstructive pulmonary disease, unspecified: Secondary | ICD-10-CM | POA: Diagnosis not present

## 2019-05-25 DIAGNOSIS — Z299 Encounter for prophylactic measures, unspecified: Secondary | ICD-10-CM | POA: Diagnosis not present

## 2019-05-25 DIAGNOSIS — M25561 Pain in right knee: Secondary | ICD-10-CM | POA: Diagnosis not present

## 2019-05-25 DIAGNOSIS — I10 Essential (primary) hypertension: Secondary | ICD-10-CM | POA: Diagnosis not present

## 2019-05-25 DIAGNOSIS — Z6841 Body Mass Index (BMI) 40.0 and over, adult: Secondary | ICD-10-CM | POA: Diagnosis not present

## 2019-06-01 DIAGNOSIS — M1711 Unilateral primary osteoarthritis, right knee: Secondary | ICD-10-CM | POA: Diagnosis not present

## 2019-06-05 DIAGNOSIS — M1712 Unilateral primary osteoarthritis, left knee: Secondary | ICD-10-CM | POA: Diagnosis not present

## 2019-06-08 DIAGNOSIS — F321 Major depressive disorder, single episode, moderate: Secondary | ICD-10-CM | POA: Diagnosis not present

## 2019-06-08 DIAGNOSIS — M1711 Unilateral primary osteoarthritis, right knee: Secondary | ICD-10-CM | POA: Diagnosis not present

## 2019-06-08 DIAGNOSIS — Z299 Encounter for prophylactic measures, unspecified: Secondary | ICD-10-CM | POA: Diagnosis not present

## 2019-06-08 DIAGNOSIS — M1712 Unilateral primary osteoarthritis, left knee: Secondary | ICD-10-CM | POA: Diagnosis not present

## 2019-06-08 DIAGNOSIS — J449 Chronic obstructive pulmonary disease, unspecified: Secondary | ICD-10-CM | POA: Diagnosis not present

## 2019-06-11 DIAGNOSIS — M1712 Unilateral primary osteoarthritis, left knee: Secondary | ICD-10-CM | POA: Diagnosis not present

## 2019-06-15 DIAGNOSIS — M1711 Unilateral primary osteoarthritis, right knee: Secondary | ICD-10-CM | POA: Diagnosis not present

## 2019-06-19 DIAGNOSIS — M1712 Unilateral primary osteoarthritis, left knee: Secondary | ICD-10-CM | POA: Diagnosis not present

## 2019-06-22 DIAGNOSIS — M1711 Unilateral primary osteoarthritis, right knee: Secondary | ICD-10-CM | POA: Diagnosis not present

## 2019-06-26 DIAGNOSIS — M1712 Unilateral primary osteoarthritis, left knee: Secondary | ICD-10-CM | POA: Diagnosis not present

## 2019-06-29 DIAGNOSIS — M1711 Unilateral primary osteoarthritis, right knee: Secondary | ICD-10-CM | POA: Diagnosis not present

## 2019-07-03 DIAGNOSIS — M1712 Unilateral primary osteoarthritis, left knee: Secondary | ICD-10-CM | POA: Diagnosis not present

## 2019-07-06 DIAGNOSIS — M1711 Unilateral primary osteoarthritis, right knee: Secondary | ICD-10-CM | POA: Diagnosis not present

## 2019-07-09 DIAGNOSIS — M1712 Unilateral primary osteoarthritis, left knee: Secondary | ICD-10-CM | POA: Diagnosis not present

## 2019-09-21 DIAGNOSIS — M1990 Unspecified osteoarthritis, unspecified site: Secondary | ICD-10-CM | POA: Diagnosis not present

## 2019-09-21 DIAGNOSIS — J449 Chronic obstructive pulmonary disease, unspecified: Secondary | ICD-10-CM | POA: Diagnosis not present

## 2019-09-21 DIAGNOSIS — M545 Low back pain: Secondary | ICD-10-CM | POA: Diagnosis not present

## 2019-09-21 DIAGNOSIS — Z299 Encounter for prophylactic measures, unspecified: Secondary | ICD-10-CM | POA: Diagnosis not present

## 2019-09-21 DIAGNOSIS — I10 Essential (primary) hypertension: Secondary | ICD-10-CM | POA: Diagnosis not present

## 2019-11-14 DIAGNOSIS — Z79891 Long term (current) use of opiate analgesic: Secondary | ICD-10-CM | POA: Diagnosis not present

## 2019-11-14 DIAGNOSIS — Z6841 Body Mass Index (BMI) 40.0 and over, adult: Secondary | ICD-10-CM | POA: Diagnosis not present

## 2019-11-14 DIAGNOSIS — Z9981 Dependence on supplemental oxygen: Secondary | ICD-10-CM | POA: Diagnosis not present

## 2019-11-14 DIAGNOSIS — L97811 Non-pressure chronic ulcer of other part of right lower leg limited to breakdown of skin: Secondary | ICD-10-CM | POA: Diagnosis not present

## 2019-11-14 DIAGNOSIS — F321 Major depressive disorder, single episode, moderate: Secondary | ICD-10-CM | POA: Diagnosis not present

## 2019-11-14 DIAGNOSIS — L97821 Non-pressure chronic ulcer of other part of left lower leg limited to breakdown of skin: Secondary | ICD-10-CM | POA: Diagnosis not present

## 2019-11-14 DIAGNOSIS — J449 Chronic obstructive pulmonary disease, unspecified: Secondary | ICD-10-CM | POA: Diagnosis not present

## 2019-11-14 DIAGNOSIS — E7800 Pure hypercholesterolemia, unspecified: Secondary | ICD-10-CM | POA: Diagnosis not present

## 2019-11-14 DIAGNOSIS — M545 Low back pain: Secondary | ICD-10-CM | POA: Diagnosis not present

## 2019-11-14 DIAGNOSIS — J9610 Chronic respiratory failure, unspecified whether with hypoxia or hypercapnia: Secondary | ICD-10-CM | POA: Diagnosis not present

## 2019-11-14 DIAGNOSIS — I872 Venous insufficiency (chronic) (peripheral): Secondary | ICD-10-CM | POA: Diagnosis not present

## 2019-11-14 DIAGNOSIS — M8580 Other specified disorders of bone density and structure, unspecified site: Secondary | ICD-10-CM | POA: Diagnosis not present

## 2019-11-14 DIAGNOSIS — I10 Essential (primary) hypertension: Secondary | ICD-10-CM | POA: Diagnosis not present

## 2019-11-14 DIAGNOSIS — M170 Bilateral primary osteoarthritis of knee: Secondary | ICD-10-CM | POA: Diagnosis not present

## 2019-11-14 DIAGNOSIS — E559 Vitamin D deficiency, unspecified: Secondary | ICD-10-CM | POA: Diagnosis not present

## 2019-11-17 DIAGNOSIS — I10 Essential (primary) hypertension: Secondary | ICD-10-CM | POA: Diagnosis not present

## 2019-11-17 DIAGNOSIS — J449 Chronic obstructive pulmonary disease, unspecified: Secondary | ICD-10-CM | POA: Diagnosis not present

## 2019-11-17 DIAGNOSIS — I872 Venous insufficiency (chronic) (peripheral): Secondary | ICD-10-CM | POA: Diagnosis not present

## 2019-11-17 DIAGNOSIS — L97811 Non-pressure chronic ulcer of other part of right lower leg limited to breakdown of skin: Secondary | ICD-10-CM | POA: Diagnosis not present

## 2019-11-17 DIAGNOSIS — J9610 Chronic respiratory failure, unspecified whether with hypoxia or hypercapnia: Secondary | ICD-10-CM | POA: Diagnosis not present

## 2019-11-17 DIAGNOSIS — L97821 Non-pressure chronic ulcer of other part of left lower leg limited to breakdown of skin: Secondary | ICD-10-CM | POA: Diagnosis not present

## 2019-11-19 DIAGNOSIS — L97821 Non-pressure chronic ulcer of other part of left lower leg limited to breakdown of skin: Secondary | ICD-10-CM | POA: Diagnosis not present

## 2019-11-19 DIAGNOSIS — L97811 Non-pressure chronic ulcer of other part of right lower leg limited to breakdown of skin: Secondary | ICD-10-CM | POA: Diagnosis not present

## 2019-11-19 DIAGNOSIS — I872 Venous insufficiency (chronic) (peripheral): Secondary | ICD-10-CM | POA: Diagnosis not present

## 2019-11-19 DIAGNOSIS — I10 Essential (primary) hypertension: Secondary | ICD-10-CM | POA: Diagnosis not present

## 2019-11-19 DIAGNOSIS — J9610 Chronic respiratory failure, unspecified whether with hypoxia or hypercapnia: Secondary | ICD-10-CM | POA: Diagnosis not present

## 2019-11-19 DIAGNOSIS — J449 Chronic obstructive pulmonary disease, unspecified: Secondary | ICD-10-CM | POA: Diagnosis not present

## 2019-11-23 DIAGNOSIS — L97811 Non-pressure chronic ulcer of other part of right lower leg limited to breakdown of skin: Secondary | ICD-10-CM | POA: Diagnosis not present

## 2019-11-23 DIAGNOSIS — J9610 Chronic respiratory failure, unspecified whether with hypoxia or hypercapnia: Secondary | ICD-10-CM | POA: Diagnosis not present

## 2019-11-23 DIAGNOSIS — J449 Chronic obstructive pulmonary disease, unspecified: Secondary | ICD-10-CM | POA: Diagnosis not present

## 2019-11-23 DIAGNOSIS — L97821 Non-pressure chronic ulcer of other part of left lower leg limited to breakdown of skin: Secondary | ICD-10-CM | POA: Diagnosis not present

## 2019-11-23 DIAGNOSIS — I10 Essential (primary) hypertension: Secondary | ICD-10-CM | POA: Diagnosis not present

## 2019-11-23 DIAGNOSIS — I872 Venous insufficiency (chronic) (peripheral): Secondary | ICD-10-CM | POA: Diagnosis not present

## 2019-11-26 DIAGNOSIS — L97821 Non-pressure chronic ulcer of other part of left lower leg limited to breakdown of skin: Secondary | ICD-10-CM | POA: Diagnosis not present

## 2019-11-26 DIAGNOSIS — I10 Essential (primary) hypertension: Secondary | ICD-10-CM | POA: Diagnosis not present

## 2019-11-26 DIAGNOSIS — I872 Venous insufficiency (chronic) (peripheral): Secondary | ICD-10-CM | POA: Diagnosis not present

## 2019-11-26 DIAGNOSIS — J449 Chronic obstructive pulmonary disease, unspecified: Secondary | ICD-10-CM | POA: Diagnosis not present

## 2019-11-26 DIAGNOSIS — L97811 Non-pressure chronic ulcer of other part of right lower leg limited to breakdown of skin: Secondary | ICD-10-CM | POA: Diagnosis not present

## 2019-11-26 DIAGNOSIS — J9610 Chronic respiratory failure, unspecified whether with hypoxia or hypercapnia: Secondary | ICD-10-CM | POA: Diagnosis not present

## 2019-11-27 DIAGNOSIS — L97821 Non-pressure chronic ulcer of other part of left lower leg limited to breakdown of skin: Secondary | ICD-10-CM | POA: Diagnosis not present

## 2019-11-27 DIAGNOSIS — J9610 Chronic respiratory failure, unspecified whether with hypoxia or hypercapnia: Secondary | ICD-10-CM | POA: Diagnosis not present

## 2019-11-27 DIAGNOSIS — J449 Chronic obstructive pulmonary disease, unspecified: Secondary | ICD-10-CM | POA: Diagnosis not present

## 2019-11-27 DIAGNOSIS — L97811 Non-pressure chronic ulcer of other part of right lower leg limited to breakdown of skin: Secondary | ICD-10-CM | POA: Diagnosis not present

## 2019-11-27 DIAGNOSIS — I10 Essential (primary) hypertension: Secondary | ICD-10-CM | POA: Diagnosis not present

## 2019-11-27 DIAGNOSIS — I872 Venous insufficiency (chronic) (peripheral): Secondary | ICD-10-CM | POA: Diagnosis not present

## 2019-12-01 DIAGNOSIS — L97821 Non-pressure chronic ulcer of other part of left lower leg limited to breakdown of skin: Secondary | ICD-10-CM | POA: Diagnosis not present

## 2019-12-01 DIAGNOSIS — L97811 Non-pressure chronic ulcer of other part of right lower leg limited to breakdown of skin: Secondary | ICD-10-CM | POA: Diagnosis not present

## 2019-12-01 DIAGNOSIS — I872 Venous insufficiency (chronic) (peripheral): Secondary | ICD-10-CM | POA: Diagnosis not present

## 2019-12-01 DIAGNOSIS — I10 Essential (primary) hypertension: Secondary | ICD-10-CM | POA: Diagnosis not present

## 2019-12-01 DIAGNOSIS — J9610 Chronic respiratory failure, unspecified whether with hypoxia or hypercapnia: Secondary | ICD-10-CM | POA: Diagnosis not present

## 2019-12-01 DIAGNOSIS — J449 Chronic obstructive pulmonary disease, unspecified: Secondary | ICD-10-CM | POA: Diagnosis not present

## 2019-12-02 DIAGNOSIS — L97811 Non-pressure chronic ulcer of other part of right lower leg limited to breakdown of skin: Secondary | ICD-10-CM | POA: Diagnosis not present

## 2019-12-02 DIAGNOSIS — J449 Chronic obstructive pulmonary disease, unspecified: Secondary | ICD-10-CM | POA: Diagnosis not present

## 2019-12-02 DIAGNOSIS — J9610 Chronic respiratory failure, unspecified whether with hypoxia or hypercapnia: Secondary | ICD-10-CM | POA: Diagnosis not present

## 2019-12-02 DIAGNOSIS — I872 Venous insufficiency (chronic) (peripheral): Secondary | ICD-10-CM | POA: Diagnosis not present

## 2019-12-02 DIAGNOSIS — I10 Essential (primary) hypertension: Secondary | ICD-10-CM | POA: Diagnosis not present

## 2019-12-02 DIAGNOSIS — L97821 Non-pressure chronic ulcer of other part of left lower leg limited to breakdown of skin: Secondary | ICD-10-CM | POA: Diagnosis not present

## 2019-12-08 DIAGNOSIS — L97821 Non-pressure chronic ulcer of other part of left lower leg limited to breakdown of skin: Secondary | ICD-10-CM | POA: Diagnosis not present

## 2019-12-08 DIAGNOSIS — I10 Essential (primary) hypertension: Secondary | ICD-10-CM | POA: Diagnosis not present

## 2019-12-08 DIAGNOSIS — L97811 Non-pressure chronic ulcer of other part of right lower leg limited to breakdown of skin: Secondary | ICD-10-CM | POA: Diagnosis not present

## 2019-12-08 DIAGNOSIS — J9610 Chronic respiratory failure, unspecified whether with hypoxia or hypercapnia: Secondary | ICD-10-CM | POA: Diagnosis not present

## 2019-12-08 DIAGNOSIS — J449 Chronic obstructive pulmonary disease, unspecified: Secondary | ICD-10-CM | POA: Diagnosis not present

## 2019-12-08 DIAGNOSIS — I872 Venous insufficiency (chronic) (peripheral): Secondary | ICD-10-CM | POA: Diagnosis not present

## 2019-12-09 DIAGNOSIS — I872 Venous insufficiency (chronic) (peripheral): Secondary | ICD-10-CM | POA: Diagnosis not present

## 2019-12-09 DIAGNOSIS — J449 Chronic obstructive pulmonary disease, unspecified: Secondary | ICD-10-CM | POA: Diagnosis not present

## 2019-12-09 DIAGNOSIS — J9610 Chronic respiratory failure, unspecified whether with hypoxia or hypercapnia: Secondary | ICD-10-CM | POA: Diagnosis not present

## 2019-12-09 DIAGNOSIS — I10 Essential (primary) hypertension: Secondary | ICD-10-CM | POA: Diagnosis not present

## 2019-12-09 DIAGNOSIS — L97811 Non-pressure chronic ulcer of other part of right lower leg limited to breakdown of skin: Secondary | ICD-10-CM | POA: Diagnosis not present

## 2019-12-09 DIAGNOSIS — L97821 Non-pressure chronic ulcer of other part of left lower leg limited to breakdown of skin: Secondary | ICD-10-CM | POA: Diagnosis not present

## 2019-12-14 DIAGNOSIS — E559 Vitamin D deficiency, unspecified: Secondary | ICD-10-CM | POA: Diagnosis not present

## 2019-12-14 DIAGNOSIS — L97811 Non-pressure chronic ulcer of other part of right lower leg limited to breakdown of skin: Secondary | ICD-10-CM | POA: Diagnosis not present

## 2019-12-14 DIAGNOSIS — Z9981 Dependence on supplemental oxygen: Secondary | ICD-10-CM | POA: Diagnosis not present

## 2019-12-14 DIAGNOSIS — I872 Venous insufficiency (chronic) (peripheral): Secondary | ICD-10-CM | POA: Diagnosis not present

## 2019-12-14 DIAGNOSIS — L97821 Non-pressure chronic ulcer of other part of left lower leg limited to breakdown of skin: Secondary | ICD-10-CM | POA: Diagnosis not present

## 2019-12-14 DIAGNOSIS — M545 Low back pain: Secondary | ICD-10-CM | POA: Diagnosis not present

## 2019-12-14 DIAGNOSIS — F321 Major depressive disorder, single episode, moderate: Secondary | ICD-10-CM | POA: Diagnosis not present

## 2019-12-14 DIAGNOSIS — M8580 Other specified disorders of bone density and structure, unspecified site: Secondary | ICD-10-CM | POA: Diagnosis not present

## 2019-12-14 DIAGNOSIS — E7800 Pure hypercholesterolemia, unspecified: Secondary | ICD-10-CM | POA: Diagnosis not present

## 2019-12-14 DIAGNOSIS — I10 Essential (primary) hypertension: Secondary | ICD-10-CM | POA: Diagnosis not present

## 2019-12-14 DIAGNOSIS — Z79891 Long term (current) use of opiate analgesic: Secondary | ICD-10-CM | POA: Diagnosis not present

## 2019-12-14 DIAGNOSIS — J449 Chronic obstructive pulmonary disease, unspecified: Secondary | ICD-10-CM | POA: Diagnosis not present

## 2019-12-14 DIAGNOSIS — Z6841 Body Mass Index (BMI) 40.0 and over, adult: Secondary | ICD-10-CM | POA: Diagnosis not present

## 2019-12-14 DIAGNOSIS — M170 Bilateral primary osteoarthritis of knee: Secondary | ICD-10-CM | POA: Diagnosis not present

## 2019-12-14 DIAGNOSIS — J9610 Chronic respiratory failure, unspecified whether with hypoxia or hypercapnia: Secondary | ICD-10-CM | POA: Diagnosis not present

## 2019-12-18 DIAGNOSIS — J449 Chronic obstructive pulmonary disease, unspecified: Secondary | ICD-10-CM | POA: Diagnosis not present

## 2019-12-18 DIAGNOSIS — L97811 Non-pressure chronic ulcer of other part of right lower leg limited to breakdown of skin: Secondary | ICD-10-CM | POA: Diagnosis not present

## 2019-12-18 DIAGNOSIS — I10 Essential (primary) hypertension: Secondary | ICD-10-CM | POA: Diagnosis not present

## 2019-12-18 DIAGNOSIS — J9610 Chronic respiratory failure, unspecified whether with hypoxia or hypercapnia: Secondary | ICD-10-CM | POA: Diagnosis not present

## 2019-12-18 DIAGNOSIS — L97821 Non-pressure chronic ulcer of other part of left lower leg limited to breakdown of skin: Secondary | ICD-10-CM | POA: Diagnosis not present

## 2019-12-18 DIAGNOSIS — I872 Venous insufficiency (chronic) (peripheral): Secondary | ICD-10-CM | POA: Diagnosis not present

## 2019-12-23 DIAGNOSIS — J449 Chronic obstructive pulmonary disease, unspecified: Secondary | ICD-10-CM | POA: Diagnosis not present

## 2019-12-23 DIAGNOSIS — I872 Venous insufficiency (chronic) (peripheral): Secondary | ICD-10-CM | POA: Diagnosis not present

## 2019-12-23 DIAGNOSIS — J9610 Chronic respiratory failure, unspecified whether with hypoxia or hypercapnia: Secondary | ICD-10-CM | POA: Diagnosis not present

## 2019-12-23 DIAGNOSIS — L97811 Non-pressure chronic ulcer of other part of right lower leg limited to breakdown of skin: Secondary | ICD-10-CM | POA: Diagnosis not present

## 2019-12-23 DIAGNOSIS — L97821 Non-pressure chronic ulcer of other part of left lower leg limited to breakdown of skin: Secondary | ICD-10-CM | POA: Diagnosis not present

## 2019-12-23 DIAGNOSIS — I10 Essential (primary) hypertension: Secondary | ICD-10-CM | POA: Diagnosis not present

## 2019-12-30 DIAGNOSIS — Z6841 Body Mass Index (BMI) 40.0 and over, adult: Secondary | ICD-10-CM | POA: Diagnosis not present

## 2019-12-30 DIAGNOSIS — Z299 Encounter for prophylactic measures, unspecified: Secondary | ICD-10-CM | POA: Diagnosis not present

## 2019-12-30 DIAGNOSIS — Z7189 Other specified counseling: Secondary | ICD-10-CM | POA: Diagnosis not present

## 2019-12-30 DIAGNOSIS — Z79899 Other long term (current) drug therapy: Secondary | ICD-10-CM | POA: Diagnosis not present

## 2019-12-30 DIAGNOSIS — I10 Essential (primary) hypertension: Secondary | ICD-10-CM | POA: Diagnosis not present

## 2019-12-30 DIAGNOSIS — R5383 Other fatigue: Secondary | ICD-10-CM | POA: Diagnosis not present

## 2019-12-30 DIAGNOSIS — Z1339 Encounter for screening examination for other mental health and behavioral disorders: Secondary | ICD-10-CM | POA: Diagnosis not present

## 2019-12-30 DIAGNOSIS — Z0000 Encounter for general adult medical examination without abnormal findings: Secondary | ICD-10-CM | POA: Diagnosis not present

## 2019-12-30 DIAGNOSIS — Z789 Other specified health status: Secondary | ICD-10-CM | POA: Diagnosis not present

## 2019-12-30 DIAGNOSIS — E7800 Pure hypercholesterolemia, unspecified: Secondary | ICD-10-CM | POA: Diagnosis not present

## 2019-12-30 DIAGNOSIS — Z1331 Encounter for screening for depression: Secondary | ICD-10-CM | POA: Diagnosis not present

## 2019-12-31 DIAGNOSIS — L97811 Non-pressure chronic ulcer of other part of right lower leg limited to breakdown of skin: Secondary | ICD-10-CM | POA: Diagnosis not present

## 2019-12-31 DIAGNOSIS — J449 Chronic obstructive pulmonary disease, unspecified: Secondary | ICD-10-CM | POA: Diagnosis not present

## 2019-12-31 DIAGNOSIS — L97821 Non-pressure chronic ulcer of other part of left lower leg limited to breakdown of skin: Secondary | ICD-10-CM | POA: Diagnosis not present

## 2019-12-31 DIAGNOSIS — I872 Venous insufficiency (chronic) (peripheral): Secondary | ICD-10-CM | POA: Diagnosis not present

## 2019-12-31 DIAGNOSIS — I10 Essential (primary) hypertension: Secondary | ICD-10-CM | POA: Diagnosis not present

## 2019-12-31 DIAGNOSIS — J9610 Chronic respiratory failure, unspecified whether with hypoxia or hypercapnia: Secondary | ICD-10-CM | POA: Diagnosis not present

## 2020-01-04 DIAGNOSIS — I10 Essential (primary) hypertension: Secondary | ICD-10-CM | POA: Diagnosis not present

## 2020-01-04 DIAGNOSIS — J449 Chronic obstructive pulmonary disease, unspecified: Secondary | ICD-10-CM | POA: Diagnosis not present

## 2020-01-04 DIAGNOSIS — J9610 Chronic respiratory failure, unspecified whether with hypoxia or hypercapnia: Secondary | ICD-10-CM | POA: Diagnosis not present

## 2020-01-04 DIAGNOSIS — L97811 Non-pressure chronic ulcer of other part of right lower leg limited to breakdown of skin: Secondary | ICD-10-CM | POA: Diagnosis not present

## 2020-01-04 DIAGNOSIS — I872 Venous insufficiency (chronic) (peripheral): Secondary | ICD-10-CM | POA: Diagnosis not present

## 2020-01-04 DIAGNOSIS — L97821 Non-pressure chronic ulcer of other part of left lower leg limited to breakdown of skin: Secondary | ICD-10-CM | POA: Diagnosis not present

## 2020-01-12 DIAGNOSIS — Z23 Encounter for immunization: Secondary | ICD-10-CM | POA: Diagnosis not present

## 2020-02-04 DIAGNOSIS — I10 Essential (primary) hypertension: Secondary | ICD-10-CM | POA: Diagnosis not present

## 2020-02-04 DIAGNOSIS — M1990 Unspecified osteoarthritis, unspecified site: Secondary | ICD-10-CM | POA: Diagnosis not present

## 2020-02-04 DIAGNOSIS — J449 Chronic obstructive pulmonary disease, unspecified: Secondary | ICD-10-CM | POA: Diagnosis not present

## 2020-02-09 DIAGNOSIS — Z23 Encounter for immunization: Secondary | ICD-10-CM | POA: Diagnosis not present

## 2020-04-01 DIAGNOSIS — J449 Chronic obstructive pulmonary disease, unspecified: Secondary | ICD-10-CM | POA: Diagnosis not present

## 2020-04-01 DIAGNOSIS — M25552 Pain in left hip: Secondary | ICD-10-CM | POA: Diagnosis not present

## 2020-04-01 DIAGNOSIS — Z299 Encounter for prophylactic measures, unspecified: Secondary | ICD-10-CM | POA: Diagnosis not present

## 2020-04-01 DIAGNOSIS — M1710 Unilateral primary osteoarthritis, unspecified knee: Secondary | ICD-10-CM | POA: Diagnosis not present

## 2020-04-01 DIAGNOSIS — I10 Essential (primary) hypertension: Secondary | ICD-10-CM | POA: Diagnosis not present

## 2020-04-05 DIAGNOSIS — I10 Essential (primary) hypertension: Secondary | ICD-10-CM | POA: Diagnosis not present

## 2020-04-05 DIAGNOSIS — M1990 Unspecified osteoarthritis, unspecified site: Secondary | ICD-10-CM | POA: Diagnosis not present

## 2020-04-21 DIAGNOSIS — M7989 Other specified soft tissue disorders: Secondary | ICD-10-CM | POA: Diagnosis not present

## 2020-04-21 DIAGNOSIS — R600 Localized edema: Secondary | ICD-10-CM | POA: Diagnosis not present

## 2020-04-21 DIAGNOSIS — I824Y2 Acute embolism and thrombosis of unspecified deep veins of left proximal lower extremity: Secondary | ICD-10-CM | POA: Diagnosis not present

## 2020-04-21 DIAGNOSIS — I10 Essential (primary) hypertension: Secondary | ICD-10-CM | POA: Diagnosis not present

## 2020-04-21 DIAGNOSIS — R609 Edema, unspecified: Secondary | ICD-10-CM | POA: Diagnosis not present

## 2020-04-21 DIAGNOSIS — Z299 Encounter for prophylactic measures, unspecified: Secondary | ICD-10-CM | POA: Diagnosis not present

## 2020-04-21 DIAGNOSIS — I82402 Acute embolism and thrombosis of unspecified deep veins of left lower extremity: Secondary | ICD-10-CM | POA: Diagnosis not present

## 2020-04-21 DIAGNOSIS — Z23 Encounter for immunization: Secondary | ICD-10-CM | POA: Diagnosis not present

## 2020-04-21 DIAGNOSIS — R948 Abnormal results of function studies of other organs and systems: Secondary | ICD-10-CM | POA: Diagnosis not present

## 2020-04-25 DIAGNOSIS — I82412 Acute embolism and thrombosis of left femoral vein: Secondary | ICD-10-CM | POA: Diagnosis not present

## 2020-04-25 DIAGNOSIS — I10 Essential (primary) hypertension: Secondary | ICD-10-CM | POA: Diagnosis not present

## 2020-04-25 DIAGNOSIS — Z299 Encounter for prophylactic measures, unspecified: Secondary | ICD-10-CM | POA: Diagnosis not present

## 2020-04-25 DIAGNOSIS — R609 Edema, unspecified: Secondary | ICD-10-CM | POA: Diagnosis not present

## 2020-05-06 DIAGNOSIS — M1990 Unspecified osteoarthritis, unspecified site: Secondary | ICD-10-CM | POA: Diagnosis not present

## 2020-05-06 DIAGNOSIS — I10 Essential (primary) hypertension: Secondary | ICD-10-CM | POA: Diagnosis not present

## 2020-05-16 DIAGNOSIS — F322 Major depressive disorder, single episode, severe without psychotic features: Secondary | ICD-10-CM | POA: Diagnosis not present

## 2020-05-16 DIAGNOSIS — Z6841 Body Mass Index (BMI) 40.0 and over, adult: Secondary | ICD-10-CM | POA: Diagnosis not present

## 2020-05-16 DIAGNOSIS — I10 Essential (primary) hypertension: Secondary | ICD-10-CM | POA: Diagnosis not present

## 2020-05-16 DIAGNOSIS — I82412 Acute embolism and thrombosis of left femoral vein: Secondary | ICD-10-CM | POA: Diagnosis not present

## 2020-05-16 DIAGNOSIS — Z299 Encounter for prophylactic measures, unspecified: Secondary | ICD-10-CM | POA: Diagnosis not present

## 2020-05-16 DIAGNOSIS — J449 Chronic obstructive pulmonary disease, unspecified: Secondary | ICD-10-CM | POA: Diagnosis not present

## 2020-06-06 DIAGNOSIS — M1990 Unspecified osteoarthritis, unspecified site: Secondary | ICD-10-CM | POA: Diagnosis not present

## 2020-06-06 DIAGNOSIS — I10 Essential (primary) hypertension: Secondary | ICD-10-CM | POA: Diagnosis not present

## 2020-07-05 DIAGNOSIS — M1710 Unilateral primary osteoarthritis, unspecified knee: Secondary | ICD-10-CM | POA: Diagnosis not present

## 2020-07-05 DIAGNOSIS — Z299 Encounter for prophylactic measures, unspecified: Secondary | ICD-10-CM | POA: Diagnosis not present

## 2020-07-05 DIAGNOSIS — J449 Chronic obstructive pulmonary disease, unspecified: Secondary | ICD-10-CM | POA: Diagnosis not present

## 2020-07-05 DIAGNOSIS — Z79899 Other long term (current) drug therapy: Secondary | ICD-10-CM | POA: Diagnosis not present

## 2020-07-05 DIAGNOSIS — I10 Essential (primary) hypertension: Secondary | ICD-10-CM | POA: Diagnosis not present

## 2020-07-05 DIAGNOSIS — N939 Abnormal uterine and vaginal bleeding, unspecified: Secondary | ICD-10-CM | POA: Diagnosis not present

## 2020-07-05 DIAGNOSIS — Z6841 Body Mass Index (BMI) 40.0 and over, adult: Secondary | ICD-10-CM | POA: Diagnosis not present

## 2020-07-21 DIAGNOSIS — N939 Abnormal uterine and vaginal bleeding, unspecified: Secondary | ICD-10-CM | POA: Diagnosis not present

## 2020-07-21 DIAGNOSIS — N950 Postmenopausal bleeding: Secondary | ICD-10-CM | POA: Diagnosis not present

## 2020-08-02 DIAGNOSIS — N939 Abnormal uterine and vaginal bleeding, unspecified: Secondary | ICD-10-CM | POA: Diagnosis not present

## 2020-08-02 DIAGNOSIS — R9389 Abnormal findings on diagnostic imaging of other specified body structures: Secondary | ICD-10-CM | POA: Diagnosis not present

## 2020-08-02 DIAGNOSIS — N950 Postmenopausal bleeding: Secondary | ICD-10-CM | POA: Diagnosis not present

## 2020-08-02 DIAGNOSIS — N888 Other specified noninflammatory disorders of cervix uteri: Secondary | ICD-10-CM | POA: Diagnosis not present

## 2020-08-15 DIAGNOSIS — Z01818 Encounter for other preprocedural examination: Secondary | ICD-10-CM | POA: Diagnosis not present

## 2020-08-16 DIAGNOSIS — I4891 Unspecified atrial fibrillation: Secondary | ICD-10-CM | POA: Diagnosis not present

## 2020-08-16 DIAGNOSIS — Z01810 Encounter for preprocedural cardiovascular examination: Secondary | ICD-10-CM | POA: Diagnosis not present

## 2020-08-16 DIAGNOSIS — N950 Postmenopausal bleeding: Secondary | ICD-10-CM | POA: Diagnosis not present

## 2020-08-16 DIAGNOSIS — Z01818 Encounter for other preprocedural examination: Secondary | ICD-10-CM | POA: Diagnosis not present

## 2020-08-24 DIAGNOSIS — I4811 Longstanding persistent atrial fibrillation: Secondary | ICD-10-CM | POA: Diagnosis not present

## 2020-08-24 DIAGNOSIS — R0602 Shortness of breath: Secondary | ICD-10-CM | POA: Diagnosis not present

## 2020-08-24 DIAGNOSIS — Z6841 Body Mass Index (BMI) 40.0 and over, adult: Secondary | ICD-10-CM | POA: Diagnosis not present

## 2020-08-24 DIAGNOSIS — N939 Abnormal uterine and vaginal bleeding, unspecified: Secondary | ICD-10-CM | POA: Diagnosis not present

## 2020-09-03 DIAGNOSIS — M1990 Unspecified osteoarthritis, unspecified site: Secondary | ICD-10-CM | POA: Diagnosis not present

## 2020-09-03 DIAGNOSIS — I10 Essential (primary) hypertension: Secondary | ICD-10-CM | POA: Diagnosis not present

## 2020-09-07 DIAGNOSIS — I4891 Unspecified atrial fibrillation: Secondary | ICD-10-CM | POA: Diagnosis not present

## 2020-09-07 DIAGNOSIS — Z01812 Encounter for preprocedural laboratory examination: Secondary | ICD-10-CM | POA: Diagnosis not present

## 2020-09-09 DIAGNOSIS — N189 Chronic kidney disease, unspecified: Secondary | ICD-10-CM | POA: Diagnosis not present

## 2020-09-09 DIAGNOSIS — G4730 Sleep apnea, unspecified: Secondary | ICD-10-CM | POA: Diagnosis not present

## 2020-09-09 DIAGNOSIS — I4891 Unspecified atrial fibrillation: Secondary | ICD-10-CM | POA: Diagnosis not present

## 2020-09-09 DIAGNOSIS — N950 Postmenopausal bleeding: Secondary | ICD-10-CM | POA: Diagnosis not present

## 2020-09-09 DIAGNOSIS — I129 Hypertensive chronic kidney disease with stage 1 through stage 4 chronic kidney disease, or unspecified chronic kidney disease: Secondary | ICD-10-CM | POA: Diagnosis not present

## 2020-09-09 DIAGNOSIS — E669 Obesity, unspecified: Secondary | ICD-10-CM | POA: Diagnosis not present

## 2020-09-13 DIAGNOSIS — R531 Weakness: Secondary | ICD-10-CM | POA: Diagnosis not present

## 2020-09-13 DIAGNOSIS — R5381 Other malaise: Secondary | ICD-10-CM | POA: Diagnosis not present

## 2020-09-13 DIAGNOSIS — W19XXXA Unspecified fall, initial encounter: Secondary | ICD-10-CM | POA: Diagnosis not present

## 2020-09-21 DIAGNOSIS — I4811 Longstanding persistent atrial fibrillation: Secondary | ICD-10-CM | POA: Diagnosis not present

## 2020-09-21 DIAGNOSIS — I071 Rheumatic tricuspid insufficiency: Secondary | ICD-10-CM | POA: Diagnosis not present

## 2020-09-22 DIAGNOSIS — I4821 Permanent atrial fibrillation: Secondary | ICD-10-CM | POA: Diagnosis not present

## 2020-09-22 DIAGNOSIS — I10 Essential (primary) hypertension: Secondary | ICD-10-CM | POA: Diagnosis not present

## 2020-09-26 DIAGNOSIS — Z09 Encounter for follow-up examination after completed treatment for conditions other than malignant neoplasm: Secondary | ICD-10-CM | POA: Diagnosis not present

## 2020-10-12 DIAGNOSIS — L97919 Non-pressure chronic ulcer of unspecified part of right lower leg with unspecified severity: Secondary | ICD-10-CM | POA: Diagnosis not present

## 2020-10-12 DIAGNOSIS — J449 Chronic obstructive pulmonary disease, unspecified: Secondary | ICD-10-CM | POA: Diagnosis not present

## 2020-10-12 DIAGNOSIS — Z299 Encounter for prophylactic measures, unspecified: Secondary | ICD-10-CM | POA: Diagnosis not present

## 2020-10-12 DIAGNOSIS — I10 Essential (primary) hypertension: Secondary | ICD-10-CM | POA: Diagnosis not present

## 2020-11-16 DIAGNOSIS — I872 Venous insufficiency (chronic) (peripheral): Secondary | ICD-10-CM | POA: Diagnosis not present

## 2020-11-29 DIAGNOSIS — Z299 Encounter for prophylactic measures, unspecified: Secondary | ICD-10-CM | POA: Diagnosis not present

## 2020-11-29 DIAGNOSIS — B372 Candidiasis of skin and nail: Secondary | ICD-10-CM | POA: Diagnosis not present

## 2020-11-29 DIAGNOSIS — L97919 Non-pressure chronic ulcer of unspecified part of right lower leg with unspecified severity: Secondary | ICD-10-CM | POA: Diagnosis not present

## 2020-11-29 DIAGNOSIS — I10 Essential (primary) hypertension: Secondary | ICD-10-CM | POA: Diagnosis not present

## 2020-12-04 DIAGNOSIS — I10 Essential (primary) hypertension: Secondary | ICD-10-CM | POA: Diagnosis not present

## 2020-12-04 DIAGNOSIS — M1990 Unspecified osteoarthritis, unspecified site: Secondary | ICD-10-CM | POA: Diagnosis not present

## 2020-12-15 DIAGNOSIS — I872 Venous insufficiency (chronic) (peripheral): Secondary | ICD-10-CM | POA: Diagnosis not present

## 2021-01-02 DIAGNOSIS — R5383 Other fatigue: Secondary | ICD-10-CM | POA: Diagnosis not present

## 2021-01-02 DIAGNOSIS — Z79899 Other long term (current) drug therapy: Secondary | ICD-10-CM | POA: Diagnosis not present

## 2021-01-02 DIAGNOSIS — Z6841 Body Mass Index (BMI) 40.0 and over, adult: Secondary | ICD-10-CM | POA: Diagnosis not present

## 2021-01-02 DIAGNOSIS — F322 Major depressive disorder, single episode, severe without psychotic features: Secondary | ICD-10-CM | POA: Diagnosis not present

## 2021-01-02 DIAGNOSIS — Z299 Encounter for prophylactic measures, unspecified: Secondary | ICD-10-CM | POA: Diagnosis not present

## 2021-01-02 DIAGNOSIS — Z0000 Encounter for general adult medical examination without abnormal findings: Secondary | ICD-10-CM | POA: Diagnosis not present

## 2021-01-02 DIAGNOSIS — E7800 Pure hypercholesterolemia, unspecified: Secondary | ICD-10-CM | POA: Diagnosis not present

## 2021-01-02 DIAGNOSIS — Z7189 Other specified counseling: Secondary | ICD-10-CM | POA: Diagnosis not present

## 2021-01-02 DIAGNOSIS — I10 Essential (primary) hypertension: Secondary | ICD-10-CM | POA: Diagnosis not present

## 2021-01-02 DIAGNOSIS — Z1331 Encounter for screening for depression: Secondary | ICD-10-CM | POA: Diagnosis not present

## 2021-01-02 DIAGNOSIS — Z1339 Encounter for screening examination for other mental health and behavioral disorders: Secondary | ICD-10-CM | POA: Diagnosis not present

## 2021-01-02 DIAGNOSIS — Z789 Other specified health status: Secondary | ICD-10-CM | POA: Diagnosis not present

## 2021-01-17 DIAGNOSIS — I10 Essential (primary) hypertension: Secondary | ICD-10-CM | POA: Diagnosis not present

## 2021-01-17 DIAGNOSIS — I4821 Permanent atrial fibrillation: Secondary | ICD-10-CM | POA: Diagnosis not present

## 2021-02-03 DIAGNOSIS — I10 Essential (primary) hypertension: Secondary | ICD-10-CM | POA: Diagnosis not present

## 2021-02-03 DIAGNOSIS — E669 Obesity, unspecified: Secondary | ICD-10-CM | POA: Diagnosis not present

## 2021-02-27 DIAGNOSIS — Z23 Encounter for immunization: Secondary | ICD-10-CM | POA: Diagnosis not present

## 2021-02-27 DIAGNOSIS — M179 Osteoarthritis of knee, unspecified: Secondary | ICD-10-CM | POA: Diagnosis not present

## 2021-02-27 DIAGNOSIS — I10 Essential (primary) hypertension: Secondary | ICD-10-CM | POA: Diagnosis not present

## 2021-02-27 DIAGNOSIS — L97919 Non-pressure chronic ulcer of unspecified part of right lower leg with unspecified severity: Secondary | ICD-10-CM | POA: Diagnosis not present

## 2021-02-27 DIAGNOSIS — E7800 Pure hypercholesterolemia, unspecified: Secondary | ICD-10-CM | POA: Diagnosis not present

## 2021-02-27 DIAGNOSIS — Z299 Encounter for prophylactic measures, unspecified: Secondary | ICD-10-CM | POA: Diagnosis not present

## 2021-03-01 DIAGNOSIS — I509 Heart failure, unspecified: Secondary | ICD-10-CM | POA: Diagnosis not present

## 2021-03-22 DIAGNOSIS — J209 Acute bronchitis, unspecified: Secondary | ICD-10-CM | POA: Diagnosis not present

## 2021-03-22 DIAGNOSIS — Z299 Encounter for prophylactic measures, unspecified: Secondary | ICD-10-CM | POA: Diagnosis not present

## 2021-03-22 DIAGNOSIS — I10 Essential (primary) hypertension: Secondary | ICD-10-CM | POA: Diagnosis not present

## 2021-03-22 DIAGNOSIS — M25561 Pain in right knee: Secondary | ICD-10-CM | POA: Diagnosis not present

## 2021-03-22 DIAGNOSIS — M1990 Unspecified osteoarthritis, unspecified site: Secondary | ICD-10-CM | POA: Diagnosis not present

## 2021-03-24 DIAGNOSIS — I878 Other specified disorders of veins: Secondary | ICD-10-CM | POA: Diagnosis not present

## 2021-03-24 DIAGNOSIS — Z9981 Dependence on supplemental oxygen: Secondary | ICD-10-CM | POA: Diagnosis not present

## 2021-03-24 DIAGNOSIS — J45909 Unspecified asthma, uncomplicated: Secondary | ICD-10-CM | POA: Diagnosis not present

## 2021-03-24 DIAGNOSIS — S81802A Unspecified open wound, left lower leg, initial encounter: Secondary | ICD-10-CM | POA: Diagnosis not present

## 2021-03-24 DIAGNOSIS — Z993 Dependence on wheelchair: Secondary | ICD-10-CM | POA: Diagnosis not present

## 2021-03-24 DIAGNOSIS — I4891 Unspecified atrial fibrillation: Secondary | ICD-10-CM | POA: Diagnosis not present

## 2021-03-24 DIAGNOSIS — Z7901 Long term (current) use of anticoagulants: Secondary | ICD-10-CM | POA: Diagnosis not present

## 2021-03-24 DIAGNOSIS — S81801A Unspecified open wound, right lower leg, initial encounter: Secondary | ICD-10-CM | POA: Diagnosis not present

## 2021-03-24 DIAGNOSIS — F32A Depression, unspecified: Secondary | ICD-10-CM | POA: Diagnosis not present

## 2021-03-24 DIAGNOSIS — Z86718 Personal history of other venous thrombosis and embolism: Secondary | ICD-10-CM | POA: Diagnosis not present

## 2021-03-24 DIAGNOSIS — Z887 Allergy status to serum and vaccine status: Secondary | ICD-10-CM | POA: Diagnosis not present

## 2021-03-24 DIAGNOSIS — I890 Lymphedema, not elsewhere classified: Secondary | ICD-10-CM | POA: Diagnosis not present

## 2021-03-24 DIAGNOSIS — M1990 Unspecified osteoarthritis, unspecified site: Secondary | ICD-10-CM | POA: Diagnosis not present

## 2021-03-24 DIAGNOSIS — I10 Essential (primary) hypertension: Secondary | ICD-10-CM | POA: Diagnosis not present

## 2021-03-24 DIAGNOSIS — Z79899 Other long term (current) drug therapy: Secondary | ICD-10-CM | POA: Diagnosis not present

## 2021-04-04 DIAGNOSIS — R600 Localized edema: Secondary | ICD-10-CM | POA: Diagnosis not present

## 2021-04-04 DIAGNOSIS — I890 Lymphedema, not elsewhere classified: Secondary | ICD-10-CM | POA: Diagnosis not present

## 2021-04-21 DIAGNOSIS — S81801A Unspecified open wound, right lower leg, initial encounter: Secondary | ICD-10-CM | POA: Diagnosis not present

## 2021-04-21 DIAGNOSIS — S81802A Unspecified open wound, left lower leg, initial encounter: Secondary | ICD-10-CM | POA: Diagnosis not present

## 2021-04-21 DIAGNOSIS — I4891 Unspecified atrial fibrillation: Secondary | ICD-10-CM | POA: Diagnosis not present

## 2021-04-21 DIAGNOSIS — I878 Other specified disorders of veins: Secondary | ICD-10-CM | POA: Diagnosis not present

## 2021-04-21 DIAGNOSIS — I10 Essential (primary) hypertension: Secondary | ICD-10-CM | POA: Diagnosis not present

## 2021-04-21 DIAGNOSIS — I890 Lymphedema, not elsewhere classified: Secondary | ICD-10-CM | POA: Diagnosis not present

## 2021-04-25 DIAGNOSIS — M79662 Pain in left lower leg: Secondary | ICD-10-CM | POA: Diagnosis not present

## 2021-04-25 DIAGNOSIS — M7989 Other specified soft tissue disorders: Secondary | ICD-10-CM | POA: Diagnosis not present

## 2021-04-25 DIAGNOSIS — I890 Lymphedema, not elsewhere classified: Secondary | ICD-10-CM | POA: Diagnosis not present

## 2021-04-25 DIAGNOSIS — R9439 Abnormal result of other cardiovascular function study: Secondary | ICD-10-CM | POA: Diagnosis not present

## 2021-04-25 DIAGNOSIS — R52 Pain, unspecified: Secondary | ICD-10-CM | POA: Diagnosis not present

## 2021-04-26 DIAGNOSIS — I509 Heart failure, unspecified: Secondary | ICD-10-CM | POA: Diagnosis not present

## 2021-05-09 DIAGNOSIS — I824Y2 Acute embolism and thrombosis of unspecified deep veins of left proximal lower extremity: Secondary | ICD-10-CM | POA: Diagnosis not present

## 2021-05-09 DIAGNOSIS — L97919 Non-pressure chronic ulcer of unspecified part of right lower leg with unspecified severity: Secondary | ICD-10-CM | POA: Diagnosis not present

## 2021-05-09 DIAGNOSIS — Z299 Encounter for prophylactic measures, unspecified: Secondary | ICD-10-CM | POA: Diagnosis not present

## 2021-05-09 DIAGNOSIS — F321 Major depressive disorder, single episode, moderate: Secondary | ICD-10-CM | POA: Diagnosis not present

## 2021-05-19 DIAGNOSIS — Z6841 Body Mass Index (BMI) 40.0 and over, adult: Secondary | ICD-10-CM | POA: Diagnosis not present

## 2021-05-19 DIAGNOSIS — Z299 Encounter for prophylactic measures, unspecified: Secondary | ICD-10-CM | POA: Diagnosis not present

## 2021-05-19 DIAGNOSIS — F321 Major depressive disorder, single episode, moderate: Secondary | ICD-10-CM | POA: Diagnosis not present

## 2021-05-19 DIAGNOSIS — L03119 Cellulitis of unspecified part of limb: Secondary | ICD-10-CM | POA: Diagnosis not present

## 2021-05-19 DIAGNOSIS — I10 Essential (primary) hypertension: Secondary | ICD-10-CM | POA: Diagnosis not present

## 2021-05-19 DIAGNOSIS — J449 Chronic obstructive pulmonary disease, unspecified: Secondary | ICD-10-CM | POA: Diagnosis not present

## 2021-06-02 DIAGNOSIS — Z86718 Personal history of other venous thrombosis and embolism: Secondary | ICD-10-CM | POA: Diagnosis not present

## 2021-06-02 DIAGNOSIS — Z79899 Other long term (current) drug therapy: Secondary | ICD-10-CM | POA: Diagnosis not present

## 2021-06-02 DIAGNOSIS — J449 Chronic obstructive pulmonary disease, unspecified: Secondary | ICD-10-CM | POA: Diagnosis not present

## 2021-06-02 DIAGNOSIS — L03115 Cellulitis of right lower limb: Secondary | ICD-10-CM | POA: Diagnosis not present

## 2021-06-02 DIAGNOSIS — I10 Essential (primary) hypertension: Secondary | ICD-10-CM | POA: Diagnosis not present

## 2021-06-02 DIAGNOSIS — Z7901 Long term (current) use of anticoagulants: Secondary | ICD-10-CM | POA: Diagnosis not present

## 2021-06-02 DIAGNOSIS — L03116 Cellulitis of left lower limb: Secondary | ICD-10-CM | POA: Diagnosis not present

## 2021-06-02 DIAGNOSIS — I878 Other specified disorders of veins: Secondary | ICD-10-CM | POA: Diagnosis not present

## 2021-06-02 DIAGNOSIS — I890 Lymphedema, not elsewhere classified: Secondary | ICD-10-CM | POA: Diagnosis not present

## 2021-06-02 DIAGNOSIS — Z9981 Dependence on supplemental oxygen: Secondary | ICD-10-CM | POA: Diagnosis not present

## 2021-06-02 DIAGNOSIS — F32A Depression, unspecified: Secondary | ICD-10-CM | POA: Diagnosis not present

## 2021-06-02 DIAGNOSIS — I4891 Unspecified atrial fibrillation: Secondary | ICD-10-CM | POA: Diagnosis not present

## 2021-06-12 DIAGNOSIS — L309 Dermatitis, unspecified: Secondary | ICD-10-CM | POA: Diagnosis not present

## 2021-06-12 DIAGNOSIS — L280 Lichen simplex chronicus: Secondary | ICD-10-CM | POA: Diagnosis not present

## 2021-06-14 ENCOUNTER — Encounter: Payer: Medicare Other | Admitting: Vascular Surgery

## 2021-06-21 ENCOUNTER — Ambulatory Visit (INDEPENDENT_AMBULATORY_CARE_PROVIDER_SITE_OTHER): Payer: Medicare Other | Admitting: Vascular Surgery

## 2021-06-21 ENCOUNTER — Encounter: Payer: Self-pay | Admitting: Vascular Surgery

## 2021-06-21 ENCOUNTER — Other Ambulatory Visit: Payer: Self-pay

## 2021-06-21 VITALS — BP 109/70 | HR 75 | Temp 98.4°F | Resp 23 | Ht 64.0 in | Wt 312.1 lb

## 2021-06-21 DIAGNOSIS — R2243 Localized swelling, mass and lump, lower limb, bilateral: Secondary | ICD-10-CM

## 2021-06-21 DIAGNOSIS — I878 Other specified disorders of veins: Secondary | ICD-10-CM | POA: Diagnosis not present

## 2021-06-21 NOTE — Progress Notes (Signed)
[facility]ist of [place]  Patient name: Kaylee [name] MRN: [id number] DOB: [birth year] Sex: female  REASON FOR CONSULT: Evaluation arterial flow and swelling both lower extremities  HPI: Kaylee [name] is a 76 y.o. female, who is here today for evaluation.  She is here today with her husband.  She has a very complicated past history.  She is on chronic coagulation therapy due to atrial fibrillation.  She also has a history of extensive DVT in her left leg and initially had her anticoagulation due to the DVT.  She is on chronic Xarelto.  She had DVT in her left femoral vein extending through the popliteal vein in December 2021.  She recently underwent these with lower extremity venous study on 04/04/2021.  This showed residual chronic DVT in the same region on the left.  She has no history of DVT in the right leg and no imaging to suggest DVT in the right leg.  She has severe bilateral lower extremity swelling and has so for many years.  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  This was prior to her DVT.  She has had progression on both lower extremities and has some components consistent with lymphedema as well.  The swelling does extend onto the dorsum of her foot and she is having some skin changes around her ankles bilaterally to suggest this.  She has a hemosiderin deposits bilaterally and also thickening around her distal calves and ankles bilaterally.  She has a weeping from these wounds as well.  Her husband is present and reports that she had had good control with compression wraps in the past but this was discontinued after diagnosis of DVT.  Past Medical History:  Diagnosis Date   Arthritis    Asthma    Hypertension     Family History  Problem Relation Age of Onset   Hepatitis Mother        C   Hypertension Mother    Coronary artery disease Father     Obesity Father    Hypertension Father        hypoglycemic    SOCIAL HISTORY: Social History   Socioeconomic History   Marital status: Married    Spouse name: Not on file   Number of children: Not on file   Years of education: Not on file   Highest education level: Not on file  Occupational History   Occupation: self employed  Tobacco Use   Smoking status: Never   Smokeless tobacco: Not on file  Substance and Sexual Activity   Alcohol use: No   Drug use: No   Sexual activity: Yes    Birth control/protection: Post-menopausal  Other Topics Concern   Not on file  Social History Narrative   Not on file   Social Determinants of Health   Financial Resource Strain: Not on file  Food Insecurity: Not on file  Transportation Needs: Not on file  Physical Activity: Not on file  Stress: Not on file  Social Connections: Not on file  Intimate Partner Violence: Not on file    Allergies  Allergen Reactions   Tetanus Toxoid     REACTION: swelling    Current Outpatient Medications  Medication Sig Dispense Refill   acetaminophen (TYLENOL) 325 MG tablet Take 2 tablets (650 mg total) by mouth every 6 (six) hours as needed (or Fever >/= 101).     albuterol (PROVENTIL HFA;VENTOLIN  HFA) 108 (90 BASE) MCG/ACT inhaler Inhale 2 puffs into the lungs every 4 (four) hours as needed for wheezing or shortness of breath. Shortness of breath     CARVEDILOL PO Take by mouth.     dextromethorphan-guaiFENesin (MUCINEX DM) 30-600 MG per 12 hr tablet Take 1 tablet by mouth 2 (two) times daily. For cough     furosemide (LASIX) 40 MG tablet Take 40 mg by mouth as needed. Fluid retention     Ibuprofen 200 MG CAPS Take by mouth.     Mometasone Furo-Formoterol Fum (DULERA IN) Inhale 1 puff into the lungs 2 (two) times daily.      potassium chloride (KLOR-CON) 10 MEQ CR tablet Take 10 mEq by mouth as needed. Take with fluid pill     traMADol (ULTRAM) 50 MG tablet Take 1 tablet (50 mg total) by mouth every 6  (six) hours as needed. 90 tablet 5   No current facility-administered medications for this visit.    REVIEW OF SYSTEMS:  [X]  denotes positive finding, [ ]  denotes negative finding Cardiac  Comments:  Chest pain or chest pressure: x   Shortness of breath upon exertion: x   Short of breath when lying flat:    Irregular heart rhythm:        Vascular    Pain in calf, thigh, or hip brought on by ambulation:    Pain in feet at night that wakes you up from your sleep:     Blood clot in your veins: x   Leg swelling:  x       Pulmonary    Oxygen at home: x   Productive cough:     Wheezing:         Neurologic    Sudden weakness in arms or legs:     Sudden numbness in arms or legs:     Sudden onset of difficulty speaking or slurred speech:    Temporary loss of vision in one eye:     Problems with dizziness:         Gastrointestinal    Blood in stool:     Vomited blood:         Genitourinary    Burning when urinating:     Blood in urine:        Psychiatric    Major depression:         Hematologic    Bleeding problems:    Problems with blood clotting too easily:        Skin    Rashes or ulcers:        Constitutional    Fever or chills:      PHYSICAL EXAM: Vitals:   06/21/21 1410  BP: 109/70  Pulse: 75  Resp: (!) 23  Temp: 98.4 F (36.9 C)  SpO2: 98%  Weight: (!) 312 lb 2 oz (141.6 kg)  Height: 5\' 4"  (1.626 m)    GENERAL: The patient is a well-nourished female, in no acute distress. The vital signs are documented above.  She is in a wheelchair.  On oxygen. CARDIOVASCULAR: She does have marked pitting edema in both lower extremities.  I am able to palpate dorsalis pedis pulse bilaterally PULMONARY: There is good air exchange  MUSCULOSKELETAL: There are no major deformities or cyanosis. NEUROLOGIC: No focal weakness or paresthesias are detected. SKIN: Extensive acute skin changes bilaterally venous stasis disease with thickening and weeping from crusty areas  around her calf and ankle bilaterally no active venous ulcer  present PSYCHIATRIC: The patient has a normal affect.  DATA:  I reviewed her noninvasive studies from [facility].  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  I do not feel that she has any evidence of limb threatening ischemia with mild arterial insufficiency.  I am able to palpate dorsalis pedis pulses bilaterally.  She is unable to elevate her legs due to her obesity and pulmonary compromise.  She had a good result with compression wraps in the past and I feel that this would be important to resume this.  I explained that there is no contraindication from compression wraps with her chronic DVT.  She will see [place] again on an as-needed basis   Larina Earthly, MD [facility] of [facility] Tel [phone] Pager [phone]  Note: Portions of this report may have been transcribed using voice recognition software.  Every effort has been made to ensure accuracy; however, inadvertent computerized transcription errors may still be present.

## 2021-06-23 DIAGNOSIS — Z79899 Other long term (current) drug therapy: Secondary | ICD-10-CM | POA: Diagnosis not present

## 2021-06-23 DIAGNOSIS — Z7901 Long term (current) use of anticoagulants: Secondary | ICD-10-CM | POA: Diagnosis not present

## 2021-06-23 DIAGNOSIS — I890 Lymphedema, not elsewhere classified: Secondary | ICD-10-CM | POA: Diagnosis not present

## 2021-06-23 DIAGNOSIS — I10 Essential (primary) hypertension: Secondary | ICD-10-CM | POA: Diagnosis not present

## 2021-06-23 DIAGNOSIS — I2699 Other pulmonary embolism without acute cor pulmonale: Secondary | ICD-10-CM | POA: Diagnosis not present

## 2021-06-23 DIAGNOSIS — Z9981 Dependence on supplemental oxygen: Secondary | ICD-10-CM | POA: Diagnosis not present

## 2021-06-23 DIAGNOSIS — I878 Other specified disorders of veins: Secondary | ICD-10-CM | POA: Diagnosis not present

## 2021-06-23 DIAGNOSIS — Z86718 Personal history of other venous thrombosis and embolism: Secondary | ICD-10-CM | POA: Diagnosis not present

## 2021-06-23 DIAGNOSIS — I4891 Unspecified atrial fibrillation: Secondary | ICD-10-CM | POA: Diagnosis not present

## 2021-06-29 DIAGNOSIS — I872 Venous insufficiency (chronic) (peripheral): Secondary | ICD-10-CM | POA: Diagnosis not present

## 2021-06-29 DIAGNOSIS — I890 Lymphedema, not elsewhere classified: Secondary | ICD-10-CM | POA: Diagnosis not present

## 2021-06-29 DIAGNOSIS — I10 Essential (primary) hypertension: Secondary | ICD-10-CM | POA: Diagnosis not present

## 2021-06-29 DIAGNOSIS — S80812D Abrasion, left lower leg, subsequent encounter: Secondary | ICD-10-CM | POA: Diagnosis not present

## 2021-06-29 DIAGNOSIS — Z6841 Body Mass Index (BMI) 40.0 and over, adult: Secondary | ICD-10-CM | POA: Diagnosis not present

## 2021-06-29 DIAGNOSIS — I4891 Unspecified atrial fibrillation: Secondary | ICD-10-CM | POA: Diagnosis not present

## 2021-06-29 DIAGNOSIS — L03115 Cellulitis of right lower limb: Secondary | ICD-10-CM | POA: Diagnosis not present

## 2021-06-29 DIAGNOSIS — L03116 Cellulitis of left lower limb: Secondary | ICD-10-CM | POA: Diagnosis not present

## 2021-06-29 DIAGNOSIS — F32A Depression, unspecified: Secondary | ICD-10-CM | POA: Diagnosis not present

## 2021-06-29 DIAGNOSIS — I82432 Acute embolism and thrombosis of left popliteal vein: Secondary | ICD-10-CM | POA: Diagnosis not present

## 2021-06-29 DIAGNOSIS — Z9181 History of falling: Secondary | ICD-10-CM | POA: Diagnosis not present

## 2021-06-29 DIAGNOSIS — J449 Chronic obstructive pulmonary disease, unspecified: Secondary | ICD-10-CM | POA: Diagnosis not present

## 2021-06-29 DIAGNOSIS — Z7901 Long term (current) use of anticoagulants: Secondary | ICD-10-CM | POA: Diagnosis not present

## 2021-06-29 DIAGNOSIS — S80811D Abrasion, right lower leg, subsequent encounter: Secondary | ICD-10-CM | POA: Diagnosis not present

## 2021-06-29 DIAGNOSIS — I82412 Acute embolism and thrombosis of left femoral vein: Secondary | ICD-10-CM | POA: Diagnosis not present

## 2021-07-04 DIAGNOSIS — I872 Venous insufficiency (chronic) (peripheral): Secondary | ICD-10-CM | POA: Diagnosis not present

## 2021-07-04 DIAGNOSIS — S80811D Abrasion, right lower leg, subsequent encounter: Secondary | ICD-10-CM | POA: Diagnosis not present

## 2021-07-04 DIAGNOSIS — S80812D Abrasion, left lower leg, subsequent encounter: Secondary | ICD-10-CM | POA: Diagnosis not present

## 2021-07-04 DIAGNOSIS — I890 Lymphedema, not elsewhere classified: Secondary | ICD-10-CM | POA: Diagnosis not present

## 2021-07-04 DIAGNOSIS — L03115 Cellulitis of right lower limb: Secondary | ICD-10-CM | POA: Diagnosis not present

## 2021-07-04 DIAGNOSIS — L03116 Cellulitis of left lower limb: Secondary | ICD-10-CM | POA: Diagnosis not present

## 2021-07-12 DIAGNOSIS — S80812D Abrasion, left lower leg, subsequent encounter: Secondary | ICD-10-CM | POA: Diagnosis not present

## 2021-07-12 DIAGNOSIS — I872 Venous insufficiency (chronic) (peripheral): Secondary | ICD-10-CM | POA: Diagnosis not present

## 2021-07-12 DIAGNOSIS — L03115 Cellulitis of right lower limb: Secondary | ICD-10-CM | POA: Diagnosis not present

## 2021-07-12 DIAGNOSIS — I890 Lymphedema, not elsewhere classified: Secondary | ICD-10-CM | POA: Diagnosis not present

## 2021-07-12 DIAGNOSIS — L03116 Cellulitis of left lower limb: Secondary | ICD-10-CM | POA: Diagnosis not present

## 2021-07-12 DIAGNOSIS — S80811D Abrasion, right lower leg, subsequent encounter: Secondary | ICD-10-CM | POA: Diagnosis not present

## 2021-07-14 DIAGNOSIS — S80812D Abrasion, left lower leg, subsequent encounter: Secondary | ICD-10-CM | POA: Diagnosis not present

## 2021-07-14 DIAGNOSIS — I872 Venous insufficiency (chronic) (peripheral): Secondary | ICD-10-CM | POA: Diagnosis not present

## 2021-07-14 DIAGNOSIS — I890 Lymphedema, not elsewhere classified: Secondary | ICD-10-CM | POA: Diagnosis not present

## 2021-07-14 DIAGNOSIS — L03115 Cellulitis of right lower limb: Secondary | ICD-10-CM | POA: Diagnosis not present

## 2021-07-14 DIAGNOSIS — S80811D Abrasion, right lower leg, subsequent encounter: Secondary | ICD-10-CM | POA: Diagnosis not present

## 2021-07-14 DIAGNOSIS — L03116 Cellulitis of left lower limb: Secondary | ICD-10-CM | POA: Diagnosis not present

## 2021-07-18 DIAGNOSIS — L03116 Cellulitis of left lower limb: Secondary | ICD-10-CM | POA: Diagnosis not present

## 2021-07-18 DIAGNOSIS — S80811D Abrasion, right lower leg, subsequent encounter: Secondary | ICD-10-CM | POA: Diagnosis not present

## 2021-07-18 DIAGNOSIS — J449 Chronic obstructive pulmonary disease, unspecified: Secondary | ICD-10-CM | POA: Diagnosis not present

## 2021-07-18 DIAGNOSIS — I890 Lymphedema, not elsewhere classified: Secondary | ICD-10-CM | POA: Diagnosis not present

## 2021-07-18 DIAGNOSIS — J9611 Chronic respiratory failure with hypoxia: Secondary | ICD-10-CM | POA: Diagnosis not present

## 2021-07-18 DIAGNOSIS — I872 Venous insufficiency (chronic) (peripheral): Secondary | ICD-10-CM | POA: Diagnosis not present

## 2021-07-18 DIAGNOSIS — S80812D Abrasion, left lower leg, subsequent encounter: Secondary | ICD-10-CM | POA: Diagnosis not present

## 2021-07-18 DIAGNOSIS — Z299 Encounter for prophylactic measures, unspecified: Secondary | ICD-10-CM | POA: Diagnosis not present

## 2021-07-18 DIAGNOSIS — I10 Essential (primary) hypertension: Secondary | ICD-10-CM | POA: Diagnosis not present

## 2021-07-18 DIAGNOSIS — I4821 Permanent atrial fibrillation: Secondary | ICD-10-CM | POA: Diagnosis not present

## 2021-07-18 DIAGNOSIS — L03115 Cellulitis of right lower limb: Secondary | ICD-10-CM | POA: Diagnosis not present

## 2021-07-18 DIAGNOSIS — B3789 Other sites of candidiasis: Secondary | ICD-10-CM | POA: Diagnosis not present

## 2021-07-21 DIAGNOSIS — I890 Lymphedema, not elsewhere classified: Secondary | ICD-10-CM | POA: Diagnosis not present

## 2021-07-21 DIAGNOSIS — I2699 Other pulmonary embolism without acute cor pulmonale: Secondary | ICD-10-CM | POA: Diagnosis not present

## 2021-07-21 DIAGNOSIS — I4891 Unspecified atrial fibrillation: Secondary | ICD-10-CM | POA: Diagnosis not present

## 2021-07-21 DIAGNOSIS — I878 Other specified disorders of veins: Secondary | ICD-10-CM | POA: Diagnosis not present

## 2021-07-21 DIAGNOSIS — I10 Essential (primary) hypertension: Secondary | ICD-10-CM | POA: Diagnosis not present

## 2021-07-25 DIAGNOSIS — L03116 Cellulitis of left lower limb: Secondary | ICD-10-CM | POA: Diagnosis not present

## 2021-07-25 DIAGNOSIS — L03115 Cellulitis of right lower limb: Secondary | ICD-10-CM | POA: Diagnosis not present

## 2021-07-25 DIAGNOSIS — I872 Venous insufficiency (chronic) (peripheral): Secondary | ICD-10-CM | POA: Diagnosis not present

## 2021-07-25 DIAGNOSIS — S80811D Abrasion, right lower leg, subsequent encounter: Secondary | ICD-10-CM | POA: Diagnosis not present

## 2021-07-25 DIAGNOSIS — I890 Lymphedema, not elsewhere classified: Secondary | ICD-10-CM | POA: Diagnosis not present

## 2021-07-25 DIAGNOSIS — S80812D Abrasion, left lower leg, subsequent encounter: Secondary | ICD-10-CM | POA: Diagnosis not present

## 2021-07-28 DIAGNOSIS — I10 Essential (primary) hypertension: Secondary | ICD-10-CM | POA: Diagnosis not present

## 2021-07-28 DIAGNOSIS — S31819A Unspecified open wound of right buttock, initial encounter: Secondary | ICD-10-CM | POA: Diagnosis not present

## 2021-07-28 DIAGNOSIS — F32A Depression, unspecified: Secondary | ICD-10-CM | POA: Diagnosis not present

## 2021-07-28 DIAGNOSIS — M1990 Unspecified osteoarthritis, unspecified site: Secondary | ICD-10-CM | POA: Diagnosis not present

## 2021-07-28 DIAGNOSIS — I878 Other specified disorders of veins: Secondary | ICD-10-CM | POA: Diagnosis not present

## 2021-07-28 DIAGNOSIS — I890 Lymphedema, not elsewhere classified: Secondary | ICD-10-CM | POA: Diagnosis not present

## 2021-07-28 DIAGNOSIS — Z9981 Dependence on supplemental oxygen: Secondary | ICD-10-CM | POA: Diagnosis not present

## 2021-07-28 DIAGNOSIS — L03116 Cellulitis of left lower limb: Secondary | ICD-10-CM | POA: Diagnosis not present

## 2021-07-28 DIAGNOSIS — J45909 Unspecified asthma, uncomplicated: Secondary | ICD-10-CM | POA: Diagnosis not present

## 2021-07-28 DIAGNOSIS — L97929 Non-pressure chronic ulcer of unspecified part of left lower leg with unspecified severity: Secondary | ICD-10-CM | POA: Diagnosis not present

## 2021-07-28 DIAGNOSIS — L03115 Cellulitis of right lower limb: Secondary | ICD-10-CM | POA: Diagnosis not present

## 2021-07-28 DIAGNOSIS — I4891 Unspecified atrial fibrillation: Secondary | ICD-10-CM | POA: Diagnosis not present

## 2021-07-28 DIAGNOSIS — Z86718 Personal history of other venous thrombosis and embolism: Secondary | ICD-10-CM | POA: Diagnosis not present

## 2021-07-29 DIAGNOSIS — S80812D Abrasion, left lower leg, subsequent encounter: Secondary | ICD-10-CM | POA: Diagnosis not present

## 2021-07-29 DIAGNOSIS — I82432 Acute embolism and thrombosis of left popliteal vein: Secondary | ICD-10-CM | POA: Diagnosis not present

## 2021-07-29 DIAGNOSIS — S80811D Abrasion, right lower leg, subsequent encounter: Secondary | ICD-10-CM | POA: Diagnosis not present

## 2021-07-29 DIAGNOSIS — L03115 Cellulitis of right lower limb: Secondary | ICD-10-CM | POA: Diagnosis not present

## 2021-07-29 DIAGNOSIS — I4891 Unspecified atrial fibrillation: Secondary | ICD-10-CM | POA: Diagnosis not present

## 2021-07-29 DIAGNOSIS — L03116 Cellulitis of left lower limb: Secondary | ICD-10-CM | POA: Diagnosis not present

## 2021-07-29 DIAGNOSIS — Z6841 Body Mass Index (BMI) 40.0 and over, adult: Secondary | ICD-10-CM | POA: Diagnosis not present

## 2021-07-29 DIAGNOSIS — J449 Chronic obstructive pulmonary disease, unspecified: Secondary | ICD-10-CM | POA: Diagnosis not present

## 2021-07-29 DIAGNOSIS — Z9181 History of falling: Secondary | ICD-10-CM | POA: Diagnosis not present

## 2021-07-29 DIAGNOSIS — Z7901 Long term (current) use of anticoagulants: Secondary | ICD-10-CM | POA: Diagnosis not present

## 2021-07-29 DIAGNOSIS — I890 Lymphedema, not elsewhere classified: Secondary | ICD-10-CM | POA: Diagnosis not present

## 2021-07-29 DIAGNOSIS — I10 Essential (primary) hypertension: Secondary | ICD-10-CM | POA: Diagnosis not present

## 2021-07-29 DIAGNOSIS — F32A Depression, unspecified: Secondary | ICD-10-CM | POA: Diagnosis not present

## 2021-07-29 DIAGNOSIS — I82412 Acute embolism and thrombosis of left femoral vein: Secondary | ICD-10-CM | POA: Diagnosis not present

## 2021-07-29 DIAGNOSIS — I872 Venous insufficiency (chronic) (peripheral): Secondary | ICD-10-CM | POA: Diagnosis not present

## 2021-08-01 DIAGNOSIS — L03116 Cellulitis of left lower limb: Secondary | ICD-10-CM | POA: Diagnosis not present

## 2021-08-01 DIAGNOSIS — S80812D Abrasion, left lower leg, subsequent encounter: Secondary | ICD-10-CM | POA: Diagnosis not present

## 2021-08-01 DIAGNOSIS — S80811D Abrasion, right lower leg, subsequent encounter: Secondary | ICD-10-CM | POA: Diagnosis not present

## 2021-08-01 DIAGNOSIS — I872 Venous insufficiency (chronic) (peripheral): Secondary | ICD-10-CM | POA: Diagnosis not present

## 2021-08-01 DIAGNOSIS — L03115 Cellulitis of right lower limb: Secondary | ICD-10-CM | POA: Diagnosis not present

## 2021-08-01 DIAGNOSIS — I890 Lymphedema, not elsewhere classified: Secondary | ICD-10-CM | POA: Diagnosis not present

## 2021-08-08 DIAGNOSIS — I890 Lymphedema, not elsewhere classified: Secondary | ICD-10-CM | POA: Diagnosis not present

## 2021-08-08 DIAGNOSIS — L03115 Cellulitis of right lower limb: Secondary | ICD-10-CM | POA: Diagnosis not present

## 2021-08-08 DIAGNOSIS — S80812D Abrasion, left lower leg, subsequent encounter: Secondary | ICD-10-CM | POA: Diagnosis not present

## 2021-08-08 DIAGNOSIS — S80811D Abrasion, right lower leg, subsequent encounter: Secondary | ICD-10-CM | POA: Diagnosis not present

## 2021-08-08 DIAGNOSIS — L03116 Cellulitis of left lower limb: Secondary | ICD-10-CM | POA: Diagnosis not present

## 2021-08-08 DIAGNOSIS — I872 Venous insufficiency (chronic) (peripheral): Secondary | ICD-10-CM | POA: Diagnosis not present

## 2021-08-11 DIAGNOSIS — S80812D Abrasion, left lower leg, subsequent encounter: Secondary | ICD-10-CM | POA: Diagnosis not present

## 2021-08-11 DIAGNOSIS — S80811D Abrasion, right lower leg, subsequent encounter: Secondary | ICD-10-CM | POA: Diagnosis not present

## 2021-08-11 DIAGNOSIS — I872 Venous insufficiency (chronic) (peripheral): Secondary | ICD-10-CM | POA: Diagnosis not present

## 2021-08-11 DIAGNOSIS — L03116 Cellulitis of left lower limb: Secondary | ICD-10-CM | POA: Diagnosis not present

## 2021-08-11 DIAGNOSIS — L03115 Cellulitis of right lower limb: Secondary | ICD-10-CM | POA: Diagnosis not present

## 2021-08-11 DIAGNOSIS — I890 Lymphedema, not elsewhere classified: Secondary | ICD-10-CM | POA: Diagnosis not present

## 2021-08-14 DIAGNOSIS — S80812D Abrasion, left lower leg, subsequent encounter: Secondary | ICD-10-CM | POA: Diagnosis not present

## 2021-08-14 DIAGNOSIS — S80811D Abrasion, right lower leg, subsequent encounter: Secondary | ICD-10-CM | POA: Diagnosis not present

## 2021-08-14 DIAGNOSIS — L03115 Cellulitis of right lower limb: Secondary | ICD-10-CM | POA: Diagnosis not present

## 2021-08-14 DIAGNOSIS — I890 Lymphedema, not elsewhere classified: Secondary | ICD-10-CM | POA: Diagnosis not present

## 2021-08-14 DIAGNOSIS — L03116 Cellulitis of left lower limb: Secondary | ICD-10-CM | POA: Diagnosis not present

## 2021-08-14 DIAGNOSIS — I872 Venous insufficiency (chronic) (peripheral): Secondary | ICD-10-CM | POA: Diagnosis not present

## 2021-08-17 DIAGNOSIS — S80812D Abrasion, left lower leg, subsequent encounter: Secondary | ICD-10-CM | POA: Diagnosis not present

## 2021-08-17 DIAGNOSIS — I872 Venous insufficiency (chronic) (peripheral): Secondary | ICD-10-CM | POA: Diagnosis not present

## 2021-08-17 DIAGNOSIS — L03115 Cellulitis of right lower limb: Secondary | ICD-10-CM | POA: Diagnosis not present

## 2021-08-17 DIAGNOSIS — I890 Lymphedema, not elsewhere classified: Secondary | ICD-10-CM | POA: Diagnosis not present

## 2021-08-17 DIAGNOSIS — L03116 Cellulitis of left lower limb: Secondary | ICD-10-CM | POA: Diagnosis not present

## 2021-08-17 DIAGNOSIS — S80811D Abrasion, right lower leg, subsequent encounter: Secondary | ICD-10-CM | POA: Diagnosis not present

## 2021-08-22 DIAGNOSIS — I10 Essential (primary) hypertension: Secondary | ICD-10-CM | POA: Diagnosis not present

## 2021-08-22 DIAGNOSIS — I878 Other specified disorders of veins: Secondary | ICD-10-CM | POA: Diagnosis not present

## 2021-08-22 DIAGNOSIS — I890 Lymphedema, not elsewhere classified: Secondary | ICD-10-CM | POA: Diagnosis not present

## 2021-08-22 DIAGNOSIS — Z7901 Long term (current) use of anticoagulants: Secondary | ICD-10-CM | POA: Diagnosis not present

## 2021-08-22 DIAGNOSIS — Z86718 Personal history of other venous thrombosis and embolism: Secondary | ICD-10-CM | POA: Diagnosis not present

## 2021-08-23 DIAGNOSIS — I872 Venous insufficiency (chronic) (peripheral): Secondary | ICD-10-CM | POA: Diagnosis not present

## 2021-08-23 DIAGNOSIS — L03116 Cellulitis of left lower limb: Secondary | ICD-10-CM | POA: Diagnosis not present

## 2021-08-23 DIAGNOSIS — S80812D Abrasion, left lower leg, subsequent encounter: Secondary | ICD-10-CM | POA: Diagnosis not present

## 2021-08-23 DIAGNOSIS — S80811D Abrasion, right lower leg, subsequent encounter: Secondary | ICD-10-CM | POA: Diagnosis not present

## 2021-08-23 DIAGNOSIS — I890 Lymphedema, not elsewhere classified: Secondary | ICD-10-CM | POA: Diagnosis not present

## 2021-08-23 DIAGNOSIS — L03115 Cellulitis of right lower limb: Secondary | ICD-10-CM | POA: Diagnosis not present

## 2021-08-25 DIAGNOSIS — L03116 Cellulitis of left lower limb: Secondary | ICD-10-CM | POA: Diagnosis not present

## 2021-08-25 DIAGNOSIS — S80811D Abrasion, right lower leg, subsequent encounter: Secondary | ICD-10-CM | POA: Diagnosis not present

## 2021-08-25 DIAGNOSIS — I890 Lymphedema, not elsewhere classified: Secondary | ICD-10-CM | POA: Diagnosis not present

## 2021-08-25 DIAGNOSIS — I872 Venous insufficiency (chronic) (peripheral): Secondary | ICD-10-CM | POA: Diagnosis not present

## 2021-08-25 DIAGNOSIS — L03115 Cellulitis of right lower limb: Secondary | ICD-10-CM | POA: Diagnosis not present

## 2021-08-25 DIAGNOSIS — S80812D Abrasion, left lower leg, subsequent encounter: Secondary | ICD-10-CM | POA: Diagnosis not present

## 2021-08-30 DIAGNOSIS — I890 Lymphedema, not elsewhere classified: Secondary | ICD-10-CM | POA: Diagnosis not present

## 2021-08-30 DIAGNOSIS — Z789 Other specified health status: Secondary | ICD-10-CM | POA: Diagnosis not present

## 2021-08-30 DIAGNOSIS — R29898 Other symptoms and signs involving the musculoskeletal system: Secondary | ICD-10-CM | POA: Diagnosis not present

## 2021-09-06 DIAGNOSIS — I890 Lymphedema, not elsewhere classified: Secondary | ICD-10-CM | POA: Diagnosis not present

## 2021-09-06 DIAGNOSIS — Z789 Other specified health status: Secondary | ICD-10-CM | POA: Diagnosis not present

## 2021-09-06 DIAGNOSIS — R29898 Other symptoms and signs involving the musculoskeletal system: Secondary | ICD-10-CM | POA: Diagnosis not present

## 2021-09-07 DIAGNOSIS — Z789 Other specified health status: Secondary | ICD-10-CM | POA: Diagnosis not present

## 2021-09-07 DIAGNOSIS — R29898 Other symptoms and signs involving the musculoskeletal system: Secondary | ICD-10-CM | POA: Diagnosis not present

## 2021-09-07 DIAGNOSIS — I890 Lymphedema, not elsewhere classified: Secondary | ICD-10-CM | POA: Diagnosis not present

## 2021-09-13 DIAGNOSIS — Z789 Other specified health status: Secondary | ICD-10-CM | POA: Diagnosis not present

## 2021-09-13 DIAGNOSIS — R29898 Other symptoms and signs involving the musculoskeletal system: Secondary | ICD-10-CM | POA: Diagnosis not present

## 2021-09-13 DIAGNOSIS — I890 Lymphedema, not elsewhere classified: Secondary | ICD-10-CM | POA: Diagnosis not present

## 2021-09-14 DIAGNOSIS — Z789 Other specified health status: Secondary | ICD-10-CM | POA: Diagnosis not present

## 2021-09-14 DIAGNOSIS — R29898 Other symptoms and signs involving the musculoskeletal system: Secondary | ICD-10-CM | POA: Diagnosis not present

## 2021-09-14 DIAGNOSIS — I890 Lymphedema, not elsewhere classified: Secondary | ICD-10-CM | POA: Diagnosis not present

## 2021-09-18 DIAGNOSIS — Z789 Other specified health status: Secondary | ICD-10-CM | POA: Diagnosis not present

## 2021-09-18 DIAGNOSIS — R29898 Other symptoms and signs involving the musculoskeletal system: Secondary | ICD-10-CM | POA: Diagnosis not present

## 2021-09-18 DIAGNOSIS — I890 Lymphedema, not elsewhere classified: Secondary | ICD-10-CM | POA: Diagnosis not present

## 2021-09-20 DIAGNOSIS — Z789 Other specified health status: Secondary | ICD-10-CM | POA: Diagnosis not present

## 2021-09-20 DIAGNOSIS — I890 Lymphedema, not elsewhere classified: Secondary | ICD-10-CM | POA: Diagnosis not present

## 2021-09-20 DIAGNOSIS — R29898 Other symptoms and signs involving the musculoskeletal system: Secondary | ICD-10-CM | POA: Diagnosis not present

## 2021-09-25 DIAGNOSIS — Z789 Other specified health status: Secondary | ICD-10-CM | POA: Diagnosis not present

## 2021-09-25 DIAGNOSIS — I890 Lymphedema, not elsewhere classified: Secondary | ICD-10-CM | POA: Diagnosis not present

## 2021-09-25 DIAGNOSIS — R29898 Other symptoms and signs involving the musculoskeletal system: Secondary | ICD-10-CM | POA: Diagnosis not present

## 2021-09-26 DIAGNOSIS — M1990 Unspecified osteoarthritis, unspecified site: Secondary | ICD-10-CM | POA: Diagnosis not present

## 2021-09-26 DIAGNOSIS — E538 Deficiency of other specified B group vitamins: Secondary | ICD-10-CM | POA: Diagnosis not present

## 2021-09-26 DIAGNOSIS — Z299 Encounter for prophylactic measures, unspecified: Secondary | ICD-10-CM | POA: Diagnosis not present

## 2021-09-26 DIAGNOSIS — E039 Hypothyroidism, unspecified: Secondary | ICD-10-CM | POA: Diagnosis not present

## 2021-09-26 DIAGNOSIS — R5383 Other fatigue: Secondary | ICD-10-CM | POA: Diagnosis not present

## 2021-09-26 DIAGNOSIS — I10 Essential (primary) hypertension: Secondary | ICD-10-CM | POA: Diagnosis not present

## 2021-09-27 DIAGNOSIS — I890 Lymphedema, not elsewhere classified: Secondary | ICD-10-CM | POA: Diagnosis not present

## 2021-09-27 DIAGNOSIS — R29898 Other symptoms and signs involving the musculoskeletal system: Secondary | ICD-10-CM | POA: Diagnosis not present

## 2021-09-27 DIAGNOSIS — Z789 Other specified health status: Secondary | ICD-10-CM | POA: Diagnosis not present

## 2021-10-04 DIAGNOSIS — I890 Lymphedema, not elsewhere classified: Secondary | ICD-10-CM | POA: Diagnosis not present

## 2021-10-04 DIAGNOSIS — Z789 Other specified health status: Secondary | ICD-10-CM | POA: Diagnosis not present

## 2021-10-04 DIAGNOSIS — R29898 Other symptoms and signs involving the musculoskeletal system: Secondary | ICD-10-CM | POA: Diagnosis not present

## 2021-10-16 DIAGNOSIS — Z789 Other specified health status: Secondary | ICD-10-CM | POA: Diagnosis not present

## 2021-10-16 DIAGNOSIS — I890 Lymphedema, not elsewhere classified: Secondary | ICD-10-CM | POA: Diagnosis not present

## 2021-10-16 DIAGNOSIS — R29898 Other symptoms and signs involving the musculoskeletal system: Secondary | ICD-10-CM | POA: Diagnosis not present

## 2021-10-18 DIAGNOSIS — R29898 Other symptoms and signs involving the musculoskeletal system: Secondary | ICD-10-CM | POA: Diagnosis not present

## 2021-10-18 DIAGNOSIS — Z789 Other specified health status: Secondary | ICD-10-CM | POA: Diagnosis not present

## 2021-10-18 DIAGNOSIS — I890 Lymphedema, not elsewhere classified: Secondary | ICD-10-CM | POA: Diagnosis not present

## 2021-10-20 DIAGNOSIS — Z86718 Personal history of other venous thrombosis and embolism: Secondary | ICD-10-CM | POA: Diagnosis not present

## 2021-10-20 DIAGNOSIS — I890 Lymphedema, not elsewhere classified: Secondary | ICD-10-CM | POA: Diagnosis not present

## 2021-10-20 DIAGNOSIS — I878 Other specified disorders of veins: Secondary | ICD-10-CM | POA: Diagnosis not present

## 2021-10-20 DIAGNOSIS — Z7901 Long term (current) use of anticoagulants: Secondary | ICD-10-CM | POA: Diagnosis not present

## 2021-10-20 DIAGNOSIS — R600 Localized edema: Secondary | ICD-10-CM | POA: Diagnosis not present

## 2021-10-20 DIAGNOSIS — L0390 Cellulitis, unspecified: Secondary | ICD-10-CM | POA: Diagnosis not present

## 2021-10-20 DIAGNOSIS — I10 Essential (primary) hypertension: Secondary | ICD-10-CM | POA: Diagnosis not present

## 2021-10-25 DIAGNOSIS — R29898 Other symptoms and signs involving the musculoskeletal system: Secondary | ICD-10-CM | POA: Diagnosis not present

## 2021-10-25 DIAGNOSIS — I890 Lymphedema, not elsewhere classified: Secondary | ICD-10-CM | POA: Diagnosis not present

## 2021-10-25 DIAGNOSIS — Z789 Other specified health status: Secondary | ICD-10-CM | POA: Diagnosis not present

## 2021-10-30 DIAGNOSIS — R29898 Other symptoms and signs involving the musculoskeletal system: Secondary | ICD-10-CM | POA: Diagnosis not present

## 2021-10-30 DIAGNOSIS — Z789 Other specified health status: Secondary | ICD-10-CM | POA: Diagnosis not present

## 2021-10-30 DIAGNOSIS — I890 Lymphedema, not elsewhere classified: Secondary | ICD-10-CM | POA: Diagnosis not present

## 2021-11-01 DIAGNOSIS — R29898 Other symptoms and signs involving the musculoskeletal system: Secondary | ICD-10-CM | POA: Diagnosis not present

## 2021-11-01 DIAGNOSIS — I890 Lymphedema, not elsewhere classified: Secondary | ICD-10-CM | POA: Diagnosis not present

## 2021-11-01 DIAGNOSIS — Z789 Other specified health status: Secondary | ICD-10-CM | POA: Diagnosis not present

## 2021-11-04 DIAGNOSIS — Z79899 Other long term (current) drug therapy: Secondary | ICD-10-CM | POA: Diagnosis not present

## 2021-11-04 DIAGNOSIS — J705 Respiratory conditions due to smoke inhalation: Secondary | ICD-10-CM | POA: Diagnosis not present

## 2021-11-04 DIAGNOSIS — Z9981 Dependence on supplemental oxygen: Secondary | ICD-10-CM | POA: Diagnosis not present

## 2021-11-04 DIAGNOSIS — I10 Essential (primary) hypertension: Secondary | ICD-10-CM | POA: Diagnosis not present

## 2021-11-04 DIAGNOSIS — W268XXA Contact with other sharp object(s), not elsewhere classified, initial encounter: Secondary | ICD-10-CM | POA: Diagnosis not present

## 2021-11-04 DIAGNOSIS — R0602 Shortness of breath: Secondary | ICD-10-CM | POA: Diagnosis not present

## 2021-11-04 DIAGNOSIS — Z7951 Long term (current) use of inhaled steroids: Secondary | ICD-10-CM | POA: Diagnosis not present

## 2021-11-04 DIAGNOSIS — Z7901 Long term (current) use of anticoagulants: Secondary | ICD-10-CM | POA: Diagnosis not present

## 2021-11-04 DIAGNOSIS — T59811A Toxic effect of smoke, accidental (unintentional), initial encounter: Secondary | ICD-10-CM | POA: Diagnosis not present

## 2021-11-04 DIAGNOSIS — S0990XA Unspecified injury of head, initial encounter: Secondary | ICD-10-CM | POA: Diagnosis not present

## 2021-11-04 DIAGNOSIS — F32A Depression, unspecified: Secondary | ICD-10-CM | POA: Diagnosis not present

## 2021-11-04 DIAGNOSIS — I959 Hypotension, unspecified: Secondary | ICD-10-CM | POA: Diagnosis not present

## 2021-11-04 DIAGNOSIS — R58 Hemorrhage, not elsewhere classified: Secondary | ICD-10-CM | POA: Diagnosis not present

## 2021-11-04 DIAGNOSIS — J45909 Unspecified asthma, uncomplicated: Secondary | ICD-10-CM | POA: Diagnosis not present

## 2021-11-04 DIAGNOSIS — Z86718 Personal history of other venous thrombosis and embolism: Secondary | ICD-10-CM | POA: Diagnosis not present

## 2021-11-04 DIAGNOSIS — S0101XA Laceration without foreign body of scalp, initial encounter: Secondary | ICD-10-CM | POA: Diagnosis not present

## 2021-11-04 DIAGNOSIS — I4891 Unspecified atrial fibrillation: Secondary | ICD-10-CM | POA: Diagnosis not present

## 2021-11-06 DIAGNOSIS — I890 Lymphedema, not elsewhere classified: Secondary | ICD-10-CM | POA: Diagnosis not present

## 2021-11-06 DIAGNOSIS — Z789 Other specified health status: Secondary | ICD-10-CM | POA: Diagnosis not present

## 2021-11-06 DIAGNOSIS — R29898 Other symptoms and signs involving the musculoskeletal system: Secondary | ICD-10-CM | POA: Diagnosis not present

## 2021-11-08 DIAGNOSIS — Z789 Other specified health status: Secondary | ICD-10-CM | POA: Diagnosis not present

## 2021-11-08 DIAGNOSIS — I890 Lymphedema, not elsewhere classified: Secondary | ICD-10-CM | POA: Diagnosis not present

## 2021-11-08 DIAGNOSIS — R29898 Other symptoms and signs involving the musculoskeletal system: Secondary | ICD-10-CM | POA: Diagnosis not present

## 2021-11-14 DIAGNOSIS — Z789 Other specified health status: Secondary | ICD-10-CM | POA: Diagnosis not present

## 2021-11-14 DIAGNOSIS — R29898 Other symptoms and signs involving the musculoskeletal system: Secondary | ICD-10-CM | POA: Diagnosis not present

## 2021-11-14 DIAGNOSIS — I890 Lymphedema, not elsewhere classified: Secondary | ICD-10-CM | POA: Diagnosis not present

## 2021-11-16 DIAGNOSIS — Z789 Other specified health status: Secondary | ICD-10-CM | POA: Diagnosis not present

## 2021-11-16 DIAGNOSIS — R29898 Other symptoms and signs involving the musculoskeletal system: Secondary | ICD-10-CM | POA: Diagnosis not present

## 2021-11-16 DIAGNOSIS — I890 Lymphedema, not elsewhere classified: Secondary | ICD-10-CM | POA: Diagnosis not present

## 2021-11-21 DIAGNOSIS — I890 Lymphedema, not elsewhere classified: Secondary | ICD-10-CM | POA: Diagnosis not present

## 2021-11-21 DIAGNOSIS — R29898 Other symptoms and signs involving the musculoskeletal system: Secondary | ICD-10-CM | POA: Diagnosis not present

## 2021-11-21 DIAGNOSIS — Z789 Other specified health status: Secondary | ICD-10-CM | POA: Diagnosis not present

## 2021-11-23 DIAGNOSIS — R29898 Other symptoms and signs involving the musculoskeletal system: Secondary | ICD-10-CM | POA: Diagnosis not present

## 2021-11-23 DIAGNOSIS — I890 Lymphedema, not elsewhere classified: Secondary | ICD-10-CM | POA: Diagnosis not present

## 2021-11-23 DIAGNOSIS — Z789 Other specified health status: Secondary | ICD-10-CM | POA: Diagnosis not present

## 2021-11-30 DIAGNOSIS — L03115 Cellulitis of right lower limb: Secondary | ICD-10-CM | POA: Diagnosis not present

## 2021-11-30 DIAGNOSIS — J45909 Unspecified asthma, uncomplicated: Secondary | ICD-10-CM | POA: Diagnosis not present

## 2021-11-30 DIAGNOSIS — X58XXXA Exposure to other specified factors, initial encounter: Secondary | ICD-10-CM | POA: Diagnosis not present

## 2021-11-30 DIAGNOSIS — S71101A Unspecified open wound, right thigh, initial encounter: Secondary | ICD-10-CM | POA: Diagnosis not present

## 2021-11-30 DIAGNOSIS — I4891 Unspecified atrial fibrillation: Secondary | ICD-10-CM | POA: Diagnosis not present

## 2021-11-30 DIAGNOSIS — I10 Essential (primary) hypertension: Secondary | ICD-10-CM | POA: Diagnosis not present

## 2021-12-06 DIAGNOSIS — Z299 Encounter for prophylactic measures, unspecified: Secondary | ICD-10-CM | POA: Diagnosis not present

## 2021-12-06 DIAGNOSIS — F321 Major depressive disorder, single episode, moderate: Secondary | ICD-10-CM | POA: Diagnosis not present

## 2021-12-06 DIAGNOSIS — I10 Essential (primary) hypertension: Secondary | ICD-10-CM | POA: Diagnosis not present

## 2021-12-06 DIAGNOSIS — L98499 Non-pressure chronic ulcer of skin of other sites with unspecified severity: Secondary | ICD-10-CM | POA: Diagnosis not present

## 2021-12-06 DIAGNOSIS — M179 Osteoarthritis of knee, unspecified: Secondary | ICD-10-CM | POA: Diagnosis not present

## 2021-12-14 DIAGNOSIS — Z789 Other specified health status: Secondary | ICD-10-CM | POA: Diagnosis not present

## 2021-12-14 DIAGNOSIS — I890 Lymphedema, not elsewhere classified: Secondary | ICD-10-CM | POA: Diagnosis not present

## 2021-12-14 DIAGNOSIS — R29898 Other symptoms and signs involving the musculoskeletal system: Secondary | ICD-10-CM | POA: Diagnosis not present

## 2021-12-22 DIAGNOSIS — I890 Lymphedema, not elsewhere classified: Secondary | ICD-10-CM | POA: Diagnosis not present

## 2021-12-22 DIAGNOSIS — I2699 Other pulmonary embolism without acute cor pulmonale: Secondary | ICD-10-CM | POA: Diagnosis not present

## 2021-12-22 DIAGNOSIS — I878 Other specified disorders of veins: Secondary | ICD-10-CM | POA: Diagnosis not present

## 2021-12-22 DIAGNOSIS — Z9981 Dependence on supplemental oxygen: Secondary | ICD-10-CM | POA: Diagnosis not present

## 2021-12-22 DIAGNOSIS — I10 Essential (primary) hypertension: Secondary | ICD-10-CM | POA: Diagnosis not present

## 2022-01-04 DIAGNOSIS — Z1331 Encounter for screening for depression: Secondary | ICD-10-CM | POA: Diagnosis not present

## 2022-01-04 DIAGNOSIS — R5383 Other fatigue: Secondary | ICD-10-CM | POA: Diagnosis not present

## 2022-01-04 DIAGNOSIS — I10 Essential (primary) hypertension: Secondary | ICD-10-CM | POA: Diagnosis not present

## 2022-01-04 DIAGNOSIS — Z789 Other specified health status: Secondary | ICD-10-CM | POA: Diagnosis not present

## 2022-01-04 DIAGNOSIS — Z1339 Encounter for screening examination for other mental health and behavioral disorders: Secondary | ICD-10-CM | POA: Diagnosis not present

## 2022-01-04 DIAGNOSIS — E7800 Pure hypercholesterolemia, unspecified: Secondary | ICD-10-CM | POA: Diagnosis not present

## 2022-01-04 DIAGNOSIS — Z0000 Encounter for general adult medical examination without abnormal findings: Secondary | ICD-10-CM | POA: Diagnosis not present

## 2022-01-04 DIAGNOSIS — Z7189 Other specified counseling: Secondary | ICD-10-CM | POA: Diagnosis not present

## 2022-01-04 DIAGNOSIS — Z299 Encounter for prophylactic measures, unspecified: Secondary | ICD-10-CM | POA: Diagnosis not present

## 2022-01-04 DIAGNOSIS — Z6841 Body Mass Index (BMI) 40.0 and over, adult: Secondary | ICD-10-CM | POA: Diagnosis not present

## 2022-01-04 DIAGNOSIS — Z79899 Other long term (current) drug therapy: Secondary | ICD-10-CM | POA: Diagnosis not present

## 2022-01-22 DIAGNOSIS — I10 Essential (primary) hypertension: Secondary | ICD-10-CM | POA: Diagnosis not present

## 2022-01-22 DIAGNOSIS — I4821 Permanent atrial fibrillation: Secondary | ICD-10-CM | POA: Diagnosis not present

## 2022-05-06 DIAGNOSIS — I10 Essential (primary) hypertension: Secondary | ICD-10-CM | POA: Diagnosis not present

## 2022-05-06 DIAGNOSIS — M1990 Unspecified osteoarthritis, unspecified site: Secondary | ICD-10-CM | POA: Diagnosis not present

## 2022-07-23 DIAGNOSIS — I4821 Permanent atrial fibrillation: Secondary | ICD-10-CM | POA: Diagnosis not present

## 2022-07-23 DIAGNOSIS — S71101A Unspecified open wound, right thigh, initial encounter: Secondary | ICD-10-CM | POA: Diagnosis not present

## 2022-07-23 DIAGNOSIS — I10 Essential (primary) hypertension: Secondary | ICD-10-CM | POA: Diagnosis not present

## 2022-08-07 DIAGNOSIS — H25813 Combined forms of age-related cataract, bilateral: Secondary | ICD-10-CM | POA: Diagnosis not present

## 2022-08-07 DIAGNOSIS — H43813 Vitreous degeneration, bilateral: Secondary | ICD-10-CM | POA: Diagnosis not present

## 2022-08-07 DIAGNOSIS — H5213 Myopia, bilateral: Secondary | ICD-10-CM | POA: Diagnosis not present

## 2022-08-07 DIAGNOSIS — H524 Presbyopia: Secondary | ICD-10-CM | POA: Diagnosis not present

## 2022-08-07 DIAGNOSIS — H52223 Regular astigmatism, bilateral: Secondary | ICD-10-CM | POA: Diagnosis not present

## 2022-10-08 DIAGNOSIS — F321 Major depressive disorder, single episode, moderate: Secondary | ICD-10-CM | POA: Diagnosis not present

## 2022-10-08 DIAGNOSIS — J440 Chronic obstructive pulmonary disease with acute lower respiratory infection: Secondary | ICD-10-CM | POA: Diagnosis not present

## 2022-10-08 DIAGNOSIS — I10 Essential (primary) hypertension: Secondary | ICD-10-CM | POA: Diagnosis not present

## 2022-10-08 DIAGNOSIS — J9611 Chronic respiratory failure with hypoxia: Secondary | ICD-10-CM | POA: Diagnosis not present

## 2022-10-08 DIAGNOSIS — Z79899 Other long term (current) drug therapy: Secondary | ICD-10-CM | POA: Diagnosis not present

## 2022-10-08 DIAGNOSIS — Z299 Encounter for prophylactic measures, unspecified: Secondary | ICD-10-CM | POA: Diagnosis not present

## 2023-01-10 DIAGNOSIS — R5383 Other fatigue: Secondary | ICD-10-CM | POA: Diagnosis not present

## 2023-01-10 DIAGNOSIS — E039 Hypothyroidism, unspecified: Secondary | ICD-10-CM | POA: Diagnosis not present

## 2023-01-10 DIAGNOSIS — E7800 Pure hypercholesterolemia, unspecified: Secondary | ICD-10-CM | POA: Diagnosis not present

## 2023-01-10 DIAGNOSIS — Z6841 Body Mass Index (BMI) 40.0 and over, adult: Secondary | ICD-10-CM | POA: Diagnosis not present

## 2023-01-10 DIAGNOSIS — Z299 Encounter for prophylactic measures, unspecified: Secondary | ICD-10-CM | POA: Diagnosis not present

## 2023-01-10 DIAGNOSIS — Z1331 Encounter for screening for depression: Secondary | ICD-10-CM | POA: Diagnosis not present

## 2023-01-10 DIAGNOSIS — I10 Essential (primary) hypertension: Secondary | ICD-10-CM | POA: Diagnosis not present

## 2023-01-10 DIAGNOSIS — Z0000 Encounter for general adult medical examination without abnormal findings: Secondary | ICD-10-CM | POA: Diagnosis not present

## 2023-01-10 DIAGNOSIS — Z7189 Other specified counseling: Secondary | ICD-10-CM | POA: Diagnosis not present

## 2023-01-10 DIAGNOSIS — Z1339 Encounter for screening examination for other mental health and behavioral disorders: Secondary | ICD-10-CM | POA: Diagnosis not present

## 2023-01-10 DIAGNOSIS — Z79899 Other long term (current) drug therapy: Secondary | ICD-10-CM | POA: Diagnosis not present

## 2023-01-29 DIAGNOSIS — J984 Other disorders of lung: Secondary | ICD-10-CM | POA: Diagnosis not present

## 2023-01-29 DIAGNOSIS — R600 Localized edema: Secondary | ICD-10-CM | POA: Diagnosis not present

## 2023-01-29 DIAGNOSIS — I10 Essential (primary) hypertension: Secondary | ICD-10-CM | POA: Diagnosis not present

## 2023-01-29 DIAGNOSIS — Z86718 Personal history of other venous thrombosis and embolism: Secondary | ICD-10-CM | POA: Diagnosis not present

## 2023-01-29 DIAGNOSIS — I4821 Permanent atrial fibrillation: Secondary | ICD-10-CM | POA: Diagnosis not present

## 2023-02-07 DIAGNOSIS — N179 Acute kidney failure, unspecified: Secondary | ICD-10-CM | POA: Diagnosis not present

## 2023-02-07 DIAGNOSIS — Z79890 Hormone replacement therapy (postmenopausal): Secondary | ICD-10-CM | POA: Diagnosis not present

## 2023-02-07 DIAGNOSIS — E669 Obesity, unspecified: Secondary | ICD-10-CM | POA: Diagnosis not present

## 2023-02-07 DIAGNOSIS — L539 Erythematous condition, unspecified: Secondary | ICD-10-CM | POA: Diagnosis present

## 2023-02-07 DIAGNOSIS — L89322 Pressure ulcer of left buttock, stage 2: Secondary | ICD-10-CM | POA: Diagnosis not present

## 2023-02-07 DIAGNOSIS — B372 Candidiasis of skin and nail: Secondary | ICD-10-CM | POA: Diagnosis present

## 2023-02-07 DIAGNOSIS — Z79899 Other long term (current) drug therapy: Secondary | ICD-10-CM | POA: Diagnosis not present

## 2023-02-07 DIAGNOSIS — I10 Essential (primary) hypertension: Secondary | ICD-10-CM | POA: Diagnosis not present

## 2023-02-07 DIAGNOSIS — L24A0 Irritant contact dermatitis due to friction or contact with body fluids, unspecified: Secondary | ICD-10-CM | POA: Diagnosis present

## 2023-02-07 DIAGNOSIS — L03312 Cellulitis of back [any part except buttock]: Secondary | ICD-10-CM | POA: Diagnosis not present

## 2023-02-07 DIAGNOSIS — I959 Hypotension, unspecified: Secondary | ICD-10-CM | POA: Diagnosis not present

## 2023-02-07 DIAGNOSIS — Z992 Dependence on renal dialysis: Secondary | ICD-10-CM | POA: Diagnosis not present

## 2023-02-07 DIAGNOSIS — F32A Depression, unspecified: Secondary | ICD-10-CM | POA: Diagnosis present

## 2023-02-07 DIAGNOSIS — Z792 Long term (current) use of antibiotics: Secondary | ICD-10-CM | POA: Diagnosis not present

## 2023-02-07 DIAGNOSIS — R062 Wheezing: Secondary | ICD-10-CM | POA: Diagnosis not present

## 2023-02-07 DIAGNOSIS — J9611 Chronic respiratory failure with hypoxia: Secondary | ICD-10-CM | POA: Diagnosis not present

## 2023-02-07 DIAGNOSIS — J9610 Chronic respiratory failure, unspecified whether with hypoxia or hypercapnia: Secondary | ICD-10-CM | POA: Diagnosis not present

## 2023-02-07 DIAGNOSIS — L03317 Cellulitis of buttock: Secondary | ICD-10-CM | POA: Diagnosis not present

## 2023-02-07 DIAGNOSIS — A419 Sepsis, unspecified organism: Secondary | ICD-10-CM | POA: Diagnosis not present

## 2023-02-07 DIAGNOSIS — E876 Hypokalemia: Secondary | ICD-10-CM | POA: Diagnosis not present

## 2023-02-07 DIAGNOSIS — R7303 Prediabetes: Secondary | ICD-10-CM | POA: Diagnosis not present

## 2023-02-07 DIAGNOSIS — R531 Weakness: Secondary | ICD-10-CM | POA: Diagnosis not present

## 2023-02-07 DIAGNOSIS — I890 Lymphedema, not elsewhere classified: Secondary | ICD-10-CM | POA: Diagnosis not present

## 2023-02-07 DIAGNOSIS — E039 Hypothyroidism, unspecified: Secondary | ICD-10-CM | POA: Diagnosis present

## 2023-02-07 DIAGNOSIS — Z9981 Dependence on supplemental oxygen: Secondary | ICD-10-CM | POA: Diagnosis not present

## 2023-02-07 DIAGNOSIS — L03115 Cellulitis of right lower limb: Secondary | ICD-10-CM | POA: Diagnosis not present

## 2023-02-07 DIAGNOSIS — M169 Osteoarthritis of hip, unspecified: Secondary | ICD-10-CM | POA: Diagnosis present

## 2023-02-07 DIAGNOSIS — J45902 Unspecified asthma with status asthmaticus: Secondary | ICD-10-CM | POA: Diagnosis not present

## 2023-02-07 DIAGNOSIS — J45998 Other asthma: Secondary | ICD-10-CM | POA: Diagnosis present

## 2023-02-07 DIAGNOSIS — L89159 Pressure ulcer of sacral region, unspecified stage: Secondary | ICD-10-CM | POA: Diagnosis not present

## 2023-02-07 DIAGNOSIS — I4891 Unspecified atrial fibrillation: Secondary | ICD-10-CM | POA: Diagnosis present

## 2023-02-07 DIAGNOSIS — M167 Other unilateral secondary osteoarthritis of hip: Secondary | ICD-10-CM | POA: Diagnosis not present

## 2023-02-07 DIAGNOSIS — L89152 Pressure ulcer of sacral region, stage 2: Secondary | ICD-10-CM | POA: Diagnosis not present

## 2023-02-07 DIAGNOSIS — K5730 Diverticulosis of large intestine without perforation or abscess without bleeding: Secondary | ICD-10-CM | POA: Diagnosis not present

## 2023-02-07 DIAGNOSIS — Z6841 Body Mass Index (BMI) 40.0 and over, adult: Secondary | ICD-10-CM | POA: Diagnosis not present

## 2023-02-07 DIAGNOSIS — N281 Cyst of kidney, acquired: Secondary | ICD-10-CM | POA: Diagnosis not present

## 2023-02-07 DIAGNOSIS — R739 Hyperglycemia, unspecified: Secondary | ICD-10-CM | POA: Diagnosis not present

## 2023-02-07 DIAGNOSIS — E8770 Fluid overload, unspecified: Secondary | ICD-10-CM | POA: Diagnosis not present

## 2023-02-07 DIAGNOSIS — M6281 Muscle weakness (generalized): Secondary | ICD-10-CM | POA: Diagnosis present

## 2023-02-07 DIAGNOSIS — R54 Age-related physical debility: Secondary | ICD-10-CM | POA: Diagnosis not present

## 2023-02-07 DIAGNOSIS — R6520 Severe sepsis without septic shock: Secondary | ICD-10-CM | POA: Diagnosis not present

## 2023-02-07 DIAGNOSIS — L89312 Pressure ulcer of right buttock, stage 2: Secondary | ICD-10-CM | POA: Diagnosis not present

## 2023-02-07 DIAGNOSIS — Z1152 Encounter for screening for COVID-19: Secondary | ICD-10-CM | POA: Diagnosis not present

## 2023-02-07 DIAGNOSIS — Z7401 Bed confinement status: Secondary | ICD-10-CM | POA: Diagnosis not present

## 2023-02-07 DIAGNOSIS — I4821 Permanent atrial fibrillation: Secondary | ICD-10-CM | POA: Diagnosis not present

## 2023-02-07 DIAGNOSIS — R600 Localized edema: Secondary | ICD-10-CM | POA: Diagnosis not present

## 2023-02-07 DIAGNOSIS — B958 Unspecified staphylococcus as the cause of diseases classified elsewhere: Secondary | ICD-10-CM | POA: Diagnosis not present

## 2023-02-07 DIAGNOSIS — R001 Bradycardia, unspecified: Secondary | ICD-10-CM | POA: Diagnosis not present

## 2023-02-07 DIAGNOSIS — L0390 Cellulitis, unspecified: Secondary | ICD-10-CM | POA: Diagnosis present

## 2023-02-07 DIAGNOSIS — R279 Unspecified lack of coordination: Secondary | ICD-10-CM | POA: Diagnosis present

## 2023-02-07 DIAGNOSIS — Z7901 Long term (current) use of anticoagulants: Secondary | ICD-10-CM | POA: Diagnosis not present

## 2023-02-13 DIAGNOSIS — Z6841 Body Mass Index (BMI) 40.0 and over, adult: Secondary | ICD-10-CM | POA: Diagnosis not present

## 2023-02-13 DIAGNOSIS — I517 Cardiomegaly: Secondary | ICD-10-CM | POA: Diagnosis not present

## 2023-02-13 DIAGNOSIS — I480 Paroxysmal atrial fibrillation: Secondary | ICD-10-CM | POA: Diagnosis not present

## 2023-02-13 DIAGNOSIS — Z9911 Dependence on respirator [ventilator] status: Secondary | ICD-10-CM | POA: Diagnosis not present

## 2023-02-13 DIAGNOSIS — R0603 Acute respiratory distress: Secondary | ICD-10-CM | POA: Diagnosis not present

## 2023-02-13 DIAGNOSIS — E662 Morbid (severe) obesity with alveolar hypoventilation: Secondary | ICD-10-CM | POA: Diagnosis not present

## 2023-02-13 DIAGNOSIS — J449 Chronic obstructive pulmonary disease, unspecified: Secondary | ICD-10-CM | POA: Diagnosis not present

## 2023-02-13 DIAGNOSIS — J9611 Chronic respiratory failure with hypoxia: Secondary | ICD-10-CM | POA: Diagnosis not present

## 2023-02-13 DIAGNOSIS — J45998 Other asthma: Secondary | ICD-10-CM | POA: Diagnosis not present

## 2023-02-13 DIAGNOSIS — J9621 Acute and chronic respiratory failure with hypoxia: Secondary | ICD-10-CM | POA: Diagnosis not present

## 2023-02-13 DIAGNOSIS — M7989 Other specified soft tissue disorders: Secondary | ICD-10-CM | POA: Diagnosis not present

## 2023-02-13 DIAGNOSIS — J441 Chronic obstructive pulmonary disease with (acute) exacerbation: Secondary | ICD-10-CM | POA: Diagnosis not present

## 2023-02-13 DIAGNOSIS — L24A0 Irritant contact dermatitis due to friction or contact with body fluids, unspecified: Secondary | ICD-10-CM | POA: Diagnosis not present

## 2023-02-13 DIAGNOSIS — L539 Erythematous condition, unspecified: Secondary | ICD-10-CM | POA: Diagnosis not present

## 2023-02-13 DIAGNOSIS — J90 Pleural effusion, not elsewhere classified: Secondary | ICD-10-CM | POA: Diagnosis not present

## 2023-02-13 DIAGNOSIS — B372 Candidiasis of skin and nail: Secondary | ICD-10-CM | POA: Diagnosis present

## 2023-02-13 DIAGNOSIS — I482 Chronic atrial fibrillation, unspecified: Secondary | ICD-10-CM | POA: Diagnosis not present

## 2023-02-13 DIAGNOSIS — I4821 Permanent atrial fibrillation: Secondary | ICD-10-CM | POA: Diagnosis not present

## 2023-02-13 DIAGNOSIS — N179 Acute kidney failure, unspecified: Secondary | ICD-10-CM | POA: Diagnosis present

## 2023-02-13 DIAGNOSIS — L03317 Cellulitis of buttock: Secondary | ICD-10-CM | POA: Diagnosis not present

## 2023-02-13 DIAGNOSIS — F4321 Adjustment disorder with depressed mood: Secondary | ICD-10-CM | POA: Diagnosis not present

## 2023-02-13 DIAGNOSIS — F32A Depression, unspecified: Secondary | ICD-10-CM | POA: Diagnosis not present

## 2023-02-13 DIAGNOSIS — D62 Acute posthemorrhagic anemia: Secondary | ICD-10-CM | POA: Diagnosis not present

## 2023-02-13 DIAGNOSIS — R279 Unspecified lack of coordination: Secondary | ICD-10-CM | POA: Diagnosis not present

## 2023-02-13 DIAGNOSIS — K3189 Other diseases of stomach and duodenum: Secondary | ICD-10-CM | POA: Diagnosis not present

## 2023-02-13 DIAGNOSIS — Z515 Encounter for palliative care: Secondary | ICD-10-CM | POA: Diagnosis not present

## 2023-02-13 DIAGNOSIS — R601 Generalized edema: Secondary | ICD-10-CM | POA: Diagnosis not present

## 2023-02-13 DIAGNOSIS — Z66 Do not resuscitate: Secondary | ICD-10-CM | POA: Diagnosis not present

## 2023-02-13 DIAGNOSIS — A419 Sepsis, unspecified organism: Secondary | ICD-10-CM | POA: Diagnosis present

## 2023-02-13 DIAGNOSIS — Z7401 Bed confinement status: Secondary | ICD-10-CM | POA: Diagnosis not present

## 2023-02-13 DIAGNOSIS — L03113 Cellulitis of right upper limb: Secondary | ICD-10-CM | POA: Diagnosis not present

## 2023-02-13 DIAGNOSIS — I130 Hypertensive heart and chronic kidney disease with heart failure and stage 1 through stage 4 chronic kidney disease, or unspecified chronic kidney disease: Secondary | ICD-10-CM | POA: Diagnosis not present

## 2023-02-13 DIAGNOSIS — L89152 Pressure ulcer of sacral region, stage 2: Secondary | ICD-10-CM | POA: Diagnosis not present

## 2023-02-13 DIAGNOSIS — Z79899 Other long term (current) drug therapy: Secondary | ICD-10-CM | POA: Diagnosis not present

## 2023-02-13 DIAGNOSIS — Z789 Other specified health status: Secondary | ICD-10-CM | POA: Diagnosis not present

## 2023-02-13 DIAGNOSIS — A4189 Other specified sepsis: Secondary | ICD-10-CM | POA: Diagnosis not present

## 2023-02-13 DIAGNOSIS — L89322 Pressure ulcer of left buttock, stage 2: Secondary | ICD-10-CM | POA: Diagnosis present

## 2023-02-13 DIAGNOSIS — J9811 Atelectasis: Secondary | ICD-10-CM | POA: Diagnosis not present

## 2023-02-13 DIAGNOSIS — E669 Obesity, unspecified: Secondary | ICD-10-CM | POA: Diagnosis not present

## 2023-02-13 DIAGNOSIS — J1282 Pneumonia due to coronavirus disease 2019: Secondary | ICD-10-CM | POA: Diagnosis not present

## 2023-02-13 DIAGNOSIS — J440 Chronic obstructive pulmonary disease with acute lower respiratory infection: Secondary | ICD-10-CM | POA: Diagnosis present

## 2023-02-13 DIAGNOSIS — F330 Major depressive disorder, recurrent, mild: Secondary | ICD-10-CM | POA: Diagnosis not present

## 2023-02-13 DIAGNOSIS — I10 Essential (primary) hypertension: Secondary | ICD-10-CM | POA: Diagnosis not present

## 2023-02-13 DIAGNOSIS — J80 Acute respiratory distress syndrome: Secondary | ICD-10-CM | POA: Diagnosis not present

## 2023-02-13 DIAGNOSIS — Z8616 Personal history of COVID-19: Secondary | ICD-10-CM | POA: Diagnosis not present

## 2023-02-13 DIAGNOSIS — I5033 Acute on chronic diastolic (congestive) heart failure: Secondary | ICD-10-CM | POA: Diagnosis not present

## 2023-02-13 DIAGNOSIS — Z4682 Encounter for fitting and adjustment of non-vascular catheter: Secondary | ICD-10-CM | POA: Diagnosis not present

## 2023-02-13 DIAGNOSIS — Z7189 Other specified counseling: Secondary | ICD-10-CM | POA: Diagnosis not present

## 2023-02-13 DIAGNOSIS — J159 Unspecified bacterial pneumonia: Secondary | ICD-10-CM | POA: Diagnosis not present

## 2023-02-13 DIAGNOSIS — L0390 Cellulitis, unspecified: Secondary | ICD-10-CM | POA: Diagnosis present

## 2023-02-13 DIAGNOSIS — E039 Hypothyroidism, unspecified: Secondary | ICD-10-CM | POA: Diagnosis not present

## 2023-02-13 DIAGNOSIS — F3340 Major depressive disorder, recurrent, in remission, unspecified: Secondary | ICD-10-CM | POA: Diagnosis not present

## 2023-02-13 DIAGNOSIS — G4733 Obstructive sleep apnea (adult) (pediatric): Secondary | ICD-10-CM | POA: Diagnosis not present

## 2023-02-13 DIAGNOSIS — N3946 Mixed incontinence: Secondary | ICD-10-CM | POA: Diagnosis not present

## 2023-02-13 DIAGNOSIS — I890 Lymphedema, not elsewhere classified: Secondary | ICD-10-CM | POA: Diagnosis not present

## 2023-02-13 DIAGNOSIS — U071 COVID-19: Secondary | ICD-10-CM | POA: Diagnosis not present

## 2023-02-13 DIAGNOSIS — J45902 Unspecified asthma with status asthmaticus: Secondary | ICD-10-CM | POA: Diagnosis not present

## 2023-02-13 DIAGNOSIS — L03115 Cellulitis of right lower limb: Secondary | ICD-10-CM | POA: Diagnosis present

## 2023-02-13 DIAGNOSIS — I4891 Unspecified atrial fibrillation: Secondary | ICD-10-CM | POA: Diagnosis present

## 2023-02-13 DIAGNOSIS — M169 Osteoarthritis of hip, unspecified: Secondary | ICD-10-CM | POA: Diagnosis not present

## 2023-02-13 DIAGNOSIS — R7303 Prediabetes: Secondary | ICD-10-CM | POA: Diagnosis not present

## 2023-02-13 DIAGNOSIS — R6521 Severe sepsis with septic shock: Secondary | ICD-10-CM | POA: Diagnosis not present

## 2023-02-13 DIAGNOSIS — J9622 Acute and chronic respiratory failure with hypercapnia: Secondary | ICD-10-CM | POA: Diagnosis not present

## 2023-02-13 DIAGNOSIS — R0902 Hypoxemia: Secondary | ICD-10-CM | POA: Diagnosis not present

## 2023-02-13 DIAGNOSIS — N1831 Chronic kidney disease, stage 3a: Secondary | ICD-10-CM | POA: Diagnosis not present

## 2023-02-13 DIAGNOSIS — I959 Hypotension, unspecified: Secondary | ICD-10-CM | POA: Diagnosis not present

## 2023-02-13 DIAGNOSIS — E870 Hyperosmolality and hypernatremia: Secondary | ICD-10-CM | POA: Diagnosis not present

## 2023-02-13 DIAGNOSIS — M6281 Muscle weakness (generalized): Secondary | ICD-10-CM | POA: Diagnosis present

## 2023-02-13 DIAGNOSIS — I509 Heart failure, unspecified: Secondary | ICD-10-CM | POA: Diagnosis not present

## 2023-02-14 DIAGNOSIS — E039 Hypothyroidism, unspecified: Secondary | ICD-10-CM | POA: Diagnosis not present

## 2023-02-14 DIAGNOSIS — I10 Essential (primary) hypertension: Secondary | ICD-10-CM | POA: Diagnosis not present

## 2023-02-14 DIAGNOSIS — L03115 Cellulitis of right lower limb: Secondary | ICD-10-CM | POA: Diagnosis not present

## 2023-02-14 DIAGNOSIS — F3340 Major depressive disorder, recurrent, in remission, unspecified: Secondary | ICD-10-CM | POA: Diagnosis not present

## 2023-02-14 DIAGNOSIS — I482 Chronic atrial fibrillation, unspecified: Secondary | ICD-10-CM | POA: Diagnosis not present

## 2023-02-14 DIAGNOSIS — J449 Chronic obstructive pulmonary disease, unspecified: Secondary | ICD-10-CM | POA: Diagnosis not present

## 2023-02-15 DIAGNOSIS — I10 Essential (primary) hypertension: Secondary | ICD-10-CM | POA: Diagnosis not present

## 2023-02-15 DIAGNOSIS — F3340 Major depressive disorder, recurrent, in remission, unspecified: Secondary | ICD-10-CM | POA: Diagnosis not present

## 2023-02-15 DIAGNOSIS — I482 Chronic atrial fibrillation, unspecified: Secondary | ICD-10-CM | POA: Diagnosis not present

## 2023-02-15 DIAGNOSIS — J449 Chronic obstructive pulmonary disease, unspecified: Secondary | ICD-10-CM | POA: Diagnosis not present

## 2023-02-15 DIAGNOSIS — L03115 Cellulitis of right lower limb: Secondary | ICD-10-CM | POA: Diagnosis not present

## 2023-02-15 DIAGNOSIS — E039 Hypothyroidism, unspecified: Secondary | ICD-10-CM | POA: Diagnosis not present

## 2023-02-18 DIAGNOSIS — F3340 Major depressive disorder, recurrent, in remission, unspecified: Secondary | ICD-10-CM | POA: Diagnosis not present

## 2023-02-18 DIAGNOSIS — J449 Chronic obstructive pulmonary disease, unspecified: Secondary | ICD-10-CM | POA: Diagnosis not present

## 2023-02-18 DIAGNOSIS — I10 Essential (primary) hypertension: Secondary | ICD-10-CM | POA: Diagnosis not present

## 2023-02-18 DIAGNOSIS — E039 Hypothyroidism, unspecified: Secondary | ICD-10-CM | POA: Diagnosis not present

## 2023-02-18 DIAGNOSIS — I482 Chronic atrial fibrillation, unspecified: Secondary | ICD-10-CM | POA: Diagnosis not present

## 2023-02-18 DIAGNOSIS — L03115 Cellulitis of right lower limb: Secondary | ICD-10-CM | POA: Diagnosis not present

## 2023-02-20 DIAGNOSIS — I10 Essential (primary) hypertension: Secondary | ICD-10-CM | POA: Diagnosis not present

## 2023-02-20 DIAGNOSIS — L24A0 Irritant contact dermatitis due to friction or contact with body fluids, unspecified: Secondary | ICD-10-CM | POA: Diagnosis not present

## 2023-02-20 DIAGNOSIS — N3946 Mixed incontinence: Secondary | ICD-10-CM | POA: Diagnosis not present

## 2023-02-20 DIAGNOSIS — I482 Chronic atrial fibrillation, unspecified: Secondary | ICD-10-CM | POA: Diagnosis not present

## 2023-02-20 DIAGNOSIS — N179 Acute kidney failure, unspecified: Secondary | ICD-10-CM | POA: Diagnosis not present

## 2023-02-20 DIAGNOSIS — F3340 Major depressive disorder, recurrent, in remission, unspecified: Secondary | ICD-10-CM | POA: Diagnosis not present

## 2023-02-20 DIAGNOSIS — M6281 Muscle weakness (generalized): Secondary | ICD-10-CM | POA: Diagnosis not present

## 2023-02-20 DIAGNOSIS — A419 Sepsis, unspecified organism: Secondary | ICD-10-CM | POA: Diagnosis not present

## 2023-02-20 DIAGNOSIS — I890 Lymphedema, not elsewhere classified: Secondary | ICD-10-CM | POA: Diagnosis not present

## 2023-02-20 DIAGNOSIS — E039 Hypothyroidism, unspecified: Secondary | ICD-10-CM | POA: Diagnosis not present

## 2023-02-20 DIAGNOSIS — I4891 Unspecified atrial fibrillation: Secondary | ICD-10-CM | POA: Diagnosis not present

## 2023-02-20 DIAGNOSIS — L03115 Cellulitis of right lower limb: Secondary | ICD-10-CM | POA: Diagnosis not present

## 2023-02-20 DIAGNOSIS — J449 Chronic obstructive pulmonary disease, unspecified: Secondary | ICD-10-CM | POA: Diagnosis not present

## 2023-02-20 DIAGNOSIS — J9611 Chronic respiratory failure with hypoxia: Secondary | ICD-10-CM | POA: Diagnosis not present

## 2023-02-22 DIAGNOSIS — I10 Essential (primary) hypertension: Secondary | ICD-10-CM | POA: Diagnosis not present

## 2023-02-22 DIAGNOSIS — E039 Hypothyroidism, unspecified: Secondary | ICD-10-CM | POA: Diagnosis not present

## 2023-02-22 DIAGNOSIS — F3340 Major depressive disorder, recurrent, in remission, unspecified: Secondary | ICD-10-CM | POA: Diagnosis not present

## 2023-02-22 DIAGNOSIS — I482 Chronic atrial fibrillation, unspecified: Secondary | ICD-10-CM | POA: Diagnosis not present

## 2023-02-22 DIAGNOSIS — J449 Chronic obstructive pulmonary disease, unspecified: Secondary | ICD-10-CM | POA: Diagnosis not present

## 2023-02-22 DIAGNOSIS — L03115 Cellulitis of right lower limb: Secondary | ICD-10-CM | POA: Diagnosis not present

## 2023-02-25 DIAGNOSIS — I482 Chronic atrial fibrillation, unspecified: Secondary | ICD-10-CM | POA: Diagnosis not present

## 2023-02-25 DIAGNOSIS — J449 Chronic obstructive pulmonary disease, unspecified: Secondary | ICD-10-CM | POA: Diagnosis not present

## 2023-02-25 DIAGNOSIS — L03115 Cellulitis of right lower limb: Secondary | ICD-10-CM | POA: Diagnosis not present

## 2023-02-25 DIAGNOSIS — I10 Essential (primary) hypertension: Secondary | ICD-10-CM | POA: Diagnosis not present

## 2023-02-25 DIAGNOSIS — F3340 Major depressive disorder, recurrent, in remission, unspecified: Secondary | ICD-10-CM | POA: Diagnosis not present

## 2023-02-25 DIAGNOSIS — E039 Hypothyroidism, unspecified: Secondary | ICD-10-CM | POA: Diagnosis not present

## 2023-02-27 DIAGNOSIS — I890 Lymphedema, not elsewhere classified: Secondary | ICD-10-CM | POA: Diagnosis not present

## 2023-02-27 DIAGNOSIS — F3340 Major depressive disorder, recurrent, in remission, unspecified: Secondary | ICD-10-CM | POA: Diagnosis not present

## 2023-02-27 DIAGNOSIS — M6281 Muscle weakness (generalized): Secondary | ICD-10-CM | POA: Diagnosis not present

## 2023-02-27 DIAGNOSIS — I10 Essential (primary) hypertension: Secondary | ICD-10-CM | POA: Diagnosis not present

## 2023-02-27 DIAGNOSIS — J9611 Chronic respiratory failure with hypoxia: Secondary | ICD-10-CM | POA: Diagnosis not present

## 2023-02-27 DIAGNOSIS — J449 Chronic obstructive pulmonary disease, unspecified: Secondary | ICD-10-CM | POA: Diagnosis not present

## 2023-02-27 DIAGNOSIS — E039 Hypothyroidism, unspecified: Secondary | ICD-10-CM | POA: Diagnosis not present

## 2023-02-27 DIAGNOSIS — L24A0 Irritant contact dermatitis due to friction or contact with body fluids, unspecified: Secondary | ICD-10-CM | POA: Diagnosis not present

## 2023-02-27 DIAGNOSIS — N179 Acute kidney failure, unspecified: Secondary | ICD-10-CM | POA: Diagnosis not present

## 2023-02-27 DIAGNOSIS — I482 Chronic atrial fibrillation, unspecified: Secondary | ICD-10-CM | POA: Diagnosis not present

## 2023-02-27 DIAGNOSIS — L03115 Cellulitis of right lower limb: Secondary | ICD-10-CM | POA: Diagnosis not present

## 2023-02-27 DIAGNOSIS — N3946 Mixed incontinence: Secondary | ICD-10-CM | POA: Diagnosis not present

## 2023-02-27 DIAGNOSIS — A419 Sepsis, unspecified organism: Secondary | ICD-10-CM | POA: Diagnosis not present

## 2023-02-27 DIAGNOSIS — I4891 Unspecified atrial fibrillation: Secondary | ICD-10-CM | POA: Diagnosis not present

## 2023-03-01 DIAGNOSIS — L03115 Cellulitis of right lower limb: Secondary | ICD-10-CM | POA: Diagnosis not present

## 2023-03-01 DIAGNOSIS — J449 Chronic obstructive pulmonary disease, unspecified: Secondary | ICD-10-CM | POA: Diagnosis not present

## 2023-03-01 DIAGNOSIS — F3340 Major depressive disorder, recurrent, in remission, unspecified: Secondary | ICD-10-CM | POA: Diagnosis not present

## 2023-03-01 DIAGNOSIS — E039 Hypothyroidism, unspecified: Secondary | ICD-10-CM | POA: Diagnosis not present

## 2023-03-01 DIAGNOSIS — I482 Chronic atrial fibrillation, unspecified: Secondary | ICD-10-CM | POA: Diagnosis not present

## 2023-03-01 DIAGNOSIS — I10 Essential (primary) hypertension: Secondary | ICD-10-CM | POA: Diagnosis not present

## 2023-03-04 DIAGNOSIS — L03115 Cellulitis of right lower limb: Secondary | ICD-10-CM | POA: Diagnosis not present

## 2023-03-04 DIAGNOSIS — E039 Hypothyroidism, unspecified: Secondary | ICD-10-CM | POA: Diagnosis not present

## 2023-03-04 DIAGNOSIS — J449 Chronic obstructive pulmonary disease, unspecified: Secondary | ICD-10-CM | POA: Diagnosis not present

## 2023-03-04 DIAGNOSIS — I10 Essential (primary) hypertension: Secondary | ICD-10-CM | POA: Diagnosis not present

## 2023-03-04 DIAGNOSIS — I482 Chronic atrial fibrillation, unspecified: Secondary | ICD-10-CM | POA: Diagnosis not present

## 2023-03-04 DIAGNOSIS — F3340 Major depressive disorder, recurrent, in remission, unspecified: Secondary | ICD-10-CM | POA: Diagnosis not present

## 2023-03-06 DIAGNOSIS — I10 Essential (primary) hypertension: Secondary | ICD-10-CM | POA: Diagnosis not present

## 2023-03-06 DIAGNOSIS — I482 Chronic atrial fibrillation, unspecified: Secondary | ICD-10-CM | POA: Diagnosis not present

## 2023-03-06 DIAGNOSIS — L03115 Cellulitis of right lower limb: Secondary | ICD-10-CM | POA: Diagnosis not present

## 2023-03-06 DIAGNOSIS — E039 Hypothyroidism, unspecified: Secondary | ICD-10-CM | POA: Diagnosis not present

## 2023-03-06 DIAGNOSIS — F3340 Major depressive disorder, recurrent, in remission, unspecified: Secondary | ICD-10-CM | POA: Diagnosis not present

## 2023-03-06 DIAGNOSIS — J449 Chronic obstructive pulmonary disease, unspecified: Secondary | ICD-10-CM | POA: Diagnosis not present

## 2023-03-08 DIAGNOSIS — M7989 Other specified soft tissue disorders: Secondary | ICD-10-CM | POA: Diagnosis not present

## 2023-03-08 DIAGNOSIS — I482 Chronic atrial fibrillation, unspecified: Secondary | ICD-10-CM | POA: Diagnosis not present

## 2023-03-08 DIAGNOSIS — J449 Chronic obstructive pulmonary disease, unspecified: Secondary | ICD-10-CM | POA: Diagnosis not present

## 2023-03-08 DIAGNOSIS — L03115 Cellulitis of right lower limb: Secondary | ICD-10-CM | POA: Diagnosis not present

## 2023-03-08 DIAGNOSIS — R601 Generalized edema: Secondary | ICD-10-CM | POA: Diagnosis not present

## 2023-03-08 DIAGNOSIS — E039 Hypothyroidism, unspecified: Secondary | ICD-10-CM | POA: Diagnosis not present

## 2023-03-08 DIAGNOSIS — F3340 Major depressive disorder, recurrent, in remission, unspecified: Secondary | ICD-10-CM | POA: Diagnosis not present

## 2023-03-08 DIAGNOSIS — I10 Essential (primary) hypertension: Secondary | ICD-10-CM | POA: Diagnosis not present

## 2023-03-11 DIAGNOSIS — M7989 Other specified soft tissue disorders: Secondary | ICD-10-CM | POA: Diagnosis not present

## 2023-03-11 DIAGNOSIS — L03115 Cellulitis of right lower limb: Secondary | ICD-10-CM | POA: Diagnosis not present

## 2023-03-11 DIAGNOSIS — I482 Chronic atrial fibrillation, unspecified: Secondary | ICD-10-CM | POA: Diagnosis not present

## 2023-03-11 DIAGNOSIS — E039 Hypothyroidism, unspecified: Secondary | ICD-10-CM | POA: Diagnosis not present

## 2023-03-11 DIAGNOSIS — F3340 Major depressive disorder, recurrent, in remission, unspecified: Secondary | ICD-10-CM | POA: Diagnosis not present

## 2023-03-11 DIAGNOSIS — J449 Chronic obstructive pulmonary disease, unspecified: Secondary | ICD-10-CM | POA: Diagnosis not present

## 2023-03-11 DIAGNOSIS — I10 Essential (primary) hypertension: Secondary | ICD-10-CM | POA: Diagnosis not present

## 2023-03-13 DIAGNOSIS — F3340 Major depressive disorder, recurrent, in remission, unspecified: Secondary | ICD-10-CM | POA: Diagnosis not present

## 2023-03-13 DIAGNOSIS — M7989 Other specified soft tissue disorders: Secondary | ICD-10-CM | POA: Diagnosis not present

## 2023-03-13 DIAGNOSIS — I482 Chronic atrial fibrillation, unspecified: Secondary | ICD-10-CM | POA: Diagnosis not present

## 2023-03-13 DIAGNOSIS — M6281 Muscle weakness (generalized): Secondary | ICD-10-CM | POA: Diagnosis not present

## 2023-03-13 DIAGNOSIS — L03115 Cellulitis of right lower limb: Secondary | ICD-10-CM | POA: Diagnosis not present

## 2023-03-13 DIAGNOSIS — J449 Chronic obstructive pulmonary disease, unspecified: Secondary | ICD-10-CM | POA: Diagnosis not present

## 2023-03-13 DIAGNOSIS — I10 Essential (primary) hypertension: Secondary | ICD-10-CM | POA: Diagnosis not present

## 2023-03-13 DIAGNOSIS — E039 Hypothyroidism, unspecified: Secondary | ICD-10-CM | POA: Diagnosis not present

## 2023-03-15 DIAGNOSIS — M7989 Other specified soft tissue disorders: Secondary | ICD-10-CM | POA: Diagnosis not present

## 2023-03-15 DIAGNOSIS — I10 Essential (primary) hypertension: Secondary | ICD-10-CM | POA: Diagnosis not present

## 2023-03-15 DIAGNOSIS — E039 Hypothyroidism, unspecified: Secondary | ICD-10-CM | POA: Diagnosis not present

## 2023-03-15 DIAGNOSIS — M6281 Muscle weakness (generalized): Secondary | ICD-10-CM | POA: Diagnosis not present

## 2023-03-15 DIAGNOSIS — L03115 Cellulitis of right lower limb: Secondary | ICD-10-CM | POA: Diagnosis not present

## 2023-03-15 DIAGNOSIS — J449 Chronic obstructive pulmonary disease, unspecified: Secondary | ICD-10-CM | POA: Diagnosis not present

## 2023-03-15 DIAGNOSIS — F3340 Major depressive disorder, recurrent, in remission, unspecified: Secondary | ICD-10-CM | POA: Diagnosis not present

## 2023-03-15 DIAGNOSIS — I482 Chronic atrial fibrillation, unspecified: Secondary | ICD-10-CM | POA: Diagnosis not present

## 2023-03-18 DIAGNOSIS — F3340 Major depressive disorder, recurrent, in remission, unspecified: Secondary | ICD-10-CM | POA: Diagnosis not present

## 2023-03-18 DIAGNOSIS — I10 Essential (primary) hypertension: Secondary | ICD-10-CM | POA: Diagnosis not present

## 2023-03-18 DIAGNOSIS — E039 Hypothyroidism, unspecified: Secondary | ICD-10-CM | POA: Diagnosis not present

## 2023-03-18 DIAGNOSIS — I482 Chronic atrial fibrillation, unspecified: Secondary | ICD-10-CM | POA: Diagnosis not present

## 2023-03-18 DIAGNOSIS — L03115 Cellulitis of right lower limb: Secondary | ICD-10-CM | POA: Diagnosis not present

## 2023-03-18 DIAGNOSIS — M6281 Muscle weakness (generalized): Secondary | ICD-10-CM | POA: Diagnosis not present

## 2023-03-18 DIAGNOSIS — M7989 Other specified soft tissue disorders: Secondary | ICD-10-CM | POA: Diagnosis not present

## 2023-03-18 DIAGNOSIS — J449 Chronic obstructive pulmonary disease, unspecified: Secondary | ICD-10-CM | POA: Diagnosis not present

## 2023-03-20 DIAGNOSIS — I10 Essential (primary) hypertension: Secondary | ICD-10-CM | POA: Diagnosis not present

## 2023-03-20 DIAGNOSIS — I482 Chronic atrial fibrillation, unspecified: Secondary | ICD-10-CM | POA: Diagnosis not present

## 2023-03-20 DIAGNOSIS — F3340 Major depressive disorder, recurrent, in remission, unspecified: Secondary | ICD-10-CM | POA: Diagnosis not present

## 2023-03-20 DIAGNOSIS — J449 Chronic obstructive pulmonary disease, unspecified: Secondary | ICD-10-CM | POA: Diagnosis not present

## 2023-03-20 DIAGNOSIS — L03113 Cellulitis of right upper limb: Secondary | ICD-10-CM | POA: Diagnosis not present

## 2023-03-20 DIAGNOSIS — M7989 Other specified soft tissue disorders: Secondary | ICD-10-CM | POA: Diagnosis not present

## 2023-03-20 DIAGNOSIS — E039 Hypothyroidism, unspecified: Secondary | ICD-10-CM | POA: Diagnosis not present

## 2023-03-20 DIAGNOSIS — M6281 Muscle weakness (generalized): Secondary | ICD-10-CM | POA: Diagnosis not present

## 2023-03-21 DIAGNOSIS — L03113 Cellulitis of right upper limb: Secondary | ICD-10-CM | POA: Diagnosis not present

## 2023-03-22 DIAGNOSIS — J449 Chronic obstructive pulmonary disease, unspecified: Secondary | ICD-10-CM | POA: Diagnosis not present

## 2023-03-22 DIAGNOSIS — M7989 Other specified soft tissue disorders: Secondary | ICD-10-CM | POA: Diagnosis not present

## 2023-03-22 DIAGNOSIS — E039 Hypothyroidism, unspecified: Secondary | ICD-10-CM | POA: Diagnosis not present

## 2023-03-22 DIAGNOSIS — L03113 Cellulitis of right upper limb: Secondary | ICD-10-CM | POA: Diagnosis not present

## 2023-03-22 DIAGNOSIS — M6281 Muscle weakness (generalized): Secondary | ICD-10-CM | POA: Diagnosis not present

## 2023-03-22 DIAGNOSIS — F3340 Major depressive disorder, recurrent, in remission, unspecified: Secondary | ICD-10-CM | POA: Diagnosis not present

## 2023-03-22 DIAGNOSIS — I482 Chronic atrial fibrillation, unspecified: Secondary | ICD-10-CM | POA: Diagnosis not present

## 2023-03-22 DIAGNOSIS — I10 Essential (primary) hypertension: Secondary | ICD-10-CM | POA: Diagnosis not present

## 2023-03-25 DIAGNOSIS — J449 Chronic obstructive pulmonary disease, unspecified: Secondary | ICD-10-CM | POA: Diagnosis not present

## 2023-03-25 DIAGNOSIS — E039 Hypothyroidism, unspecified: Secondary | ICD-10-CM | POA: Diagnosis not present

## 2023-03-25 DIAGNOSIS — M7989 Other specified soft tissue disorders: Secondary | ICD-10-CM | POA: Diagnosis not present

## 2023-03-25 DIAGNOSIS — F3340 Major depressive disorder, recurrent, in remission, unspecified: Secondary | ICD-10-CM | POA: Diagnosis not present

## 2023-03-25 DIAGNOSIS — I10 Essential (primary) hypertension: Secondary | ICD-10-CM | POA: Diagnosis not present

## 2023-03-25 DIAGNOSIS — L03113 Cellulitis of right upper limb: Secondary | ICD-10-CM | POA: Diagnosis not present

## 2023-03-25 DIAGNOSIS — M6281 Muscle weakness (generalized): Secondary | ICD-10-CM | POA: Diagnosis not present

## 2023-03-25 DIAGNOSIS — I482 Chronic atrial fibrillation, unspecified: Secondary | ICD-10-CM | POA: Diagnosis not present

## 2023-03-27 ENCOUNTER — Inpatient Hospital Stay (HOSPITAL_COMMUNITY): Payer: Medicare Other

## 2023-03-27 ENCOUNTER — Inpatient Hospital Stay (HOSPITAL_COMMUNITY)
Admission: EM | Admit: 2023-03-27 | Discharge: 2023-04-06 | DRG: 207 | Disposition: A | Discharge status: Hospice | Payer: Medicare Other | Source: Skilled Nursing Facility | Attending: Internal Medicine | Admitting: Internal Medicine

## 2023-03-27 ENCOUNTER — Emergency Department (HOSPITAL_COMMUNITY): Payer: Medicare Other

## 2023-03-27 ENCOUNTER — Encounter (HOSPITAL_COMMUNITY): Payer: Self-pay

## 2023-03-27 ENCOUNTER — Other Ambulatory Visit: Payer: Self-pay

## 2023-03-27 DIAGNOSIS — J9600 Acute respiratory failure, unspecified whether with hypoxia or hypercapnia: Secondary | ICD-10-CM | POA: Diagnosis not present

## 2023-03-27 DIAGNOSIS — L249 Irritant contact dermatitis, unspecified cause: Secondary | ICD-10-CM | POA: Diagnosis present

## 2023-03-27 DIAGNOSIS — L03115 Cellulitis of right lower limb: Secondary | ICD-10-CM | POA: Diagnosis present

## 2023-03-27 DIAGNOSIS — Z66 Do not resuscitate: Secondary | ICD-10-CM | POA: Diagnosis not present

## 2023-03-27 DIAGNOSIS — Z7401 Bed confinement status: Secondary | ICD-10-CM | POA: Diagnosis not present

## 2023-03-27 DIAGNOSIS — J80 Acute respiratory distress syndrome: Secondary | ICD-10-CM | POA: Diagnosis not present

## 2023-03-27 DIAGNOSIS — J90 Pleural effusion, not elsewhere classified: Secondary | ICD-10-CM | POA: Diagnosis not present

## 2023-03-27 DIAGNOSIS — Z781 Physical restraint status: Secondary | ICD-10-CM

## 2023-03-27 DIAGNOSIS — N179 Acute kidney failure, unspecified: Secondary | ICD-10-CM | POA: Diagnosis not present

## 2023-03-27 DIAGNOSIS — R6521 Severe sepsis with septic shock: Secondary | ICD-10-CM | POA: Diagnosis not present

## 2023-03-27 DIAGNOSIS — R451 Restlessness and agitation: Secondary | ICD-10-CM | POA: Diagnosis present

## 2023-03-27 DIAGNOSIS — Z7951 Long term (current) use of inhaled steroids: Secondary | ICD-10-CM

## 2023-03-27 DIAGNOSIS — J9611 Chronic respiratory failure with hypoxia: Secondary | ICD-10-CM | POA: Diagnosis present

## 2023-03-27 DIAGNOSIS — Z6841 Body Mass Index (BMI) 40.0 and over, adult: Secondary | ICD-10-CM | POA: Diagnosis not present

## 2023-03-27 DIAGNOSIS — K3189 Other diseases of stomach and duodenum: Secondary | ICD-10-CM | POA: Diagnosis not present

## 2023-03-27 DIAGNOSIS — K649 Unspecified hemorrhoids: Secondary | ICD-10-CM | POA: Diagnosis not present

## 2023-03-27 DIAGNOSIS — Z9889 Other specified postprocedural states: Secondary | ICD-10-CM

## 2023-03-27 DIAGNOSIS — I480 Paroxysmal atrial fibrillation: Secondary | ICD-10-CM

## 2023-03-27 DIAGNOSIS — Z8619 Personal history of other infectious and parasitic diseases: Secondary | ICD-10-CM

## 2023-03-27 DIAGNOSIS — R140 Abdominal distension (gaseous): Secondary | ICD-10-CM | POA: Diagnosis not present

## 2023-03-27 DIAGNOSIS — I959 Hypotension, unspecified: Secondary | ICD-10-CM

## 2023-03-27 DIAGNOSIS — I878 Other specified disorders of veins: Secondary | ICD-10-CM | POA: Diagnosis present

## 2023-03-27 DIAGNOSIS — I4891 Unspecified atrial fibrillation: Secondary | ICD-10-CM | POA: Diagnosis not present

## 2023-03-27 DIAGNOSIS — Z5329 Procedure and treatment not carried out because of patient's decision for other reasons: Secondary | ICD-10-CM | POA: Diagnosis not present

## 2023-03-27 DIAGNOSIS — I5033 Acute on chronic diastolic (congestive) heart failure: Secondary | ICD-10-CM | POA: Diagnosis not present

## 2023-03-27 DIAGNOSIS — R410 Disorientation, unspecified: Secondary | ICD-10-CM | POA: Diagnosis present

## 2023-03-27 DIAGNOSIS — J9621 Acute and chronic respiratory failure with hypoxia: Secondary | ICD-10-CM | POA: Diagnosis not present

## 2023-03-27 DIAGNOSIS — U071 COVID-19: Secondary | ICD-10-CM | POA: Diagnosis not present

## 2023-03-27 DIAGNOSIS — E876 Hypokalemia: Secondary | ICD-10-CM | POA: Diagnosis not present

## 2023-03-27 DIAGNOSIS — N1831 Chronic kidney disease, stage 3a: Secondary | ICD-10-CM | POA: Diagnosis not present

## 2023-03-27 DIAGNOSIS — I4821 Permanent atrial fibrillation: Secondary | ICD-10-CM | POA: Diagnosis present

## 2023-03-27 DIAGNOSIS — R531 Weakness: Secondary | ICD-10-CM | POA: Diagnosis not present

## 2023-03-27 DIAGNOSIS — J159 Unspecified bacterial pneumonia: Secondary | ICD-10-CM | POA: Diagnosis present

## 2023-03-27 DIAGNOSIS — J9811 Atelectasis: Secondary | ICD-10-CM | POA: Diagnosis not present

## 2023-03-27 DIAGNOSIS — Z515 Encounter for palliative care: Secondary | ICD-10-CM | POA: Diagnosis not present

## 2023-03-27 DIAGNOSIS — E870 Hyperosmolality and hypernatremia: Secondary | ICD-10-CM | POA: Diagnosis not present

## 2023-03-27 DIAGNOSIS — J441 Chronic obstructive pulmonary disease with (acute) exacerbation: Secondary | ICD-10-CM | POA: Diagnosis not present

## 2023-03-27 DIAGNOSIS — J9622 Acute and chronic respiratory failure with hypercapnia: Secondary | ICD-10-CM | POA: Diagnosis not present

## 2023-03-27 DIAGNOSIS — D62 Acute posthemorrhagic anemia: Secondary | ICD-10-CM | POA: Diagnosis not present

## 2023-03-27 DIAGNOSIS — Z789 Other specified health status: Secondary | ICD-10-CM | POA: Diagnosis not present

## 2023-03-27 DIAGNOSIS — L24A2 Irritant contact dermatitis due to fecal, urinary or dual incontinence: Secondary | ICD-10-CM | POA: Diagnosis present

## 2023-03-27 DIAGNOSIS — Z887 Allergy status to serum and vaccine status: Secondary | ICD-10-CM

## 2023-03-27 DIAGNOSIS — R0603 Acute respiratory distress: Secondary | ICD-10-CM | POA: Diagnosis not present

## 2023-03-27 DIAGNOSIS — Z9049 Acquired absence of other specified parts of digestive tract: Secondary | ICD-10-CM

## 2023-03-27 DIAGNOSIS — Z831 Family history of other infectious and parasitic diseases: Secondary | ICD-10-CM

## 2023-03-27 DIAGNOSIS — A4189 Other specified sepsis: Secondary | ICD-10-CM | POA: Diagnosis not present

## 2023-03-27 DIAGNOSIS — Z86718 Personal history of other venous thrombosis and embolism: Secondary | ICD-10-CM

## 2023-03-27 DIAGNOSIS — Z7901 Long term (current) use of anticoagulants: Secondary | ICD-10-CM

## 2023-03-27 DIAGNOSIS — R0902 Hypoxemia: Secondary | ICD-10-CM | POA: Diagnosis not present

## 2023-03-27 DIAGNOSIS — R918 Other nonspecific abnormal finding of lung field: Secondary | ICD-10-CM | POA: Diagnosis not present

## 2023-03-27 DIAGNOSIS — E662 Morbid (severe) obesity with alveolar hypoventilation: Secondary | ICD-10-CM | POA: Diagnosis present

## 2023-03-27 DIAGNOSIS — Z79899 Other long term (current) drug therapy: Secondary | ICD-10-CM

## 2023-03-27 DIAGNOSIS — Z4682 Encounter for fitting and adjustment of non-vascular catheter: Secondary | ICD-10-CM | POA: Diagnosis not present

## 2023-03-27 DIAGNOSIS — J440 Chronic obstructive pulmonary disease with acute lower respiratory infection: Secondary | ICD-10-CM | POA: Diagnosis present

## 2023-03-27 DIAGNOSIS — Z7189 Other specified counseling: Secondary | ICD-10-CM | POA: Diagnosis not present

## 2023-03-27 DIAGNOSIS — J1282 Pneumonia due to coronavirus disease 2019: Secondary | ICD-10-CM | POA: Diagnosis present

## 2023-03-27 DIAGNOSIS — R0989 Other specified symptoms and signs involving the circulatory and respiratory systems: Secondary | ICD-10-CM | POA: Diagnosis not present

## 2023-03-27 DIAGNOSIS — L304 Erythema intertrigo: Secondary | ICD-10-CM | POA: Diagnosis present

## 2023-03-27 DIAGNOSIS — R195 Other fecal abnormalities: Secondary | ICD-10-CM | POA: Diagnosis not present

## 2023-03-27 DIAGNOSIS — Z9911 Dependence on respirator [ventilator] status: Secondary | ICD-10-CM | POA: Diagnosis not present

## 2023-03-27 DIAGNOSIS — A419 Sepsis, unspecified organism: Secondary | ICD-10-CM | POA: Diagnosis not present

## 2023-03-27 DIAGNOSIS — E8729 Other acidosis: Secondary | ICD-10-CM | POA: Diagnosis present

## 2023-03-27 DIAGNOSIS — Z79890 Hormone replacement therapy (postmenopausal): Secondary | ICD-10-CM

## 2023-03-27 DIAGNOSIS — I517 Cardiomegaly: Secondary | ICD-10-CM | POA: Diagnosis not present

## 2023-03-27 DIAGNOSIS — G4733 Obstructive sleep apnea (adult) (pediatric): Secondary | ICD-10-CM | POA: Diagnosis not present

## 2023-03-27 DIAGNOSIS — Z8349 Family history of other endocrine, nutritional and metabolic diseases: Secondary | ICD-10-CM

## 2023-03-27 DIAGNOSIS — I509 Heart failure, unspecified: Secondary | ICD-10-CM | POA: Diagnosis not present

## 2023-03-27 DIAGNOSIS — I130 Hypertensive heart and chronic kidney disease with heart failure and stage 1 through stage 4 chronic kidney disease, or unspecified chronic kidney disease: Secondary | ICD-10-CM | POA: Diagnosis present

## 2023-03-27 DIAGNOSIS — Z8616 Personal history of COVID-19: Secondary | ICD-10-CM | POA: Diagnosis not present

## 2023-03-27 DIAGNOSIS — L89152 Pressure ulcer of sacral region, stage 2: Secondary | ICD-10-CM | POA: Diagnosis present

## 2023-03-27 DIAGNOSIS — Z8249 Family history of ischemic heart disease and other diseases of the circulatory system: Secondary | ICD-10-CM

## 2023-03-27 DIAGNOSIS — D6489 Other specified anemias: Secondary | ICD-10-CM | POA: Diagnosis present

## 2023-03-27 DIAGNOSIS — J9690 Respiratory failure, unspecified, unspecified whether with hypoxia or hypercapnia: Principal | ICD-10-CM | POA: Diagnosis present

## 2023-03-27 DIAGNOSIS — I9589 Other hypotension: Secondary | ICD-10-CM | POA: Diagnosis not present

## 2023-03-27 DIAGNOSIS — I890 Lymphedema, not elsewhere classified: Secondary | ICD-10-CM | POA: Diagnosis present

## 2023-03-27 DIAGNOSIS — Z9981 Dependence on supplemental oxygen: Secondary | ICD-10-CM

## 2023-03-27 HISTORY — DX: Dependence on renal dialysis: Z99.2

## 2023-03-27 HISTORY — DX: Unspecified visual loss: H54.7

## 2023-03-27 HISTORY — DX: End stage renal disease: N18.6

## 2023-03-27 LAB — POCT I-STAT 7, (LYTES, BLD GAS, ICA,H+H)
Acid-Base Excess: 3 mmol/L — ABNORMAL HIGH (ref 0.0–2.0)
Bicarbonate: 31.5 mmol/L — ABNORMAL HIGH (ref 20.0–28.0)
Calcium, Ion: 1.21 mmol/L (ref 1.15–1.40)
HCT: 31 % — ABNORMAL LOW (ref 36.0–46.0)
Hemoglobin: 10.5 g/dL — ABNORMAL LOW (ref 12.0–15.0)
O2 Saturation: 97 %
Patient temperature: 98
Potassium: 4.7 mmol/L (ref 3.5–5.1)
Sodium: 145 mmol/L (ref 135–145)
TCO2: 34 mmol/L — ABNORMAL HIGH (ref 22–32)
pCO2 arterial: 70.6 mm[Hg] (ref 32–48)
pH, Arterial: 7.256 — ABNORMAL LOW (ref 7.35–7.45)
pO2, Arterial: 111 mm[Hg] — ABNORMAL HIGH (ref 83–108)

## 2023-03-27 LAB — BLOOD GAS, VENOUS
Acid-Base Excess: 7.5 mmol/L — ABNORMAL HIGH (ref 0.0–2.0)
Acid-Base Excess: 8 mmol/L — ABNORMAL HIGH (ref 0.0–2.0)
Bicarbonate: 37 mmol/L — ABNORMAL HIGH (ref 20.0–28.0)
Bicarbonate: 36.5 mmol/L — ABNORMAL HIGH (ref 20.0–28.0)
Drawn by: 442
Drawn by: 68414
O2 Saturation: 44.5 %
O2 Saturation: 41.9 %
Patient temperature: 36.4
Patient temperature: 36.4
pCO2, Ven: 75 mm[Hg] (ref 44–60)
pCO2, Ven: 74 mm[Hg] (ref 44–60)
pH, Ven: 7.3 (ref 7.25–7.43)
pH, Ven: 7.3 (ref 7.25–7.43)
pO2, Ven: <31 mm[Hg] — CL (ref 32–45)
pO2, Ven: <31 mm[Hg] — CL (ref 32–45)

## 2023-03-27 LAB — CBC WITH DIFFERENTIAL/PLATELET
Abs Immature Granulocytes: 0.03 10*3/uL (ref 0.00–0.07)
Basophils Absolute: 0 10*3/uL (ref 0.0–0.1)
Basophils Relative: 0 %
Eosinophils Absolute: 0.1 10*3/uL (ref 0.0–0.5)
Eosinophils Relative: 1 %
HCT: 33.4 % — ABNORMAL LOW (ref 36.0–46.0)
Hemoglobin: 9.4 g/dL — ABNORMAL LOW (ref 12.0–15.0)
Immature Granulocytes: 0 %
Lymphocytes Relative: 19 %
Lymphs Abs: 1.4 10*3/uL (ref 0.7–4.0)
MCH: 26.9 pg (ref 26.0–34.0)
MCHC: 28.1 g/dL — ABNORMAL LOW (ref 30.0–36.0)
MCV: 95.7 fL (ref 80.0–100.0)
Monocytes Absolute: 0.6 10*3/uL (ref 0.1–1.0)
Monocytes Relative: 9 %
Neutro Abs: 5 10*3/uL (ref 1.7–7.7)
Neutrophils Relative %: 71 %
Platelets: 191 10*3/uL (ref 150–400)
RBC: 3.49 MIL/uL — ABNORMAL LOW (ref 3.87–5.11)
RDW: 15.8 % — ABNORMAL HIGH (ref 11.5–15.5)
WBC: 7.1 10*3/uL (ref 4.0–10.5)
nRBC: 0 % (ref 0.0–0.2)

## 2023-03-27 LAB — CBG MONITORING, ED: Glucose-Capillary: 92 mg/dL (ref 70–99)

## 2023-03-27 LAB — BRAIN NATRIURETIC PEPTIDE: B Natriuretic Peptide: 353 pg/mL — ABNORMAL HIGH (ref 0.0–100.0)

## 2023-03-27 LAB — COMPREHENSIVE METABOLIC PANEL
ALT: 30 U/L (ref 0–44)
AST: 27 U/L (ref 15–41)
Albumin: 2.7 g/dL — ABNORMAL LOW (ref 3.5–5.0)
Alkaline Phosphatase: 83 U/L (ref 38–126)
Anion gap: 5 (ref 5–15)
BUN: 29 mg/dL — ABNORMAL HIGH (ref 8–23)
CO2: 33 mmol/L — ABNORMAL HIGH (ref 22–32)
Calcium: 8.6 mg/dL — ABNORMAL LOW (ref 8.9–10.3)
Chloride: 104 mmol/L (ref 98–111)
Creatinine, Ser: 1.27 mg/dL — ABNORMAL HIGH (ref 0.44–1.00)
GFR, Estimated: 44 mL/min — ABNORMAL LOW (ref >60)
Glucose, Bld: 97 mg/dL (ref 70–99)
Potassium: 4.3 mmol/L (ref 3.5–5.1)
Sodium: 142 mmol/L (ref 135–145)
Total Bilirubin: 0.8 mg/dL (ref <1.2)
Total Protein: 7 g/dL (ref 6.5–8.1)

## 2023-03-27 LAB — PROCALCITONIN: Procalcitonin: 0.1 ng/mL

## 2023-03-27 LAB — RESP PANEL BY RT-PCR (RSV, FLU A&B, COVID)  RVPGX2
Influenza A by PCR: NEGATIVE
Influenza B by PCR: NEGATIVE
Resp Syncytial Virus by PCR: NEGATIVE
SARS Coronavirus 2 by RT PCR: POSITIVE — AB

## 2023-03-27 LAB — GLUCOSE, CAPILLARY
Glucose-Capillary: 131 mg/dL — ABNORMAL HIGH (ref 70–99)
Glucose-Capillary: 141 mg/dL — ABNORMAL HIGH (ref 70–99)

## 2023-03-27 LAB — MRSA NEXT GEN BY PCR, NASAL: MRSA by PCR Next Gen: NOT DETECTED

## 2023-03-27 LAB — LACTIC ACID, PLASMA: Lactic Acid, Venous: 1.4 mmol/L (ref 0.5–1.9)

## 2023-03-27 LAB — C-REACTIVE PROTEIN: CRP: 12.1 mg/dL — ABNORMAL HIGH (ref <1.0)

## 2023-03-27 MED ORDER — ALBUTEROL SULFATE (2.5 MG/3ML) 0.083% IN NEBU
7.5000 mg | INHALATION_SOLUTION | Freq: Once | RESPIRATORY_TRACT | Status: AC
Start: 2023-03-27 — End: 2023-03-27
  Administered 2023-03-27: 7.5 mg via RESPIRATORY_TRACT
  Filled 2023-03-27: qty 9

## 2023-03-27 MED ORDER — ETOMIDATE 2 MG/ML IV SOLN
INTRAVENOUS | Status: AC
  Filled 2023-03-27: qty 20

## 2023-03-27 MED ORDER — MAGNESIUM SULFATE 2 GM/50ML IV SOLN
2.0000 g | Freq: Once | INTRAVENOUS | Status: AC
  Administered 2023-03-27: 2 g via INTRAVENOUS
  Filled 2023-03-27: qty 50

## 2023-03-27 MED ORDER — SUCCINYLCHOLINE CHLORIDE 20 MG/ML IJ SOLN
INTRAMUSCULAR | Status: DC | PRN
  Administered 2023-03-27: 100 mg via INTRAVENOUS

## 2023-03-27 MED ORDER — POLYETHYLENE GLYCOL 3350 17 G PO PACK
17.0000 g | PACK | Freq: Every day | ORAL | Status: DC
  Administered 2023-03-27 – 2023-03-29 (×3): 17 g
  Filled 2023-03-27 (×3): qty 1

## 2023-03-27 MED ORDER — SODIUM CHLORIDE 0.9 % IV BOLUS
500.0000 mL | Freq: Once | INTRAVENOUS | Status: AC
  Administered 2023-03-27: 500 mL via INTRAVENOUS

## 2023-03-27 MED ORDER — ETOMIDATE 2 MG/ML IV SOLN
INTRAVENOUS | Status: DC | PRN
  Administered 2023-03-27: 20 mg via INTRAVENOUS

## 2023-03-27 MED ORDER — SUCCINYLCHOLINE CHLORIDE 200 MG/10ML IV SOSY
PREFILLED_SYRINGE | INTRAVENOUS | Status: AC
  Filled 2023-03-27: qty 10

## 2023-03-27 MED ORDER — DOCUSATE SODIUM 50 MG/5ML PO LIQD
100.0000 mg | Freq: Two times a day (BID) | ORAL | Status: DC
  Administered 2023-03-27 – 2023-03-30 (×7): 100 mg
  Filled 2023-03-27 (×7): qty 10

## 2023-03-27 MED ORDER — MIDAZOLAM HCL 2 MG/2ML IJ SOLN
2.0000 mg | Freq: Once | INTRAMUSCULAR | Status: AC
Start: 2023-03-27 — End: 2023-03-27
  Administered 2023-03-27: 2 mg via INTRAVENOUS
  Filled 2023-03-27: qty 2

## 2023-03-27 MED ORDER — PROPOFOL 1000 MG/100ML IV EMUL
5.0000 ug/kg/min | INTRAVENOUS | Status: DC
  Administered 2023-03-28: 50 ug/kg/min via INTRAVENOUS
  Administered 2023-03-28: 40 ug/kg/min via INTRAVENOUS
  Administered 2023-03-28: 50 ug/kg/min via INTRAVENOUS
  Administered 2023-03-28 (×3): 40 ug/kg/min via INTRAVENOUS
  Administered 2023-03-28: 20 ug/kg/min via INTRAVENOUS
  Administered 2023-03-28: 40 ug/kg/min via INTRAVENOUS
  Administered 2023-03-28: 20 ug/kg/min via INTRAVENOUS
  Administered 2023-03-29 (×8): 40 ug/kg/min via INTRAVENOUS
  Administered 2023-03-29 – 2023-03-30 (×4): 20 ug/kg/min via INTRAVENOUS
  Administered 2023-03-30: 10 ug/kg/min via INTRAVENOUS
  Administered 2023-03-30: 20 ug/kg/min via INTRAVENOUS
  Administered 2023-03-31 (×2): 10 ug/kg/min via INTRAVENOUS
  Administered 2023-04-01 – 2023-04-02 (×3): 5 ug/kg/min via INTRAVENOUS
  Filled 2023-03-27 (×23): qty 100
  Filled 2023-03-27: qty 200
  Filled 2023-03-27: qty 100
  Filled 2023-03-27: qty 200

## 2023-03-27 MED ORDER — IPRATROPIUM BROMIDE 0.02 % IN SOLN
1.0000 mg | RESPIRATORY_TRACT | Status: AC
  Administered 2023-03-27: 1 mg via RESPIRATORY_TRACT
  Filled 2023-03-27: qty 5

## 2023-03-27 MED ORDER — SODIUM CHLORIDE 0.9 % IV SOLN
200.0000 mg | Freq: Once | INTRAVENOUS | Status: AC
  Administered 2023-03-28: 200 mg via INTRAVENOUS
  Filled 2023-03-27: qty 40

## 2023-03-27 MED ORDER — NOREPINEPHRINE 4 MG/250ML-% IV SOLN
INTRAVENOUS | Status: AC
  Administered 2023-03-27: 7 ug/min via INTRAVENOUS
  Filled 2023-03-27: qty 250

## 2023-03-27 MED ORDER — PROPOFOL 1000 MG/100ML IV EMUL
INTRAVENOUS | Status: AC
  Administered 2023-03-27: 30 ug/kg/min via INTRAVENOUS
  Filled 2023-03-27: qty 100

## 2023-03-27 MED ORDER — NOREPINEPHRINE 4 MG/250ML-% IV SOLN
0.0000 ug/min | INTRAVENOUS | Status: AC
  Administered 2023-03-29: 9 ug/min via INTRAVENOUS
  Administered 2023-03-29: 10 ug/min via INTRAVENOUS
  Administered 2023-03-29: 4 ug/min via INTRAVENOUS
  Administered 2023-03-30: 8 ug/min via INTRAVENOUS
  Filled 2023-03-27 (×6): qty 250

## 2023-03-27 MED ORDER — FENTANYL CITRATE PF 50 MCG/ML IJ SOSY
25.0000 ug | PREFILLED_SYRINGE | INTRAMUSCULAR | Status: DC | PRN
  Administered 2023-03-28 – 2023-03-29 (×2): 100 ug via INTRAVENOUS
  Filled 2023-03-27 (×2): qty 2

## 2023-03-27 MED ORDER — FENTANYL CITRATE PF 50 MCG/ML IJ SOSY: 50.0000 ug | PREFILLED_SYRINGE | INTRAMUSCULAR | Status: DC | PRN

## 2023-03-27 MED ORDER — ORAL CARE MOUTH RINSE: 15.0000 mL | OROMUCOSAL | Status: DC | PRN

## 2023-03-27 MED ORDER — METHYLPREDNISOLONE SODIUM SUCC 125 MG IJ SOLR
125.0000 mg | Freq: Once | INTRAMUSCULAR | Status: AC
  Administered 2023-03-27: 125 mg via INTRAVENOUS
  Filled 2023-03-27: qty 2

## 2023-03-27 MED ORDER — PANTOPRAZOLE SODIUM 40 MG IV SOLR
40.0000 mg | Freq: Every day | INTRAVENOUS | Status: DC
  Administered 2023-03-27 – 2023-03-30 (×4): 40 mg via INTRAVENOUS
  Filled 2023-03-27 (×4): qty 10

## 2023-03-27 MED ORDER — CHLORHEXIDINE GLUCONATE CLOTH 2 % EX PADS
6.0000 | MEDICATED_PAD | Freq: Every day | CUTANEOUS | Status: DC
  Administered 2023-03-27 – 2023-04-05 (×9): 6 via TOPICAL

## 2023-03-27 MED ORDER — SODIUM CHLORIDE 0.9 % IV SOLN
2.0000 g | INTRAVENOUS | Status: DC
  Administered 2023-03-27 – 2023-03-31 (×5): 2 g via INTRAVENOUS
  Filled 2023-03-27 (×6): qty 20

## 2023-03-27 MED ORDER — SODIUM CHLORIDE 0.9 % IV SOLN
100.0000 mg | Freq: Every day | INTRAVENOUS | Status: DC
  Filled 2023-03-27: qty 20

## 2023-03-27 MED ORDER — DOCUSATE SODIUM 100 MG PO CAPS: 100.0000 mg | ORAL_CAPSULE | Freq: Two times a day (BID) | ORAL | Status: DC | PRN

## 2023-03-27 MED ORDER — IPRATROPIUM-ALBUTEROL 0.5-2.5 (3) MG/3ML IN SOLN
3.0000 mL | Freq: Once | RESPIRATORY_TRACT | Status: AC
  Administered 2023-03-27: 3 mL via RESPIRATORY_TRACT
  Filled 2023-03-27: qty 3

## 2023-03-27 MED ORDER — INSULIN ASPART 100 UNIT/ML IJ SOLN
0.0000 [IU] | INTRAMUSCULAR | Status: DC
  Administered 2023-03-27 – 2023-03-29 (×9): 1 [IU] via SUBCUTANEOUS
  Administered 2023-03-29: 3 [IU] via SUBCUTANEOUS
  Administered 2023-03-29: 2 [IU] via SUBCUTANEOUS
  Administered 2023-03-29 (×2): 1 [IU] via SUBCUTANEOUS
  Administered 2023-03-30: 2 [IU] via SUBCUTANEOUS
  Administered 2023-03-30: 3 [IU] via SUBCUTANEOUS
  Administered 2023-03-30 (×3): 2 [IU] via SUBCUTANEOUS
  Administered 2023-03-30: 1 [IU] via SUBCUTANEOUS
  Administered 2023-03-31 – 2023-04-01 (×6): 2 [IU] via SUBCUTANEOUS
  Administered 2023-04-01: 1 [IU] via SUBCUTANEOUS
  Administered 2023-04-01: 2 [IU] via SUBCUTANEOUS
  Administered 2023-04-01: 1 [IU] via SUBCUTANEOUS
  Administered 2023-04-01: 2 [IU] via SUBCUTANEOUS
  Administered 2023-04-01: 1 [IU] via SUBCUTANEOUS
  Administered 2023-04-02 – 2023-04-03 (×10): 2 [IU] via SUBCUTANEOUS
  Administered 2023-04-03: 1 [IU] via SUBCUTANEOUS
  Administered 2023-04-04 (×2): 3 [IU] via SUBCUTANEOUS
  Administered 2023-04-04 (×3): 2 [IU] via SUBCUTANEOUS
  Administered 2023-04-04: 3 [IU] via SUBCUTANEOUS
  Administered 2023-04-04: 2 [IU] via SUBCUTANEOUS
  Administered 2023-04-05 (×2): 3 [IU] via SUBCUTANEOUS

## 2023-03-27 MED ORDER — ORAL CARE MOUTH RINSE
15.0000 mL | OROMUCOSAL | Status: DC
  Administered 2023-03-27 – 2023-04-05 (×104): 15 mL via OROMUCOSAL

## 2023-03-27 MED ORDER — SODIUM CHLORIDE 0.9 % IV SOLN
500.0000 mg | INTRAVENOUS | Status: AC
  Administered 2023-03-27 – 2023-03-31 (×5): 500 mg via INTRAVENOUS
  Filled 2023-03-27 (×5): qty 5

## 2023-03-27 MED ORDER — ALBUTEROL SULFATE (2.5 MG/3ML) 0.083% IN NEBU
10.0000 mg | INHALATION_SOLUTION | Freq: Once | RESPIRATORY_TRACT | Status: AC
  Administered 2023-03-27: 10 mg via RESPIRATORY_TRACT
  Filled 2023-03-27: qty 12

## 2023-03-27 MED ORDER — METHYLPREDNISOLONE SODIUM SUCC 40 MG IJ SOLR
40.0000 mg | Freq: Two times a day (BID) | INTRAMUSCULAR | Status: DC
Start: 2023-03-28 — End: 2023-03-27

## 2023-03-27 MED ORDER — SODIUM CHLORIDE 0.9 % IV SOLN
250.0000 mL | INTRAVENOUS | Status: DC
  Administered 2023-03-28: 250 mL via INTRAVENOUS

## 2023-03-27 MED ORDER — POLYETHYLENE GLYCOL 3350 17 G PO PACK: 17.0000 g | PACK | Freq: Every day | ORAL | Status: DC | PRN

## 2023-03-27 MED ORDER — METHYLPREDNISOLONE SODIUM SUCC 125 MG IJ SOLR: 80.0000 mg | Freq: Two times a day (BID) | INTRAMUSCULAR | Status: DC

## 2023-03-27 MED ORDER — FENTANYL CITRATE PF 50 MCG/ML IJ SOSY: 25.0000 ug | PREFILLED_SYRINGE | INTRAMUSCULAR | Status: DC | PRN

## 2023-03-27 NOTE — ED Notes (Signed)
Anesthesia at bedside for intubation 

## 2023-03-27 NOTE — ED Notes (Signed)
Pt given fentanyl and 2mg  versed by [facility] and their protocol

## 2023-03-27 NOTE — Progress Notes (Signed)
Called to intubate this patient prior to transport to [facility].  She presented with COPD exacerbation, hypoxia, hypotension.  She was placed on BIPAP, but required a secure airway for transport.  Pt. Evaluated, but unable to give a history.  Family denies history of anesthetic complications or dentures.  Smooth IV induction (Etomidate 20mg  and Succinylcholine 100mg ).  Pre-oxygenated with 100% O2 using BIPAP machine.  Glidescope #3 DL x 1.  Visualized ETT passing through cords.  +Et CO2.  BBSE with bilateral wheezes.  VSS.    Chest Xray ordered to confirm placement, care transferred to transport team.

## 2023-03-27 NOTE — Consult Note (Addendum)
Patient Demographics  Kaylee [name], is a 77 y.o. female   MRN: [id number]   DOB - Nov 10, 1945  Admit Date - 03/27/2023    Outpatient Primary MD for the patient is Ignatius Specking, MD  Consult requested in the Hospital by Leslye Peer, MD, On 03/27/2023    Reason for consult : Acute resp faulire/Hypotension   With History of -  Past Medical History:  Diagnosis Date   Arthritis    Asthma    Hypertension       Past Surgical History:  Procedure Laterality Date   APPENDECTOMY     KNEE ARTHROSCOPY      in for   Chief Complaint  Patient presents with   Shortness of Breath     HPI  Kaylee [name]  is a 77 y.o. female, past medical history of morbid obesity, chronic hypoxic respiratory failure, atrial fibrillation, on anticoagulation, chronic lymphedema, hypertension, history of DVT, and chronic lung disease, patient with recent hospitalization at [facility] in ED and for cellulitis and sacral pressure ulcer, she was discharged to [facility], patient was sent from facility due to significant dyspnea, hypoxia and increased work of breathing, increased somnolence, presentation is obtundent, cannot provide much history, her ABG significant for CO2 retention with pCO2 of 75, BNP elevated at 353, was notified from facility that she tested positive for COVID-19 2 days ago, was started on BiPAP, for which her mentation has improved and she became more talkative, patient blood pressure was noted initially to be soft, as well blood pressure readings were difficult to obtain given her body habitus, only successful attempts from the leg, Stop the reading with systolic blood pressure between 50s to 70s, which has improved after receiving 1 L bolus, most recent reading is 86/55, mentation fluctuates BiPAP, chest x-ray significant for volume overload, pleural effusions and possible  opacities, [facility] consulted to admit the patient to [facility], upon my evaluation patient seems to be better served at [facility] under ICU care, given her labile blood pressure, and respiratory status, and difficult intubation if she needs it.   Review of Systems     Patient status is fluctuating, but she answered most questions,  A full 10 point Review of Systems was done, except as stated above, all other Review of Systems were negative.    Social History Social History   Tobacco Use   Smoking status: Never   Smokeless tobacco: Not on file  Substance Use Topics   Alcohol use: No     Family History Family History  Problem Relation Age of Onset   Hepatitis Mother        C   Hypertension Mother    Coronary artery disease Father    Obesity Father    Hypertension Father        hypoglycemic     Prior to Admission medications   Medication Sig Start Date End Date Taking? Authorizing Provider  Amino Acids-Protein Hydrolys (FEEDING SUPPLEMENT,  PRO-STAT SUGAR FREE 64,) LIQD Take 30 mLs by mouth 2 (two) times daily.   Yes [provider]  buPROPion (WELLBUTRIN SR) 150 MG 12 hr tablet Take 150 mg by mouth 2 (two) times daily. 07/12/20  Yes [provider]  calcium-vitamin D (OSCAL WITH D) 500-5 MG-MCG tablet Take 1 tablet by mouth 2 (two) times daily.   Yes [provider]  carvedilol (COREG) 25 MG tablet Take 25 mg by mouth 2 (two) times daily with a meal.   Yes [provider]  cephALEXin (KEFLEX) 500 MG capsule Take 500 mg by mouth 2 (two) times daily.   Yes [provider]  diltiazem (CARDIZEM CD) 120 MG 24 hr capsule Take 1 capsule by mouth daily. 10/29/22  Yes [provider]  docusate sodium (COLACE) 100 MG capsule Take 100 mg by mouth daily.   Yes [provider]  ferrous sulfate 325 (65 FE) MG tablet Take 325 mg by mouth daily.   Yes [provider]  fluconazole (DIFLUCAN) 150  MG tablet Take 150 mg by mouth daily.   Yes [provider]  fluticasone-salmeterol (ADVAIR) 250-50 MCG/ACT AEPB Inhale 1 puff into the lungs in the morning and at bedtime.   Yes [provider]  hydrOXYzine (ATARAX) 25 MG tablet Take 25 mg by mouth 3 (three) times daily.   Yes [provider]  ipratropium-albuterol (DUONEB) 0.5-2.5 (3) MG/3ML SOLN Take 3 mLs by nebulization every 6 (six) hours as needed (wheezing, shortness of breath).   Yes [provider]  levothyroxine (SYNTHROID) 25 MCG tablet Take 25 mcg by mouth daily before breakfast. 01/04/22  Yes [provider]  losartan (COZAAR) 50 MG tablet Take 1 tablet by mouth daily.   Yes [provider]  Multiple Vitamin (MULTIVITAMIN) tablet Take 1 tablet by mouth daily.   Yes [provider]  ondansetron (ZOFRAN) 40 MG/20ML SOLN injection Inject 2 mLs into the vein every 6 (six) hours as needed for nausea or vomiting.   Yes [provider]  OXYGEN Inhale 2 L into the lungs continuous.   Yes [provider]  polyethylene glycol (MIRALAX / GLYCOLAX) 17 g packet Take 17 g by mouth daily.   Yes [provider]  Semaglutide-Weight Management (WEGOVY) 0.25 MG/0.5ML SOAJ Inject 0.25 mg into the skin every Monday.   Yes [provider]  traMADol (ULTRAM) 50 MG tablet Take 1 tablet (50 mg total) by mouth every 6 (six) hours as needed. Patient taking differently: Take 50 mg by mouth 2 (two) times daily. 07/21/13  Yes Vickki Hearing, MD  Vitamins A & D (VITAMIN A & D) ointment Apply 1 Application topically every Monday, Wednesday, and Friday. Apply topically to buttocks every Monday, Wednesday and Friday.   Yes [provider]  albuterol (PROVENTIL HFA;VENTOLIN HFA) 108 (90 BASE) MCG/ACT inhaler Inhale 2 puffs into the lungs every 4 (four) hours as needed for wheezing or shortness of breath. Shortness of breath Patient not taking: Reported on  03/27/2023 05/11/12   Christiane Ha, MD  dextromethorphan-guaiFENesin [facility] DM) 30-600 MG per 12 hr tablet Take 1 tablet by mouth 2 (two) times daily. For cough Patient not taking: Reported on 03/27/2023 05/11/12   Christiane Ha, MD  rivaroxaban (XARELTO) 20 MG TABS tablet Take 20 mg by mouth daily with supper. Patient not taking: Reported on 03/27/2023    [provider]    Anti-infectives (From admission, onward)    Start  Dose/Rate Route Frequency Ordered Stop   03/27/23 1800  azithromycin (ZITHROMAX) 500 mg in sodium chloride 0.9 % 250 mL IVPB        500 mg 250 mL/hr over 60 Minutes Intravenous Every 24 hours 03/27/23 1628     03/27/23 1700  cefTRIAXone (ROCEPHIN) 2 g in sodium chloride 0.9 % 100 mL IVPB        2 g 200 mL/hr over 30 Minutes Intravenous Every 24 hours 03/27/23 1628         Scheduled Meds:  midazolam  2 mg Intravenous Once   Continuous Infusions:  azithromycin     cefTRIAXone (ROCEPHIN)  IV     PRN Meds:.  Allergies  Allergen Reactions   Tetanus Toxoid     REACTION: swelling    Physical Exam  Vitals  Blood pressure (!) 112/46, pulse 83, temperature (!) 97.5 F (36.4 C), temperature source Oral, resp. rate 20, height 5\' 4"  (1.626 m), weight (!) 171.8 kg, SpO2 96%.   1. General Morbidly obese female, laying in bed,  2.  She is allergic but open her eyes to loud verbal stimuli, answer questions closes her eyes.  3. No F.N deficits, ALL C.Nerves Intact, Strength 5/5 all 4 extremities, Sensation intact all 4 extremities, Plantars down going.  4. Ears and Eyes appear Normal, Conjunctivae clear, PERRLA. Moist Oral Mucosa.  5. Supple Neck, difficult to evaluate for JVD given body habitus  6.  Diminished throughout both lungs given body habitus  7. RRR, No Gallops, Rubs or Murmurs, No Parasternal Heave.  8. Positive Bowel Sounds, Abdomen Soft, No tenderness, No organomegaly appriciated,No rebound -guarding or rigidity.  9.   No Cyanosis, Normal Skin Turgor, No Skin Rash or Bruise.  Having significant lower extremity lymphedema, as well upper extremity edema  10. Good muscle tone,  joints appear normal , no effusions,    Data Review  CBC Recent Labs  Lab 03/27/23 1305  WBC 7.1  HGB 9.4*  HCT 33.4*  PLT 191  MCV 95.7  MCH 26.9  MCHC 28.1*  RDW 15.8*  LYMPHSABS 1.4  MONOABS 0.6  EOSABS 0.1  BASOSABS 0.0   ------------------------------------------------------------------------------------------------------------------  Chemistries  Recent Labs  Lab 03/27/23 1305  NA 142  K 4.3  CL 104  CO2 33*  GLUCOSE 97  BUN 29*  CREATININE 1.27*  CALCIUM 8.6*  AST 27  ALT 30  ALKPHOS 83  BILITOT 0.8   ------------------------------------------------------------------------------------------------------------------ estimated creatinine clearance is 61.3 mL/min (A) (by C-G formula based on SCr of 1.27 mg/dL (H)). ------------------------------------------------------------------------------------------------------------------ No results for input(s): "TSH", "T4TOTAL", "T3FREE", "THYROIDAB" in the last 72 hours.  Invalid input(s): "FREET3"   Coagulation profile No results for input(s): "INR", "PROTIME" in the last 168 hours. ------------------------------------------------------------------------------------------------------------------- No results for input(s): "DDIMER" in the last 72 hours. -------------------------------------------------------------------------------------------------------------------  Cardiac Enzymes No results for input(s): "CKMB", "TROPONINI", "MYOGLOBIN" in the last 168 hours.  Invalid input(s): "CK" ------------------------------------------------------------------------------------------------------------------ Invalid input(s):  "POCBNP"   ---------------------------------------------------------------------------------------------------------------  Urinalysis No results found for: "COLORURINE", "APPEARANCEUR", "LABSPEC", "PHURINE", "GLUCOSEU", "HGBUR", "BILIRUBINUR", "KETONESUR", "PROTEINUR", "UROBILINOGEN", "NITRITE", "LEUKOCYTESUR"   Imaging results:   DG Chest Port 1 View  Result Date: 03/27/2023 CLINICAL DATA:  Hypoxia.  Recent positive COVID test. EXAM: PORTABLE CHEST 1 VIEW COMPARISON:  Chest radiograph dated February 07, 2023. FINDINGS: Patient is rotated to the right. Small layering bilateral pleural effusions with associated basilar atelectasis. These findings are new since the prior exam. The cardiac silhouette is obscured. No pneumothorax. No acute osseous abnormality. IMPRESSION: Small  layering bilateral pleural effusions with associated basilar atelectasis. Underlying infiltrate can not be excluded. Electronically Signed   By: Hart Robinsons M.D.   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 -Started empirically on IV Rocephin and azithromycin, her procalcitonin is reassuring , can consider to discontinue if improving with diuresis -Eaves 1 dose of IV Solu-Medrol. -Patient is with COVID-19 infection, ideally she would benefit from Actemra,  I have discussed with the pharmacy, currently no Actemra dose available at [facility], but if it can be brought from [facility] if needed, so we will hold on that for now as patient is being transferred to [facility], and she can receive it there if  felt needed by PCCM team . -Likely will benefit from bilevel support for undiagnosed obesity hypoventilatory syndrome. -Patient is accepted for admission at [facility] ICU. Marland Kitchen Hypotension -Lactic acid is reassuring, she is difficult to obtain accurate blood pressure and body habitus, but readings are persistently on the lower side, may need A-line for more accurate readings.   Addendum: -Patient blood pressure continues to drop, so she was started on peripheral pressors, as well she became more obtundent, less responsive even to painful stimuli, unable to protect her airway, decision has been made to intubate the patient prior to transport given she is difficult airway, high risk for decompensation during transport,given she is high risk intubation anesthesia has been called to intubate, I have called her husband and updated him about patient will be intubated, He reports he will be about to visit her in the morning at [facility], ICU    AM Labs Ordered, also please review Full Orders  Family Communication: I have informed her spouse that she is being transferred to [facility], ICU.   Thank you for the consult, we will follow the patient with you in the Hospital.   Huey Bienenstock M.D on 03/27/2023 at 4:32 PM    Thank you for the consult, we will follow the patient with you in the Hospital.   [facility]   Office  [phone]

## 2023-03-27 NOTE — Code Documentation (Addendum)
Tme out performed, intubation with anesthesia and [facility] at bedside

## 2023-03-27 NOTE — H&P (Signed)
NAME:  Kaylee [name], MRN:  [id number], DOB:  [birth year], LOS: 0 ADMISSION DATE:  03/27/2023, CONSULTATION DATE:  03/27/2023 REFERRING MD:  Elgergawy - TRH, CHIEF COMPLAINT: SOB  History of Present Illness:  77 year old woman who presented to [facility] ED 11/20 from [facility] with shortness of breath and increased oxygen requirement. PMHx significant for HTN, Afib (on Xarelto), HFpEF (Echo 09/2020 with EF 65-70%, normal LV/RV function, moderately dilated LA/RA), OSA/?OHS on 2L HOT, asthma, obesity. Recent admission to [facility] 10/3-10/9 for sepsis 2/2 cellulitis of RLE complicated by AKI; patient was discharged to [facility] to complete meropenem course (Lasix stopped at d/c).  History is obtained from chart review. Per EDP, "states that for the past 2.5 months has been feeling weak." Reportedly had positive COVID test 2 days prior to arrival with increasing O2 needs at facility prior to presentation. Denied chest pain, swelling. Labs were notable for WBC 7.1, Hgb 9.4, BUN 29, Cr 1.27, LA 1.4, VBG pH 7.3/pCO2 75, BNP 353. RVP panel + COVID and blood cultures pending. CXR demonstrated edema. On exam she was hypotensive, wheezing. Received Solumedrol, Duonebs, given 1L IVF total for ongoing hypotension. She was placed on BiPAP for ongoing work of breathing. She was initially admitted to hospitalist at [facility] who consulted PCCM for transfer and admission.   Prior to transfer, respiratory status continued to deteriorate prompting intubation.  PCCM consulted for ICU admission.  Pertinent Medical History:  Atrial fibrillation on Xarelto, CHF, obesity, asthma, obstructive sleep apnea on 2L [place] chronically  Significant Hospital Events: Including procedures, antibiotic start and stop dates in addition to other pertinent events   11/20 - Admit to PCCM, intubated at [facility] prior to transport to [facility]  Interim History / Subjective:  PCCM consulted for ICU admission and transfer.  Objective   Blood  pressure 114/73, pulse 83, temperature 98.5 F (36.9 C), temperature source Axillary, resp. rate (!) 3, height 5\' 4"  (1.626 m), weight (!) 168.4 kg, SpO2 94%.    Vent Mode: PRVC FiO2 (%):  [40 %-100 %] 100 % Set Rate:  [16 bmp] 16 bmp Vt Set:  [430 mL] 430 mL PEEP:  [8 cmH20] 8 cmH20 Plateau Pressure:  [27 cmH20] 27 cmH20   Intake/Output Summary (Last 24 hours) at 03/27/2023 2118 Last data filed at 03/27/2023 1800 Gross per 24 hour  Intake 1100 ml  Output --  Net 1100 ml   Filed Weights   03/27/23 1146 03/27/23 2059  Weight: (!) 171.8 kg (!) 168.4 kg   Physical Examination: General: Acute-on-chronically ill-appearing older woman in NAD. Intubated, sedated. HEENT: [place]/AT, anicteric sclera, PERRL 3mm, moist mucous membranes. Neuro: Sedated. Does not respond to verbal, tactile or noxious stimuli. Not following commands. NO spontaneous movement of extremities noted on exam. +Corneal, +Cough, and +Gag  CV: Irregularly irregular rhythm, no m/g/r. PULM: Breathing even and unlabored on vent (PEEP 8, FiO2 100%). Lung fields distant, coarse. GI: Obese, soft, nontender, nondistended. Hypoactive bowel sounds. +Umbilical hernia, reducible. Extremities: Bilateral symmetric 2+ pitting LE edema noted to bilateral thighs. Bilateral 2+ symmetric nonpitting UE edema. Skin: Warm/dry, no rashes. Scattered ecchymosis.  Resolved Hospital Problem List:     Assessment & Plan:  Acute-on-chronic respiratory failure with hypercarbia/hypoxia COVID-19 pneumonia  OSA/OHS Recent diagnosis of COVID-19, now with hypercarbic and hypoxic respiratory failure. Likely multifactorial respiratory failure with current viral infection in setting of asthma, OSA/OHS, obesity and possible chronic cor pulmonale related to aforementioned. There is mention in her chart of CHF, but do not  see formal diagnosis. She was on Lasix, which was discontinued in October. VBG pH 7.3, pCO2 75, BNP 353. PCT 0.10.  - Admit to ICU -  Continue full vent support (4-8cc/kg IBW, lung protective ventilation) - Maintain Pplat < 30, driving pressures <40   - Target PaO2 55-65, titrate PEEP/FiO2 per protocol  - Daily WUA/SBT as appropriate from a mental status standpoint - Goal CVP < 4, diuresis as necessary - VAP bundle - Pulmonary hygiene - PAD protocol for sedation: Propofol and Fentanyl for goal RASS 0 to -1 - Solumedrol 80mg  BID  - Remdesivir x 3 days - Continue ceftriaxone/azithromycin for now  Hypotension History of HTN Received 1L IVF resuscitation in ED. Lactic is negative. Procal 0.10. Unclear if related to body habitus. Home meds include Coreg, losartan. - Goal MAP > 65 - Fluid resuscitation as tolerated, caution in the setting of ?CHF history - Peripheral Levophed titrated to goal MAP, max 20 - Trend WBC, fever curve - F/u Cx data - Continue antibiotics as above - Cardiac monitoring/tele - Hold home antihypertensives for now  Atrial Fibrillation, permanent;on diltiazem, coreg, Xarelto Paroxysmal Afib CHA2DS2-VASc Score 4-5, dependent on +/- CHF history. - Cardiac monitoring - Optimize electrolytes for K > 4, Mg > 2 - Heparin gtt for now for [facility] in the setting of acute illness - F/u Echo - Hold dilt, Coreg in setting of hypotension  Morbid obesity - Counsel on weight loss, lifestyle modifications - Once discharged, can restart on Wegovy (in med rec)  Best Practice (right click and "Reselect all SmartList Selections" daily)   Diet/type: NPO DVT prophylaxis: SCDs, therapeutic AC for AFib GI prophylaxis: PPI Lines: N/A Foley:  N/A Code Status:  full code Last date of multidisciplinary goals of care discussion [Pending]  Labs   CBC: Recent Labs  Lab 03/27/23 1305  WBC 7.1  NEUTROABS 5.0  HGB 9.4*  HCT 33.4*  MCV 95.7  PLT 191   Basic Metabolic Panel: Recent Labs  Lab 03/27/23 1305  NA 142  K 4.3  CL 104  CO2 33*  GLUCOSE 97  BUN 29*  CREATININE 1.27*  CALCIUM 8.6*    GFR: Estimated Creatinine Clearance: 60.5 mL/min (A) (by C-G formula based on SCr of 1.27 mg/dL (H)). Recent Labs  Lab 03/27/23 1305  PROCALCITON 0.10  WBC 7.1  LATICACIDVEN 1.4   Liver Function Tests: Recent Labs  Lab 03/27/23 1305  AST 27  ALT 30  ALKPHOS 83  BILITOT 0.8  PROT 7.0  ALBUMIN 2.7*   No results for input(s): "LIPASE", "AMYLASE" in the last 168 hours. No results for input(s): "AMMONIA" in the last 168 hours.  ABG:    Component Value Date/Time   HCO3 36.5 (H) 03/27/2023 1613   O2SAT 41.9 03/27/2023 1613    Coagulation Profile: No results for input(s): "INR", "PROTIME" in the last 168 hours.  Cardiac Enzymes: No results for input(s): "CKTOTAL", "CKMB", "CKMBINDEX", "TROPONINI" in the last 168 hours.  HbA1C: No results found for: "HGBA1C"  CBG: Recent Labs  Lab 03/27/23 1758 03/27/23 2051  GLUCAP 92 131*   Review of Systems:   Patient is encephalopathic and/or intubated; therefore, history has been obtained from chart review.   Past Medical History:  She,  has a past medical history of Arthritis, Asthma, and Hypertension.   Surgical History:   Past Surgical History:  Procedure Laterality Date   APPENDECTOMY     KNEE ARTHROSCOPY      Social History:   reports that she  has never smoked. She does not have any smokeless tobacco history on file. She reports that she does not drink alcohol and does not use drugs.   Family History:  Her family history includes Coronary artery disease in her father; Hepatitis in her mother; Hypertension in her father and mother; Obesity in her father.   Allergies: Allergies  Allergen Reactions   Tetanus Toxoid     REACTION: swelling    Home Medications: Prior to Admission medications   Medication Sig Start Date End Date Taking? Authorizing Provider  Amino Acids-Protein Hydrolys (FEEDING SUPPLEMENT, PRO-STAT SUGAR FREE 64,) LIQD Take 30 mLs by mouth 2 (two) times daily.   Yes [provider]   buPROPion (WELLBUTRIN SR) 150 MG 12 hr tablet Take 150 mg by mouth 2 (two) times daily. 07/12/20  Yes [provider]  calcium-vitamin D (OSCAL WITH D) 500-5 MG-MCG tablet Take 1 tablet by mouth 2 (two) times daily.   Yes [provider]  carvedilol (COREG) 25 MG tablet Take 25 mg by mouth 2 (two) times daily with a meal.   Yes [provider]  cephALEXin (KEFLEX) 500 MG capsule Take 500 mg by mouth 2 (two) times daily.   Yes [provider]  diltiazem (CARDIZEM CD) 120 MG 24 hr capsule Take 1 capsule by mouth daily. 10/29/22  Yes [provider]  docusate sodium (COLACE) 100 MG capsule Take 100 mg by mouth daily.   Yes [provider]  ferrous sulfate 325 (65 FE) MG tablet Take 325 mg by mouth daily.   Yes [provider]  fluconazole (DIFLUCAN) 150 MG tablet Take 150 mg by mouth daily.   Yes [provider]  fluticasone-salmeterol (ADVAIR) 250-50 MCG/ACT AEPB Inhale 1 puff into the lungs in the morning and at bedtime.   Yes [provider]  hydrOXYzine (ATARAX) 25 MG tablet Take 25 mg by mouth 3 (three) times daily.   Yes [provider]  ipratropium-albuterol (DUONEB) 0.5-2.5 (3) MG/3ML SOLN Take 3 mLs by nebulization every 6 (six) hours as needed (wheezing, shortness of breath).   Yes [provider]  levothyroxine (SYNTHROID) 25 MCG tablet Take 25 mcg by mouth daily before breakfast. 01/04/22  Yes [provider]  losartan (COZAAR) 50 MG tablet Take 1 tablet by mouth daily.   Yes [provider]  Multiple Vitamin (MULTIVITAMIN) tablet Take 1 tablet by mouth daily.   Yes [provider]  ondansetron (ZOFRAN) 40 MG/20ML SOLN injection Inject 2 mLs into the vein every 6 (six) hours as needed for nausea or vomiting.   Yes [provider]  OXYGEN Inhale 2 L into the lungs continuous.   Yes [provider]  polyethylene glycol (MIRALAX / GLYCOLAX) 17 g packet  Take 17 g by mouth daily.   Yes [provider]  Semaglutide-Weight Management (WEGOVY) 0.25 MG/0.5ML SOAJ Inject 0.25 mg into the skin every Monday.   Yes [provider]  traMADol (ULTRAM) 50 MG tablet Take 1 tablet (50 mg total) by mouth every 6 (six) hours as needed. Patient taking differently: Take 50 mg by mouth 2 (two) times daily. 07/21/13  Yes Vickki Hearing, MD  Vitamins A & D (VITAMIN A & D) ointment Apply 1 Application topically every Monday, Wednesday, and Friday. Apply topically to buttocks every Monday, Wednesday and Friday.   Yes [provider]  albuterol (PROVENTIL HFA;VENTOLIN HFA) 108 (90 BASE) MCG/ACT inhaler Inhale 2 puffs into the lungs every 4 (four) hours as needed  for wheezing or shortness of breath. Shortness of breath Patient not taking: Reported on 03/27/2023 05/11/12   Christiane Ha, MD  dextromethorphan-guaiFENesin [facility] DM) 30-600 MG per 12 hr tablet Take 1 tablet by mouth 2 (two) times daily. For cough Patient not taking: Reported on 03/27/2023 05/11/12   Christiane Ha, MD  rivaroxaban (XARELTO) 20 MG TABS tablet Take 20 mg by mouth daily with supper. Patient not taking: Reported on 03/27/2023    [provider]    Critical care time:    The patient is critically ill with multiple organ system failure and requires high complexity decision making for assessment and support, frequent evaluation and titration of therapies, advanced monitoring, review of radiographic studies and interpretation of complex data.   Critical Care Time devoted to patient care services, exclusive of separately billable procedures, described in this note is 43 minutes.  Tim Lair, PA-C [facility] 03/27/23 9:18 PM  Please see [email or web address] for pager details.  From 7A-7P if no response, please call [phone] After hours, please call ELink [phone]

## 2023-03-27 NOTE — ED Triage Notes (Signed)
Pt brought by [facility] from [place]. EMS reported "Pt has been previously on O2, but continued to have lower saturation, so increased 02 to 6 and called EMS. Pt was recently tested and positive for COVID."

## 2023-03-27 NOTE — Progress Notes (Signed)
RT called to patient room for [facility] arrival to transport patient. Upon entering room RT was told prior to transport patient would be intubated and anesthesia had been called. We are waiting on their arrival. RT was also told patient BiPAP setting had been increased by [facility]link from 10/5 to 15/5.

## 2023-03-27 NOTE — Progress Notes (Signed)
Patient was on Bipap , waiting for Anesthesia to arrive to intubate patient.  [facility] was standing by to transport patient after intubation to [facility].  No invasive settings put in by this RT, [facility] RT, Amy, was made aware of this and circumstances.  Gave [facility] vent settings to Amy, RRT at [facility] at time of report.  [facility] is preparing patient for transport at this time.

## 2023-03-27 NOTE — Progress Notes (Signed)
eLink Physician-Brief Progress Note Patient Name: Kaylee [name] DOB: [birth year] MRN: [id number]   Date of Service  03/27/2023  HPI/Events of Note  77 year old female with a history of morbid obesity, chronic respiratory failure, atrial fibrillation on anticoagulation, history of DVTs who initially presented after recent hospitalization at [facility] for cellulitis and sacral pressure ulcer with significant dyspnea, hypoxemia and increased work of breathing found to have acute hypercapnic hypoxic respiratory failure requiring mechanical ventilation in the setting of COVID.  Transferred to [facility] for further management.  Presents with significant agitation but normal vitals, saturating 92% on a PEEP of 8 and 100% FiO2.  Stable ventilatory mechanics.  Metabolic panel consistent with elevated creatinine (lower than previous in 2014) minimally elevated BNP/ lactic acid, chronic normocytic anemia.  Blood cultures collected.  COVID-positive.  Radiograph with bilateral hypervascularization and fluid overload, likely bilateral infiltrates.  eICU Interventions  Stat ABG and chest radiograph to verify tube positioning.  Added propofol infusion, intermittent fentanyl  Ground team evaluation pending.  Restraints as needed for patient's safety  Norepinephrine as needed to maintain MAP greater than 65  DVT prophylaxis and GI prophylaxis pending.  Will review again shortly after patient settled in.     Intervention Category Major Interventions: Delirium, psychosis, severe agitation - evaluation and management Evaluation Type: New Patient Evaluation  Mischelle Reeg 03/27/2023, 8:56 PM

## 2023-03-27 NOTE — ED Notes (Signed)
Pt transferred to [facility] via [facility] 

## 2023-03-27 NOTE — Procedures (Signed)
Arterial Catheter Insertion Procedure Note  SHAIANNE GROSSBERG  [id number]  1945/10/19  Date:03/27/23  Time:11:39 PM    Provider Performing: Tim Lair    Procedure: Insertion of Arterial Line (53664) with US guidance (40347)   Indication(s): Blood pressure monitoring and/or need for frequent ABGs  Consent: Risks of the procedure as well as the alternatives and risks of each were explained to the patient and/or caregiver.  Consent for the procedure was obtained verbally from patient's husband.  Anesthesia: None  Time Out: Verified patient identification, verified procedure, site/side was marked, verified correct patient position, special equipment/implants available, medications/allergies/relevant history reviewed, required imaging and test results available.  Sterile Technique: Maximal sterile technique including full sterile barrier drape, hand hygiene, sterile gown, sterile gloves, mask, hair covering, sterile ultrasound probe cover (if used).  Procedure Description: Area of catheter insertion was cleaned with chlorhexidine and draped in sterile fashion. With real-time ultrasound guidance an arterial catheter was placed into the left radial artery.  Appropriate arterial tracings confirmed on monitor.    Complications/Tolerance: None; patient tolerated the procedure well.  EBL: Minimal  Specimen(s): None  Tim Lair, [place] [facility] 03/27/23 11:39 PM  Please see [email or web address] for pager details.  From 7A-7P if no response, please call [phone] After hours, please call ELink [phone]

## 2023-03-27 NOTE — ED Notes (Signed)
Report given to 3M 

## 2023-03-27 NOTE — Progress Notes (Incomplete)
PHARMACY - ANTICOAGULATION CONSULT NOTE  Pharmacy Consult for heparin Indication: atrial fibrillation  Allergies  Allergen Reactions   Tetanus Toxoid     REACTION: swelling    Patient Measurements: Height: 5\' 4"  (162.6 cm) Weight: (!) 168.4 kg (371 lb 4.1 oz) IBW/kg (Calculated) : 54.7 Heparin Dosing Weight: 98.4 kg  Vital Signs: Temp: 98.7 F (37.1 C) (11/20 2134) Temp Source: Oral (11/20 2134) BP: 82/54 (11/20 2130) Pulse Rate: 84 (11/20 2134)  Labs: Recent Labs    03/27/23 1305  HGB 9.4*  HCT 33.4*  PLT 191  CREATININE 1.27*    Estimated Creatinine Clearance: 60.5 mL/min (A) (by C-G formula based on SCr of 1.27 mg/dL (H)).   Medical History: Past Medical History:  Diagnosis Date   Arthritis    Asthma    Blind    ESRD (end stage renal disease) on dialysis (HCC)    Hypertension    Assessment: 58 yoF presented with respiratory failure requiring mechanical ventilation and COVID +. Pharmacy consulted to dose heparin for atrial fibrillation. PMH includes: morbid obesity, atrial fibrillation (xarelto), history of DVTs, and recent hospitalization at [facility] for cellulitis.  Hgb 9.4, plts 191, sCr 1.91 Per Nursing home facility [facility], patient not taking Xarelto, baseline aPTT/heparin level ***, *** respectively  Goal of Therapy:  Heparin level 0.3-0.7 units/ml Monitor platelets by anticoagulation protocol: Yes   Plan:   Start heparin infusion at *** units/hr Check anti-Xa level in *** hours and daily while on heparin Continue to monitor H&H and platelets  Arabella Merles, PharmD. Clinical Pharmacist 03/27/2023 10:10 PM

## 2023-03-27 NOTE — ED Provider Notes (Signed)
Kaylee [name], Kaylee [name], Kaylee [name], Kaylee [name] to HPI:1} Chief Complaint  Patient presents with   Shortness of Breath    Kaylee [name] is a 77 y.o. female.  77 year old female with a [name] of [name] fibrillation on diltiazem and Xarelto, CHF typically on 2 L nasal cannula, obesity, and asthma who presents to the emergency department with increased oxygen requirement.  Patient is poor historian but says that for the past 2 and half months she has been feeling weak.  Said that she had a COVID test 2 days ago that was positive.  Has been increasing her oxygen requirement at her facility [name]) and brought her into the emergency department.  She denies any increased swelling or chest pain.  Tried to reach [name] for additional information but they were not available.       Home Medications Prior to Admission medications   Medication Sig Start Date End Date Taking? Authorizing Provider  acetaminophen (TYLENOL) 325 MG tablet Take 2 tablets (650 mg total) by mouth every 6 (six) hours as needed (or Fever >/= 101). 05/11/12   Christiane Ha, MD  albuterol (PROVENTIL HFA;VENTOLIN HFA) 108 (90 BASE) MCG/ACT inhaler Inhale 2 puffs into the lungs every 4 (four) hours as needed for wheezing or shortness of breath. Shortness of breath 05/11/12   Christiane Ha, MD  CARVEDILOL PO Take by mouth.    [provider]  dextromethorphan-guaiFENesin (MUCINEX DM) 30-600 MG per 12 hr tablet Take 1 tablet by mouth 2 (two) times daily. For cough 05/11/12   Christiane Ha, MD  furosemide (LASIX) 40 MG tablet Take 40 mg by mouth as needed. Fluid retention    [provider]  Ibuprofen 200 MG CAPS Take by mouth.    [provider]  Mometasone Furo-Formoterol Fum (DULERA IN) Inhale 1 puff into the lungs 2  (two) times daily.     [provider]  potassium chloride (KLOR-CON) 10 MEQ CR tablet Take 10 mEq by mouth as needed. Take with fluid pill    [provider]  traMADol (ULTRAM) 50 MG tablet Take 1 tablet (50 mg total) by mouth every 6 (six) hours as needed. 07/21/13   Vickki Hearing, MD      Allergies    Tetanus toxoid    Review of Systems   Review of Systems  Physical Exam Updated Vital Signs BP 100/64 (BP Location: Left Wrist)   Pulse 74   Temp (!) 97.5 F (36.4 C) (Oral)   Resp 20   Ht 5\' 4"  (1.626 m)   Wt (!) 171.8 kg   SpO2 93%   BMI 65.01 kg/m  Physical Exam Vitals and nursing note reviewed.  Constitutional:      General: She is not in acute distress.    Appearance: She is well-developed. She is obese.  HENT:     Head: Normocephalic and atraumatic.     Right Ear: External ear normal.     Left Ear: External ear normal.     Nose: Nose normal.  Eyes:     Extraocular Movements: Extraocular movements intact.     Conjunctiva/sclera: Conjunctivae normal.     Pupils: Pupils are equal, round, and reactive to light.  Cardiovascular:     Rate and Rhythm: Normal rate and regular rhythm.     Heart  sounds: No murmur heard. Pulmonary:     Effort: Pulmonary effort is normal. No respiratory distress.     Breath sounds: Wheezing and rhonchi present.     Comments: On 6 L nasal cannula but weaned down to 4 L.  Habitus limits evaluation but does have some expiratory wheezing and persistent coughing. Musculoskeletal:     Cervical back: Normal range of motion and neck supple.     Right lower leg: No edema.     Left lower leg: No edema.  Skin:    General: Skin is warm and dry.  Neurological:     Mental Status: She is alert and oriented to person, place, and time. Mental status is at baseline.  Psychiatric:        Mood and Affect: Mood normal.     ED Results / Procedures / Treatments   Labs (all labs ordered are listed, but only abnormal results are  displayed) Labs Reviewed - No data to display  EKG None  Radiology No results found.  Procedures Procedures  {Document cardiac monitor, telemetry assessment procedure when appropriate:1}  Medications Ordered in ED Medications - No data to display  ED Course/ [name] Decision Making/ A&P   {   Click here for ABCD2, HEART and other calculatorsREFRESH Note before signing :1}                              [name] Decision Making Amount and/or Complexity of Data Reviewed Labs: ordered. Radiology: ordered. ECG/medicine tests: ordered.  Risk Prescription drug management.   ***  {Document critical care time when appropriate:1} {Document review of labs and clinical decision tools ie heart score, Chads2Vasc2 etc:1}  {Document your independent review of radiology images, and any outside records:1} {Document your discussion with family members, caretakers, and with consultants:1} {Document Kaylee determinants of health affecting pt's care:1} {Document your decision making why or why not admission, treatments were needed:1} Final Clinical Impression(s) / ED Diagnoses Final diagnoses:  None    Rx / DC Orders ED Discharge Orders     None

## 2023-03-28 ENCOUNTER — Inpatient Hospital Stay (HOSPITAL_COMMUNITY): Payer: Medicare Other

## 2023-03-28 DIAGNOSIS — J80 Acute respiratory distress syndrome: Secondary | ICD-10-CM

## 2023-03-28 DIAGNOSIS — U071 COVID-19: Secondary | ICD-10-CM | POA: Diagnosis not present

## 2023-03-28 DIAGNOSIS — I4821 Permanent atrial fibrillation: Secondary | ICD-10-CM | POA: Diagnosis not present

## 2023-03-28 DIAGNOSIS — N1831 Chronic kidney disease, stage 3a: Secondary | ICD-10-CM

## 2023-03-28 DIAGNOSIS — I959 Hypotension, unspecified: Secondary | ICD-10-CM | POA: Diagnosis not present

## 2023-03-28 LAB — POCT I-STAT 7, (LYTES, BLD GAS, ICA,H+H)
Acid-Base Excess: 2 mmol/L (ref 0.0–2.0)
Acid-Base Excess: 4 mmol/L — ABNORMAL HIGH (ref 0.0–2.0)
Acid-Base Excess: 5 mmol/L — ABNORMAL HIGH (ref 0.0–2.0)
Acid-Base Excess: 4 mmol/L — ABNORMAL HIGH (ref 0.0–2.0)
Acid-Base Excess: 7 mmol/L — ABNORMAL HIGH (ref 0.0–2.0)
Bicarbonate: 29.1 mmol/L — ABNORMAL HIGH (ref 20.0–28.0)
Bicarbonate: 30.4 mmol/L — ABNORMAL HIGH (ref 20.0–28.0)
Bicarbonate: 30.3 mmol/L — ABNORMAL HIGH (ref 20.0–28.0)
Bicarbonate: 31.2 mmol/L — ABNORMAL HIGH (ref 20.0–28.0)
Bicarbonate: 31.9 mmol/L — ABNORMAL HIGH (ref 20.0–28.0)
Calcium, Ion: 1.21 mmol/L (ref 1.15–1.40)
Calcium, Ion: 1.19 mmol/L (ref 1.15–1.40)
Calcium, Ion: 1.14 mmol/L — ABNORMAL LOW (ref 1.15–1.40)
Calcium, Ion: 1.16 mmol/L (ref 1.15–1.40)
Calcium, Ion: 1.15 mmol/L (ref 1.15–1.40)
HCT: 30 % — ABNORMAL LOW (ref 36.0–46.0)
HCT: 26 % — ABNORMAL LOW (ref 36.0–46.0)
HCT: 29 % — ABNORMAL LOW (ref 36.0–46.0)
HCT: 29 % — ABNORMAL LOW (ref 36.0–46.0)
HCT: 30 % — ABNORMAL LOW (ref 36.0–46.0)
Hemoglobin: 10.2 g/dL — ABNORMAL LOW (ref 12.0–15.0)
Hemoglobin: 8.8 g/dL — ABNORMAL LOW (ref 12.0–15.0)
Hemoglobin: 9.9 g/dL — ABNORMAL LOW (ref 12.0–15.0)
Hemoglobin: 9.9 g/dL — ABNORMAL LOW (ref 12.0–15.0)
Hemoglobin: 10.2 g/dL — ABNORMAL LOW (ref 12.0–15.0)
O2 Saturation: 97 %
O2 Saturation: 96 %
O2 Saturation: 99 %
O2 Saturation: 95 %
O2 Saturation: 96 %
Patient temperature: 97.9
Patient temperature: 37.2
Patient temperature: 36.6
Patient temperature: 36.6
Patient temperature: 98
Potassium: 4.5 mmol/L (ref 3.5–5.1)
Potassium: 4.1 mmol/L (ref 3.5–5.1)
Potassium: 3.8 mmol/L (ref 3.5–5.1)
Potassium: 3.7 mmol/L (ref 3.5–5.1)
Potassium: 3.6 mmol/L (ref 3.5–5.1)
Sodium: 146 mmol/L — ABNORMAL HIGH (ref 135–145)
Sodium: 144 mmol/L (ref 135–145)
Sodium: 144 mmol/L (ref 135–145)
Sodium: 146 mmol/L — ABNORMAL HIGH (ref 135–145)
Sodium: 144 mmol/L (ref 135–145)
TCO2: 31 mmol/L (ref 22–32)
TCO2: 32 mmol/L (ref 22–32)
TCO2: 32 mmol/L (ref 22–32)
TCO2: 33 mmol/L — ABNORMAL HIGH (ref 22–32)
TCO2: 33 mmol/L — ABNORMAL HIGH (ref 22–32)
pCO2 arterial: 57.5 mm[Hg] — ABNORMAL HIGH (ref 32–48)
pCO2 arterial: 52.7 mm[Hg] — ABNORMAL HIGH (ref 32–48)
pCO2 arterial: 44.5 mm[Hg] (ref 32–48)
pCO2 arterial: 57.6 mm[Hg] — ABNORMAL HIGH (ref 32–48)
pCO2 arterial: 45 mm[Hg] (ref 32–48)
pH, Arterial: 7.31 — ABNORMAL LOW (ref 7.35–7.45)
pH, Arterial: 7.369 (ref 7.35–7.45)
pH, Arterial: 7.44 (ref 7.35–7.45)
pH, Arterial: 7.34 — ABNORMAL LOW (ref 7.35–7.45)
pH, Arterial: 7.457 — ABNORMAL HIGH (ref 7.35–7.45)
pO2, Arterial: 106 mm[Hg] (ref 83–108)
pO2, Arterial: 90 mm[Hg] (ref 83–108)
pO2, Arterial: 156 mm[Hg] — ABNORMAL HIGH (ref 83–108)
pO2, Arterial: 83 mm[Hg] (ref 83–108)
pO2, Arterial: 75 mm[Hg] — ABNORMAL LOW (ref 83–108)

## 2023-03-28 LAB — HEMOGLOBIN A1C
Hgb A1c MFr Bld: 5.4 % (ref 4.8–5.6)
Mean Plasma Glucose: 108.28 mg/dL

## 2023-03-28 LAB — RESPIRATORY PANEL BY PCR

## 2023-03-28 LAB — CBC
HCT: 32.4 % — ABNORMAL LOW (ref 36.0–46.0)
Hemoglobin: 9.6 g/dL — ABNORMAL LOW (ref 12.0–15.0)
MCH: 27.3 pg (ref 26.0–34.0)
MCHC: 29.6 g/dL — ABNORMAL LOW (ref 30.0–36.0)
MCV: 92 fL (ref 80.0–100.0)
Platelets: 216 K/uL (ref 150–400)
RBC: 3.52 MIL/uL — ABNORMAL LOW (ref 3.87–5.11)
RDW: 15.4 % (ref 11.5–15.5)
WBC: 8.5 K/uL (ref 4.0–10.5)
nRBC: 0 % (ref 0.0–0.2)

## 2023-03-28 LAB — BASIC METABOLIC PANEL
Anion gap: 9 (ref 5–15)
BUN: 28 mg/dL — ABNORMAL HIGH (ref 8–23)
CO2: 27 mmol/L (ref 22–32)
Calcium: 8.4 mg/dL — ABNORMAL LOW (ref 8.9–10.3)
Chloride: 105 mmol/L (ref 98–111)
Creatinine, Ser: 1.27 mg/dL — ABNORMAL HIGH (ref 0.44–1.00)
GFR, Estimated: 44 mL/min — ABNORMAL LOW (ref >60)
Glucose, Bld: 167 mg/dL — ABNORMAL HIGH (ref 70–99)
Potassium: 4.7 mmol/L (ref 3.5–5.1)
Sodium: 141 mmol/L (ref 135–145)

## 2023-03-28 LAB — PHOSPHORUS: Phosphorus: 4 mg/dL (ref 2.5–4.6)

## 2023-03-28 LAB — APTT
aPTT: 28 s (ref 24–36)
aPTT: 84 s — ABNORMAL HIGH (ref 24–36)

## 2023-03-28 LAB — GLUCOSE, CAPILLARY
Glucose-Capillary: 143 mg/dL — ABNORMAL HIGH (ref 70–99)
Glucose-Capillary: 118 mg/dL — ABNORMAL HIGH (ref 70–99)
Glucose-Capillary: 125 mg/dL — ABNORMAL HIGH (ref 70–99)
Glucose-Capillary: 143 mg/dL — ABNORMAL HIGH (ref 70–99)
Glucose-Capillary: 134 mg/dL — ABNORMAL HIGH (ref 70–99)
Glucose-Capillary: 144 mg/dL — ABNORMAL HIGH (ref 70–99)

## 2023-03-28 LAB — HEPARIN LEVEL (UNFRACTIONATED): Heparin Unfractionated: >1.1 [IU]/mL — ABNORMAL HIGH (ref 0.30–0.70)

## 2023-03-28 LAB — MAGNESIUM: Magnesium: 2.2 mg/dL (ref 1.7–2.4)

## 2023-03-28 LAB — TRIGLYCERIDES: Triglycerides: 90 mg/dL (ref <150)

## 2023-03-28 MED ORDER — RENA-VITE PO TABS
1.0000 | ORAL_TABLET | Freq: Every day | ORAL | Status: DC
  Administered 2023-03-28 – 2023-04-04 (×8): 1
  Filled 2023-03-28 (×8): qty 1

## 2023-03-28 MED ORDER — FENTANYL CITRATE PF 50 MCG/ML IJ SOSY
25.0000 ug | PREFILLED_SYRINGE | Freq: Once | INTRAMUSCULAR | Status: AC
Start: 2023-03-28 — End: 2023-03-28

## 2023-03-28 MED ORDER — VANCOMYCIN HCL 10 G IV SOLR
2500.0000 mg | Freq: Once | INTRAVENOUS | Status: DC
  Administered 2023-03-29: 2500 mg via INTRAVENOUS
  Filled 2023-03-28: qty 2500

## 2023-03-28 MED ORDER — VANCOMYCIN HCL 1500 MG/300ML IV SOLN: 1500.0000 mg | INTRAVENOUS | Status: DC

## 2023-03-28 MED ORDER — FENTANYL 2500MCG IN NS 250ML (10MCG/ML) PREMIX INFUSION
25.0000 ug/h | INTRAVENOUS | Status: DC
  Administered 2023-03-28: 300 ug/h via INTRAVENOUS
  Administered 2023-03-28: 25 ug/h via INTRAVENOUS
  Administered 2023-03-29 (×3): 300 ug/h via INTRAVENOUS
  Administered 2023-03-30: 225 ug/h via INTRAVENOUS
  Administered 2023-03-30: 250 ug/h via INTRAVENOUS
  Administered 2023-03-31: 100 ug/h via INTRAVENOUS
  Administered 2023-03-31: 200 ug/h via INTRAVENOUS
  Administered 2023-04-01: 100 ug/h via INTRAVENOUS
  Administered 2023-04-02: 65 ug/h via INTRAVENOUS
  Administered 2023-04-03: 165 ug/h via INTRAVENOUS
  Administered 2023-04-03: 140 ug/h via INTRAVENOUS
  Administered 2023-04-04: 75 ug/h via INTRAVENOUS
  Filled 2023-03-28 (×14): qty 250

## 2023-03-28 MED ORDER — VITAL AF 1.2 CAL PO LIQD
1000.0000 mL | ORAL | Status: DC
  Administered 2023-03-28 – 2023-04-01 (×5): 1000 mL

## 2023-03-28 MED ORDER — FUROSEMIDE 10 MG/ML IJ SOLN
60.0000 mg | Freq: Three times a day (TID) | INTRAMUSCULAR | Status: AC
  Administered 2023-03-28 (×3): 60 mg via INTRAVENOUS
  Filled 2023-03-28 (×3): qty 6

## 2023-03-28 MED ORDER — LEVOTHYROXINE SODIUM 25 MCG PO TABS
25.0000 ug | ORAL_TABLET | Freq: Every day | ORAL | Status: DC
  Administered 2023-03-28 – 2023-04-05 (×9): 25 ug
  Filled 2023-03-28 (×9): qty 1

## 2023-03-28 MED ORDER — HEPARIN (PORCINE) 25000 UT/250ML-% IV SOLN
1350.0000 [IU]/h | INTRAVENOUS | Status: DC
  Administered 2023-03-28 – 2023-03-29 (×3): 1500 [IU]/h via INTRAVENOUS
  Administered 2023-03-29: 1350 [IU]/h via INTRAVENOUS
  Filled 2023-03-28 (×4): qty 250

## 2023-03-28 MED ORDER — FENTANYL BOLUS VIA INFUSION
25.0000 ug | INTRAVENOUS | Status: DC | PRN
  Administered 2023-03-31: 100 ug via INTRAVENOUS
  Administered 2023-03-31: 25 ug via INTRAVENOUS
  Administered 2023-04-01: 50 ug via INTRAVENOUS
  Administered 2023-04-01: 25 ug via INTRAVENOUS
  Administered 2023-04-01: 50 ug via INTRAVENOUS
  Administered 2023-04-02 (×4): 100 ug via INTRAVENOUS
  Administered 2023-04-02 (×2): 50 ug via INTRAVENOUS
  Administered 2023-04-02 – 2023-04-03 (×10): 100 ug via INTRAVENOUS

## 2023-03-28 MED ORDER — DEXAMETHASONE 4 MG PO TABS
6.0000 mg | ORAL_TABLET | Freq: Every day | ORAL | Status: DC
  Administered 2023-03-28 – 2023-03-30 (×3): 6 mg
  Filled 2023-03-28 (×3): qty 2

## 2023-03-28 MED ORDER — PROSOURCE TF20 ENFIT COMPATIBL EN LIQD
60.0000 mL | Freq: Three times a day (TID) | ENTERAL | Status: DC
  Administered 2023-03-28 – 2023-03-29 (×3): 60 mL
  Filled 2023-03-28 (×3): qty 60

## 2023-03-28 NOTE — Progress Notes (Signed)
RN X 3,  RT X 2  turned pt head to Left no complications

## 2023-03-28 NOTE — Progress Notes (Signed)
Patient's blood culture is growing gram-positive cocci in aerobic bottle.  Will add IV vancomycin until culture data is specification     Cheri Fowler, MD [facility] See Amion for pager If no response to pager, please call [phone] until 7pm After 7pm, Please call E-link [phone]

## 2023-03-28 NOTE — Progress Notes (Signed)
Pharmacy Antibiotic Note  Kaylee [name] is a 77 y.o. female admitted on 03/27/2023 with Covid pneumonia.  Pharmacy has been consulted for Vancomycin dosing.  Also receiving Ceftriaxone and Azithromycin  Plan: Vancomycin 2500 mg iv x 1 dose now then 1500 mg iv Q 24 hours (AUC of 510 using Scr 1.14 and Vd coefficient of 0.5)  Follow Scr, cultures, LOT  Height: 5\' 4"  (162.6 cm) Weight: (!) 168.4 kg (371 lb 4.1 oz) IBW/kg (Calculated) : 54.7  Temp (24hrs), Avg:98.3 F (36.8 C), Min:97.8 F (36.6 C), Max:98.9 F (37.2 C)  Recent Labs  Lab 03/27/23 1305 03/27/23 2352  WBC 7.1 8.5  CREATININE 1.27* 1.27*  LATICACIDVEN 1.4  --     Estimated Creatinine Clearance: 60.5 mL/min (A) (by C-G formula based on SCr of 1.27 mg/dL (H)).    Allergies  Allergen Reactions   Tetanus Toxoid     REACTION: swelling   Thank you for allowing pharmacy to be a part of this patient's care. Okey Regal, PharmD

## 2023-03-28 NOTE — Progress Notes (Signed)
2D echo attempted, but patient is prone. Will try later

## 2023-03-28 NOTE — Progress Notes (Signed)
Patients head turned with no complications noted, vitals stable. RT x 2 and RN at bedside.

## 2023-03-28 NOTE — Progress Notes (Signed)
Patients head turned with no complications, RT and RN x2 at bedside. RT able to pass suction catheter, vitals stable.

## 2023-03-28 NOTE — Progress Notes (Signed)
Patient proned per MD's order with RT x2, RN x3 and CNA at bedside. No complications noted, patient's vitals stable. ET suction catheter able to pass.

## 2023-03-28 NOTE — Progress Notes (Signed)
NAME:  Kaylee [name], MRN:  [id number], DOB:  [birth year], LOS: 1 ADMISSION DATE:  03/27/2023, CONSULTATION DATE:  03/27/2023 REFERRING MD:  Elgergawy - TRH, CHIEF COMPLAINT: SOB  History of Present Illness:  77 year old woman who presented to [facility] ED 11/20 from [facility] with shortness of breath and increased oxygen requirement. PMHx significant for HTN, Afib (on Xarelto), HFpEF (Echo 09/2020 with EF 65-70%, normal LV/RV function, moderately dilated LA/RA), OSA/?OHS on 2L HOT, asthma, obesity. Recent admission to [facility] 10/3-10/9 for sepsis 2/2 cellulitis of RLE complicated by AKI; patient was discharged to [facility] to complete meropenem course (Lasix stopped at d/c).  History is obtained from chart review. Per EDP, "states that for the past 2.5 months has been feeling weak." Reportedly had positive COVID test 2 days prior to arrival with increasing O2 needs at facility prior to presentation. Denied chest pain, swelling. Labs were notable for WBC 7.1, Hgb 9.4, BUN 29, Cr 1.27, LA 1.4, VBG pH 7.3/pCO2 75, BNP 353. RVP panel + COVID and blood cultures pending. CXR demonstrated edema. On exam she was hypotensive, wheezing. Received Solumedrol, Duonebs, given 1L IVF total for ongoing hypotension. She was placed on BiPAP for ongoing work of breathing. She was initially admitted to hospitalist at [facility] who consulted PCCM for transfer and admission.   Prior to transfer, respiratory status continued to deteriorate prompting intubation.  PCCM consulted for ICU admission.  Pertinent Medical History:  Atrial fibrillation on Xarelto, CHF, obesity, asthma, obstructive sleep apnea on 2L [place] chronically  Significant Hospital Events: Including procedures, antibiotic start and stop dates in addition to other pertinent events   11/20 - Admit to PCCM, intubated at [facility] prior to transport to [facility]  Interim History / Subjective:  Patient is intubated, sedated Currently on 100% FiO2 and PEEP of 12 Her  P/F ratio is less than 100 Afebrile Slightly hypotensive in the setting of high amount of sedation  Objective   Blood pressure (!) 90/49, pulse 80, temperature 98.9 F (37.2 C), temperature source Axillary, resp. rate (!) 28, height 5\' 4"  (1.626 m), weight (!) 168.4 kg, SpO2 100%.    Vent Mode: PRVC FiO2 (%):  [40 %-100 %] 100 % Set Rate:  [16 bmp-28 bmp] 28 bmp Vt Set:  [430 mL] 430 mL PEEP:  [8 cmH20-10 cmH20] 10 cmH20 Plateau Pressure:  [20 cmH20-27 cmH20] 23 cmH20   Intake/Output Summary (Last 24 hours) at 03/28/2023 0902 Last data filed at 03/28/2023 9629 Gross per 24 hour  Intake 2241.34 ml  Output 600 ml  Net 1641.34 ml   Filed Weights   03/27/23 1146 03/27/23 2059 03/27/23 2200  Weight: (!) 171.8 kg (!) 168.4 kg (!) 168.4 kg   Physical Examination: General: Crtitically ill-appearing morbidly obese female, orally intubated HEENT: [place]/AT, eyes anicteric.  ETT and OGT in place Neuro: Sedated, not following commands.  Eyes are closed.  Pupils 3 mm bilateral reactive to light Chest: Coarse breath sounds, no wheezes or rhonchi Heart: Irregularly irregular, no murmurs or gallops Abdomen: Soft, nondistended, bowel sounds present Skin: Erythema noted on right lower leg on medial side of thigh and lower leg.  Stage II sacral decubitus ulcer noted on sacrum, POA  Labs and images reviewed  Resolved Hospital Problem List:     Assessment & Plan:  Acute-on-chronic respiratory failure with hypercarbia/hypoxia COVID-19 pneumonia with possible superimposed bacterial pneumonia Severe ARDS OSA/OHS Acute respiratory acidosis Continue lung protective ventilation VAP prevention bundle in place Hypercapnia has improved, now pH has normalized  P/F ratio remain less than 100 Will try to prone her PAD protocol with propofol and fentanyl with RASS goal -4 Continue IV antibiotics with ceftriaxone and azithromycin Broke acetone and 0.1 Follow-up respiratory culture  specification Continue dexamethasone 6 mg for 10 days Stop remdesivir Continue diuresis  Possible right lower leg cellulitis Patient was recently treated for right lower leg cellulitis but she still has some erythema and warmth on the medial side of the thigh and lower leg Continue IV antibiotics  Sedation induced hypotension History of HTN Patient is on high amount of sedation with propofol 50 and fentanyl Leading to low blood pressure, currently requiring low-dose Levophed, titrate with MAP goal 65 Hold home antihypertensive meds including Coreg and losartan  Atrial Fibrillation, permanent;on diltiazem, coreg, Xarelto CHA2DS2-VASc Score 5 Remaining A-fib with controlled rate Continue IV heparin infusion for stroke prophylaxis Follow-up echocardiogram Optimize electrolytes for K > 4, Mg > 2  CKD stage IIIa Hypernatremia Avoid nephrotoxic agent Monitor intake and output  Morbid obesity Diet and exercise counseling as appropriate  Anemia of critical illness Monitor H&H and transfuse if less than 7  Best Practice (right click and "Reselect all SmartList Selections" daily)   Diet/type: NPO DVT prophylaxis: IV heparin infusion GI prophylaxis: PPI Lines: N/A Foley:  N/A Code Status:  full code Last date of multidisciplinary goals of care discussion: 11/21: Patient family was updated at bedside, continue full scope of care  Labs   CBC: Recent Labs  Lab 03/27/23 1305 03/27/23 2347 03/27/23 2352 03/28/23 0319 03/28/23 0758  WBC 7.1  --  8.5  --   --   NEUTROABS 5.0  --   --   --   --   HGB 9.4* 10.5* 9.6* 10.2* 8.8*  HCT 33.4* 31.0* 32.4* 30.0* 26.0*  MCV 95.7  --  92.0  --   --   PLT 191  --  216  --   --    Basic Metabolic Panel: Recent Labs  Lab 03/27/23 1305 03/27/23 2347 03/27/23 2352 03/28/23 0319 03/28/23 0758  NA 142 145 141 146* 144  K 4.3 4.7 4.7 4.5 4.1  CL 104  --  105  --   --   CO2 33*  --  27  --   --   GLUCOSE 97  --  167*  --   --   BUN  29*  --  28*  --   --   CREATININE 1.27*  --  1.27*  --   --   CALCIUM 8.6*  --  8.4*  --   --   MG  --   --  2.2  --   --   PHOS  --   --  4.0  --   --    GFR: Estimated Creatinine Clearance: 60.5 mL/min (A) (by C-G formula based on SCr of 1.27 mg/dL (H)). Recent Labs  Lab 03/27/23 1305 03/27/23 2352  PROCALCITON 0.10  --   WBC 7.1 8.5  LATICACIDVEN 1.4  --    Liver Function Tests: Recent Labs  Lab 03/27/23 1305  AST 27  ALT 30  ALKPHOS 83  BILITOT 0.8  PROT 7.0  ALBUMIN 2.7*   No results for input(s): "LIPASE", "AMYLASE" in the last 168 hours. No results for input(s): "AMMONIA" in the last 168 hours.  ABG:    Component Value Date/Time   PHART 7.369 03/28/2023 0758   PCO2ART 52.7 (H) 03/28/2023 0758   PO2ART 90 03/28/2023 0758   HCO3 30.4 (  H) 03/28/2023 0758   TCO2 32 03/28/2023 0758   O2SAT 96 03/28/2023 0758    Coagulation Profile: No results for input(s): "INR", "PROTIME" in the last 168 hours.  Cardiac Enzymes: No results for input(s): "CKTOTAL", "CKMB", "CKMBINDEX", "TROPONINI" in the last 168 hours.  HbA1C: No results found for: "HGBA1C"  CBG: Recent Labs  Lab 03/27/23 1758 03/27/23 2051 03/27/23 2342 03/28/23 0317 03/28/23 0752  GLUCAP 92 131* 141* 143* 118*    The patient is critically ill due to acute on chronic hypoxic/hypercapnic respiratory failure/severe ARDS.  Critical care was necessary to treat or prevent imminent or life-threatening deterioration.  Critical care was time spent personally by me on the following activities: development of treatment plan with patient and/or surrogate as well as nursing, discussions with consultants, evaluation of patient's response to treatment, examination of patient, obtaining history from patient or surrogate, ordering and performing treatments and interventions, ordering and review of laboratory studies, ordering and review of radiographic studies, pulse oximetry, re-evaluation of patient's condition and  participation in multidisciplinary rounds.   During this encounter critical care time was devoted to patient care services described in this note for 44 minutes.     Cheri Fowler, MD [place] See Amion for pager If no response to pager, please call [phone] until 7pm After 7pm, Please call E-link [phone]

## 2023-03-28 NOTE — Progress Notes (Addendum)
Initial Nutrition Assessment  DOCUMENTATION CODES:   Morbid obesity  INTERVENTION:  Initiate trickle tube feeds via OG: - Vital AF1.2 @ 20 ml/hr (480 ml) - Prosource TF20 60 ml TID  Provides: 816 kcal, 96 gm protein, and 390 ml water   - Free water per MD  - Provide Rena-Vit daily per tube   When medically feasible, advance Vital AF 1.2 via OG by 10 ml/hr every 8 hours until goal rate of 45 ml/hr is reached.  - Vital AF 1.2 @ 45 ml/hr (1080 ml) - Prosource TF20 60 ml TID   Provides: 1536 kcal, 141 gm protein, 875 ml water   - When goal rate of tube feeds reached, provide 1 Packet Juven BID, each packet provides 95 calories, 2.5 grams of protein when goal rate is reached   - Pt receiving additional lipid calories.   NUTRITION DIAGNOSIS:   Inadequate oral intake related to chronic illness as evidenced by NPO status.   GOAL:   Patient will meet greater than or equal to 90% of their needs   MONITOR:   I & O's, Vent status, Labs, Skin, TF tolerance  REASON FOR ASSESSMENT:   Consult, Ventilator Enteral/tube feeding initiation and management  ASSESSMENT:  27F PMH HTN, CKD stg IIIa, Afib, asthma, obesity, CHF, obesity, asthma, OSA and chronic respiratory failure on 2L is admitted for acute on chronic hypoxemic respiratory failure.  Pt is COVID-19 positive and intubated,sedated. Consulted to start trickle feeds. Pt has OG tube in place, per x-ray is gastric. Pt has severe edema on their legs, abdomen, and RUE. Pt proned this Morning.   Low does Levophed, Peep consistently at 10 and pt receiving 100% of oxygen from the vent.   No family at bedside. Per EDP, "states that for the past 2.5 months has been feeling weak."  Pt's home meds include Wegovy, Pro-Stat, Ferrous Sulfate, MVI.  Suspect pt is malnourished but unable to diagnosis at this time due to limiting evidence. Will be able to gain a better clinical picture when pt is diuresis.   Patient is currently intubated  on ventilator support Temp (24hrs), Avg:98.3 F (36.8 C), Min:97.8 F (36.6 C), Max:98.9 F (37.2 C)  Propofol: 51.5 ml/hr Providing: 1360 kcal   Admit weight: 171.8 kg   Current weight: 168.4 kg    Intake/Output Summary (Last 24 hours) at 03/28/2023 1314 Last data filed at 03/28/2023 1200 Gross per 24 hour  Intake 2634.19 ml  Output 600 ml  Net 2034.19 ml   Net IO Since Admission: 2,034.19 mL [03/28/23 1314]  Nutritionally Relevant Medications: Scheduled Meds:  docusate  100 mg Per Tube BID   furosemide  60 mg Intravenous Q8H   insulin aspart  0-9 Units Subcutaneous Q4H   levothyroxine  25 mcg Per Tube Q0600   polyethylene glycol  17 g Per Tube Daily   Continuous Infusions:  fentaNYL infusion INTRAVENOUS 25 mcg/hr (03/28/23 0943)   norepinephrine (LEVOPHED) Adult infusion Stopped (03/28/23 0545)   propofol (DIPRIVAN) infusion 50 mcg/kg/min (03/28/23 1116)   Labs Reviewed: BUN 28, 1.27, Calcium 8.4 CBG ranges from 92-143 mg/dL over the last 24 hours  NUTRITION - FOCUSED PHYSICAL EXAM: -Unable to access several areas due to fluid accumulation.   Flowsheet Row Most Recent Value  Orbital Region Unable to assess  Upper Arm Region Moderate depletion  Thoracic and Lumbar Region Unable to assess  Buccal Region Unable to assess  Temple Region Moderate depletion  Clavicle Bone Region Mild depletion  Clavicle and Acromion  Bone Region Moderate depletion  Scapular Bone Region Unable to assess  Dorsal Hand Unable to assess  Patellar Region Unable to assess  Anterior Thigh Region Unable to assess  Posterior Calf Region Unable to assess  Edema (RD Assessment) Severe  Hair Reviewed  Eyes Unable to assess  Mouth Unable to assess  Skin Reviewed  [Cellulitis on legs]  Nails Reviewed       Diet Order:   Diet Order             Diet NPO time specified  Diet effective now                   EDUCATION NEEDS:   Not appropriate for education at this time  Skin:   Skin Integrity Issues:: Other (Comment), Stage II Stage II: Sacrum Other: Severe Edema on legs and RUE  Last BM:  11/21: smear  Height:   Ht Readings from Last 1 Encounters:  03/27/23 5\' 4"  (1.626 m)    Weight:   Wt Readings from Last 1 Encounters:  03/27/23 (!) 168.4 kg    Ideal Body Weight:  54.55 kg  BMI:  Body mass index is 63.73 kg/m.  Estimated Nutritional Needs:   Kcal:  1400-1600 kcal  Protein:  120-140 gm  Fluid:  >1.2L  Elliot Dally, RD Registered Dietitian  See Amion for more information

## 2023-03-28 NOTE — Progress Notes (Signed)
PHARMACY - ANTICOAGULATION CONSULT NOTE  Pharmacy Consult for heparin Indication: atrial fibrillation  Allergies  Allergen Reactions   Tetanus Toxoid     REACTION: swelling    Patient Measurements: Height: 5\' 4"  (162.6 cm) Weight: (!) 168.4 kg (371 lb 4.1 oz) IBW/kg (Calculated) : 54.7 Heparin Dosing Weight: 98.4 kg  Vital Signs: Temp: 97.8 F (36.6 C) (11/21 1149) Temp Source: Axillary (11/21 1149) BP: 111/68 (11/21 0945) Pulse Rate: 86 (11/21 1215)  Labs: Recent Labs    03/27/23 1305 03/27/23 2347 03/27/23 2352 03/28/23 0319 03/28/23 0758 03/28/23 1118 03/28/23 1217  HGB 9.4*   < > 9.6* 10.2* 8.8*  --  9.9*  HCT 33.4*   < > 32.4* 30.0* 26.0*  --  29.0*  PLT 191  --  216  --   --   --   --   APTT  --   --  28  --   --  84*  --   HEPARINUNFRC  --   --  >1.10*  --   --   --   --   CREATININE 1.27*  --  1.27*  --   --   --   --    < > = values in this interval not displayed.    Estimated Creatinine Clearance: 60.5 mL/min (A) (by C-G formula based on SCr of 1.27 mg/dL (H)).    Assessment: 88 yoF presented with respiratory failure and Covid-19 requiring mechanical ventilation. Pharmacy consulted to dose heparin for atrial fibrillation.  Per nursing home facility [facility], patient was not taking Xarelto, but baseline heparin level was >1.1, indicating that Xarelto is in patient's system.  aPTT therapeutic at 84 sec; no bleeding reported.  Goal of Therapy:  Heparin level 0.3-0.7 units/ml aPTT 66-102 seconds Monitor platelets by anticoagulation protocol: Yes   Plan:  Continue heparin infusion at 1500 units/hr Daily aPTT and heparin level daily until levels correlate  Cylas Falzone D. Laney Potash, PharmD, BCPS, BCCCP 03/28/2023, 1:22 PM

## 2023-03-29 ENCOUNTER — Inpatient Hospital Stay (HOSPITAL_COMMUNITY): Payer: Medicare Other

## 2023-03-29 DIAGNOSIS — I4821 Permanent atrial fibrillation: Secondary | ICD-10-CM | POA: Diagnosis not present

## 2023-03-29 DIAGNOSIS — J80 Acute respiratory distress syndrome: Secondary | ICD-10-CM | POA: Diagnosis not present

## 2023-03-29 DIAGNOSIS — I509 Heart failure, unspecified: Secondary | ICD-10-CM | POA: Diagnosis not present

## 2023-03-29 DIAGNOSIS — N1831 Chronic kidney disease, stage 3a: Secondary | ICD-10-CM | POA: Diagnosis not present

## 2023-03-29 DIAGNOSIS — U071 COVID-19: Secondary | ICD-10-CM | POA: Diagnosis not present

## 2023-03-29 LAB — POCT I-STAT 7, (LYTES, BLD GAS, ICA,H+H)
Acid-Base Excess: 8 mmol/L — ABNORMAL HIGH (ref 0.0–2.0)
Acid-Base Excess: 4 mmol/L — ABNORMAL HIGH (ref 0.0–2.0)
Acid-Base Excess: 7 mmol/L — ABNORMAL HIGH (ref 0.0–2.0)
Acid-Base Excess: 6 mmol/L — ABNORMAL HIGH (ref 0.0–2.0)
Acid-Base Excess: 2 mmol/L (ref 0.0–2.0)
Bicarbonate: 32.7 mmol/L — ABNORMAL HIGH (ref 20.0–28.0)
Bicarbonate: 29.5 mmol/L — ABNORMAL HIGH (ref 20.0–28.0)
Bicarbonate: 32.9 mmol/L — ABNORMAL HIGH (ref 20.0–28.0)
Bicarbonate: 32.9 mmol/L — ABNORMAL HIGH (ref 20.0–28.0)
Bicarbonate: 29.5 mmol/L — ABNORMAL HIGH (ref 20.0–28.0)
Calcium, Ion: 1.1 mmol/L — ABNORMAL LOW (ref 1.15–1.40)
Calcium, Ion: 1.11 mmol/L — ABNORMAL LOW (ref 1.15–1.40)
Calcium, Ion: 1.11 mmol/L — ABNORMAL LOW (ref 1.15–1.40)
Calcium, Ion: 1.1 mmol/L — ABNORMAL LOW (ref 1.15–1.40)
Calcium, Ion: 1.1 mmol/L — ABNORMAL LOW (ref 1.15–1.40)
HCT: 30 % — ABNORMAL LOW (ref 36.0–46.0)
HCT: 31 % — ABNORMAL LOW (ref 36.0–46.0)
HCT: 32 % — ABNORMAL LOW (ref 36.0–46.0)
HCT: 29 % — ABNORMAL LOW (ref 36.0–46.0)
HCT: 29 % — ABNORMAL LOW (ref 36.0–46.0)
Hemoglobin: 10.2 g/dL — ABNORMAL LOW (ref 12.0–15.0)
Hemoglobin: 10.5 g/dL — ABNORMAL LOW (ref 12.0–15.0)
Hemoglobin: 10.9 g/dL — ABNORMAL LOW (ref 12.0–15.0)
Hemoglobin: 9.9 g/dL — ABNORMAL LOW (ref 12.0–15.0)
Hemoglobin: 9.9 g/dL — ABNORMAL LOW (ref 12.0–15.0)
O2 Saturation: 100 %
O2 Saturation: 97 %
O2 Saturation: 98 %
O2 Saturation: 98 %
O2 Saturation: 97 %
Patient temperature: 98
Patient temperature: 37.1
Patient temperature: 37
Patient temperature: 98
Patient temperature: 98
Potassium: 3.5 mmol/L (ref 3.5–5.1)
Potassium: 3.8 mmol/L (ref 3.5–5.1)
Potassium: 4.2 mmol/L (ref 3.5–5.1)
Potassium: 4.1 mmol/L (ref 3.5–5.1)
Potassium: 4.4 mmol/L (ref 3.5–5.1)
Sodium: 145 mmol/L (ref 135–145)
Sodium: 145 mmol/L (ref 135–145)
Sodium: 145 mmol/L (ref 135–145)
Sodium: 145 mmol/L (ref 135–145)
Sodium: 144 mmol/L (ref 135–145)
TCO2: 34 mmol/L — ABNORMAL HIGH (ref 22–32)
TCO2: 31 mmol/L (ref 22–32)
TCO2: 35 mmol/L — ABNORMAL HIGH (ref 22–32)
TCO2: 35 mmol/L — ABNORMAL HIGH (ref 22–32)
TCO2: 31 mmol/L (ref 22–32)
pCO2 arterial: 45 mm[Hg] (ref 32–48)
pCO2 arterial: 47.2 mm[Hg] (ref 32–48)
pCO2 arterial: 54.5 mm[Hg] — ABNORMAL HIGH (ref 32–48)
pCO2 arterial: 56.1 mm[Hg] — ABNORMAL HIGH (ref 32–48)
pCO2 arterial: 58.3 mm[Hg] — ABNORMAL HIGH (ref 32–48)
pH, Arterial: 7.468 — ABNORMAL HIGH (ref 7.35–7.45)
pH, Arterial: 7.405 (ref 7.35–7.45)
pH, Arterial: 7.388 (ref 7.35–7.45)
pH, Arterial: 7.374 (ref 7.35–7.45)
pH, Arterial: 7.31 — ABNORMAL LOW (ref 7.35–7.45)
pO2, Arterial: 179 mm[Hg] — ABNORMAL HIGH (ref 83–108)
pO2, Arterial: 88 mm[Hg] (ref 83–108)
pO2, Arterial: 111 mm[Hg] — ABNORMAL HIGH (ref 83–108)
pO2, Arterial: 116 mm[Hg] — ABNORMAL HIGH (ref 83–108)
pO2, Arterial: 98 mm[Hg] (ref 83–108)

## 2023-03-29 LAB — ECHOCARDIOGRAM COMPLETE
AR max vel: 1.73 cm2
AV Area VTI: 1.73 cm2
AV Area mean vel: 1.58 cm2
AV Mean grad: 4 mm[Hg]
AV Peak grad: 6.5 mm[Hg]
Ao pk vel: 1.27 m/s
Height: 64 in
S' Lateral: 3.4 cm
Weight: 6398.63 [oz_av]

## 2023-03-29 LAB — CBC
HCT: 30.7 % — ABNORMAL LOW (ref 36.0–46.0)
Hemoglobin: 9.6 g/dL — ABNORMAL LOW (ref 12.0–15.0)
MCH: 27.4 pg (ref 26.0–34.0)
MCHC: 31.3 g/dL (ref 30.0–36.0)
MCV: 87.7 fL (ref 80.0–100.0)
Platelets: 252 10*3/uL (ref 150–400)
RBC: 3.5 MIL/uL — ABNORMAL LOW (ref 3.87–5.11)
RDW: 15.4 % (ref 11.5–15.5)
WBC: 11.3 10*3/uL — ABNORMAL HIGH (ref 4.0–10.5)
nRBC: 0 % (ref 0.0–0.2)

## 2023-03-29 LAB — CULTURE, BLOOD (ROUTINE X 2): Special Requests: ADEQUATE

## 2023-03-29 LAB — GLUCOSE, CAPILLARY
Glucose-Capillary: 127 mg/dL — ABNORMAL HIGH (ref 70–99)
Glucose-Capillary: 144 mg/dL — ABNORMAL HIGH (ref 70–99)
Glucose-Capillary: 156 mg/dL — ABNORMAL HIGH (ref 70–99)
Glucose-Capillary: 148 mg/dL — ABNORMAL HIGH (ref 70–99)
Glucose-Capillary: 218 mg/dL — ABNORMAL HIGH (ref 70–99)
Glucose-Capillary: 145 mg/dL — ABNORMAL HIGH (ref 70–99)

## 2023-03-29 LAB — APTT
aPTT: 126 s — ABNORMAL HIGH (ref 24–36)
aPTT: 66 s — ABNORMAL HIGH (ref 24–36)

## 2023-03-29 LAB — BASIC METABOLIC PANEL
Anion gap: 12 (ref 5–15)
BUN: 31 mg/dL — ABNORMAL HIGH (ref 8–23)
CO2: 30 mmol/L (ref 22–32)
Calcium: 8 mg/dL — ABNORMAL LOW (ref 8.9–10.3)
Chloride: 101 mmol/L (ref 98–111)
Creatinine, Ser: 1.24 mg/dL — ABNORMAL HIGH (ref 0.44–1.00)
GFR, Estimated: 45 mL/min — ABNORMAL LOW (ref >60)
Glucose, Bld: 135 mg/dL — ABNORMAL HIGH (ref 70–99)
Potassium: 3.5 mmol/L (ref 3.5–5.1)
Sodium: 143 mmol/L (ref 135–145)

## 2023-03-29 LAB — HEPARIN LEVEL (UNFRACTIONATED): Heparin Unfractionated: >1.1 [IU]/mL — ABNORMAL HIGH (ref 0.30–0.70)

## 2023-03-29 MED ORDER — VANCOMYCIN HCL 1500 MG/300ML IV SOLN
1500.0000 mg | INTRAVENOUS | Status: DC
  Administered 2023-03-30 – 2023-04-01 (×3): 1500 mg via INTRAVENOUS
  Filled 2023-03-29 (×3): qty 300

## 2023-03-29 MED ORDER — FUROSEMIDE 10 MG/ML IJ SOLN
60.0000 mg | Freq: Three times a day (TID) | INTRAMUSCULAR | Status: AC
  Administered 2023-03-29 – 2023-03-30 (×3): 60 mg via INTRAVENOUS
  Filled 2023-03-29 (×3): qty 6

## 2023-03-29 MED ORDER — VANCOMYCIN HCL 10 G IV SOLR
2500.0000 mg | INTRAVENOUS | Status: AC
  Filled 2023-03-29: qty 25

## 2023-03-29 MED ORDER — ALBUMIN HUMAN 25 % IV SOLN
25.0000 g | Freq: Once | INTRAVENOUS | Status: AC
  Administered 2023-03-29: 25 g via INTRAVENOUS
  Filled 2023-03-29: qty 100

## 2023-03-29 MED ORDER — PHENYLEPHRINE 80 MCG/ML (10ML) SYRINGE FOR IV PUSH (FOR BLOOD PRESSURE SUPPORT)
PREFILLED_SYRINGE | INTRAVENOUS | Status: AC
  Filled 2023-03-29: qty 10

## 2023-03-29 MED ORDER — PERFLUTREN LIPID MICROSPHERE
3.0000 mL | INTRAVENOUS | Status: AC | PRN
  Administered 2023-03-29: 3 mL via INTRAVENOUS

## 2023-03-29 MED ORDER — SODIUM BICARBONATE 8.4 % IV SOLN
INTRAVENOUS | Status: AC
  Filled 2023-03-29: qty 50

## 2023-03-29 MED ORDER — POTASSIUM CHLORIDE 20 MEQ PO PACK
40.0000 meq | PACK | Freq: Once | ORAL | Status: AC
  Administered 2023-03-29: 40 meq
  Filled 2023-03-29: qty 2

## 2023-03-29 MED ORDER — PROSOURCE TF20 ENFIT COMPATIBL EN LIQD
60.0000 mL | Freq: Every day | ENTERAL | Status: DC
  Administered 2023-03-29 – 2023-04-01 (×14): 60 mL
  Filled 2023-03-29 (×13): qty 60

## 2023-03-29 MED ORDER — ACETAZOLAMIDE 250 MG PO TABS
500.0000 mg | ORAL_TABLET | Freq: Two times a day (BID) | ORAL | Status: AC
  Administered 2023-03-29 (×2): 500 mg
  Filled 2023-03-29 (×2): qty 2

## 2023-03-29 MED ORDER — POTASSIUM CHLORIDE 20 MEQ PO PACK
40.0000 meq | PACK | Freq: Two times a day (BID) | ORAL | Status: AC
  Administered 2023-03-29 (×2): 40 meq
  Filled 2023-03-29 (×2): qty 2

## 2023-03-29 MED ORDER — POTASSIUM CHLORIDE 20 MEQ PO PACK: 60.0000 meq | PACK | Freq: Once | ORAL | Status: DC

## 2023-03-29 NOTE — Progress Notes (Addendum)
Nutrition Brief Note  Pt tolerating tube feeds. Discussed with MD, plan to keep tube feeds at trickle over the weekend. Will add extra Prosource TF20 to meet pt's needs. Pt proned yesteray, Supine this morning, then proned again today.   Intervention: Continue  trickle tube feeds via OG: - Vital AF1.2 @ 20 ml/hr (480 ml) - Increase Prosource TF20 60 ml to 5 times daily   Provides: 976 kcal, 136 gm protein, and 390 ml water (69% kcal needs and 100% protein needs)   - Free water per MD   - Provide Rena-Vit daily per tube    When medically feasible, advance Vital AF 1.2 via OG by 10 ml/hr every 8 hours until goal rate of 45 ml/hr is reached.  - Vital AF 1.2 @ 45 ml/hr (1080 ml) - Prosource TF20 60 ml TID    Provides: 1536 kcal, 141 gm protein, 875 ml water    - When goal rate of tube feeds reached, provide 1 Packet Juven BID, each packet provides 95 calories, 2.5 grams of protein when goal rate is reached    - Pt receiving additional lipid calories.   If nutrition issues arise, please consult RD.   Elliot Dally, RD Registered Dietitian  See Amion for more information

## 2023-03-29 NOTE — Plan of Care (Signed)
  Problem: Clinical Measurements: Goal: Ability to maintain clinical measurements within normal limits will improve Outcome: Progressing Goal: Will remain free from infection Outcome: Progressing Goal: Diagnostic test results will improve Outcome: Progressing Goal: Respiratory complications will improve Outcome: Progressing Goal: Cardiovascular complication will be avoided Outcome: Progressing   Problem: Nutrition: Goal: Adequate nutrition will be maintained Outcome: Progressing   Problem: Pain Management: Goal: General experience of comfort will improve Outcome: Progressing

## 2023-03-29 NOTE — Progress Notes (Signed)
Patient proned per MD's order with RT x2 and RN x4 at bedside. No complications noted, RT able to pass suction catheter. Vitals stable.

## 2023-03-29 NOTE — Progress Notes (Signed)
  RN X 3,  RT X 2  turned pt head to right no complications

## 2023-03-29 NOTE — Progress Notes (Signed)
Patient's head turned with RT and RN x 2, no complications noted. Vitals stable, RT able to pass suction catheter.

## 2023-03-29 NOTE — Progress Notes (Addendum)
Patient's head turned with RT X 2 and RN at bedside. No complications noted, patient's vitals stable. RT able to pass suction catheter.

## 2023-03-29 NOTE — Progress Notes (Signed)
[facility] ADULT ICU REPLACEMENT PROTOCOL   The patient does apply for the [facility] Adult ICU Electrolyte Replacment Protocol based on the criteria listed below:   1.Exclusion criteria: TCTS, ECMO, Dialysis, and Myasthenia Gravis patients 2. Is GFR >/= 30 ml/min? Yes.    Patient's GFR today is 45 3. Is SCr </= 2? Yes.   Patient's SCr is 1.24 mg/dL 4. Did SCr increase >/= 0.5 in 24 hours? No. 5.Pt's weight >40kg  Yes.   6. Abnormal electrolyte(s): K+ = 3.5   7. Electrolytes replaced per protocol 8.  Call MD STAT for K+ </= 2.5, Phos </= 1, or Mag </= 1 Physician:  Delia Chimes, eMD  Faye Ramsay 03/29/2023 5:45 AM

## 2023-03-29 NOTE — Progress Notes (Addendum)
NAME:  Kaylee [name], MRN:  [id number], DOB:  [birth year], LOS: 2 ADMISSION DATE:  03/27/2023, CONSULTATION DATE:  03/27/2023 REFERRING MD:  Elgergawy - TRH, CHIEF COMPLAINT: SOB  History of Present Illness:  77 year old woman who presented to [facility] ED 11/20 from [facility] with shortness of breath and increased oxygen requirement. PMHx significant for HTN, Afib (on Xarelto), HFpEF (Echo 09/2020 with EF 65-70%, normal LV/RV function, moderately dilated LA/RA), OSA/?OHS on 2L HOT, asthma, obesity. Recent admission to [facility] 10/3-10/9 for sepsis 2/2 cellulitis of RLE complicated by AKI; patient was discharged to [facility] to complete meropenem course (Lasix stopped at d/c).  History is obtained from chart review. Per EDP, "states that for the past 2.5 months has been feeling weak." Reportedly had positive COVID test 2 days prior to arrival with increasing O2 needs at facility prior to presentation. Denied chest pain, swelling. Labs were notable for WBC 7.1, Hgb 9.4, BUN 29, Cr 1.27, LA 1.4, VBG pH 7.3/pCO2 75, BNP 353. RVP panel + COVID and blood cultures pending. CXR demonstrated edema. On exam she was hypotensive, wheezing. Received Solumedrol, Duonebs, given 1L IVF total for ongoing hypotension. She was placed on BiPAP for ongoing work of breathing. She was initially admitted to hospitalist at [facility] who consulted PCCM for transfer and admission.   Prior to transfer, respiratory status continued to deteriorate prompting intubation.  PCCM consulted for ICU admission.  Pertinent Medical History:  Atrial fibrillation on Xarelto, CHF, obesity, asthma, obstructive sleep apnea on 2L [place] chronically  Significant Hospital Events: Including procedures, antibiotic start and stop dates in addition to other pertinent events   11/20 - Admit to PCCM, intubated at [facility] prior to transport to [facility]  Interim History / Subjective:  Patient was proned yesterday for ARDS, she was placed supine this  morning Currently on FiO2 of 60% and PEEP of 12 Remain afebrile Still requiring vasopressor support due to deep sedation One of the 2 blood culture bottles came back positive for gram-positive cocci   Objective   Blood pressure 111/68, pulse 89, temperature (!) 96.1 F (35.6 C), resp. rate (!) 26, height 5\' 4"  (1.626 m), weight (!) 181.4 kg, SpO2 98%.    Vent Mode: PRVC FiO2 (%):  [60 %-100 %] 60 % Set Rate:  [26 bmp-28 bmp] 26 bmp Vt Set:  [430 mL] 430 mL PEEP:  [12 cmH20] 12 cmH20 Plateau Pressure:  [23 cmH20-24 cmH20] 23 cmH20   Intake/Output Summary (Last 24 hours) at 03/29/2023 0917 Last data filed at 03/29/2023 8295 Gross per 24 hour  Intake 2897.04 ml  Output 5700 ml  Net -2802.96 ml   Filed Weights   03/27/23 2059 03/27/23 2200 03/29/23 0500  Weight: (!) 168.4 kg (!) 168.4 kg (!) 181.4 kg   Physical Examination: General: Crtitically ill-appearing elderly morbidly obese female, orally intubated HEENT: [place]/AT, eyes anicteric.  ETT and OGT in place Neuro: Sedated, not following commands.  Eyes are closed.  Pupils 3 mm bilateral reactive to light Chest: Coarse breath sounds, no wheezes or rhonchi Heart: Irregularly irregular, no murmurs or gallops Abdomen: Soft, nondistended, bowel sounds present Skin: Fading erythema noted on medial side of thigh and lower leg on right.  Stage II decubitus ulcer noted on sacrum, POA  Labs and images reviewed  Resolved Hospital Problem List:     Assessment & Plan:  Acute-on-chronic respiratory failure with hypercarbia/hypoxia COVID-19 pneumonia with possible superimposed bacterial pneumonia Severe ARDS OSA/OHS Acute respiratory acidosis Continue lung protective ventilation VAP prevention  bundle in place Hypercapnia has cleared Patient was proned yesterday due to P/F ratio was less than 100, she was supine at 3 AM this morning Posterior supine ABG showed P/F ratio 298 Repeat ABGs pending, based on that we will decide if we need  to repeat prone hide today PAD protocol with propofol and fentanyl with RASS goal -4 Continue IV antibiotics with ceftriaxone and azithromycin Respiratory culture is growing gram-variable rods and gram-positive cocci in pairs Continue dexamethasone 6 mg for 10 days Continue aggressive diuresis, remain net -2 L yesterday with improvement in FiO2 requirement Currently on Lasix and will add acetazolamide to prevent metabolic alkalosis  Possible right lower leg cellulitis Patient was recently treated for right lower leg cellulitis but she still has some erythema and warmth on the medial side of the thigh and lower leg Continue IV antibiotics  Gram-positive cocci bacteremia versus contamination Patient one of the 2 blood culture bottles came back positive for gram-positive cocci Waiting for specification Continue IV vancomycin and ceftriaxone  Sedation induced hypotension History of HTN Patient is on high amount of sedation with propofol 50 and fentanyl 400 Leading to low blood pressure, currently requiring Levophed at 9 mics, titrate with MAP goal 65 Hold home antihypertensive meds including Coreg and losartan  Atrial Fibrillation, permanent;on diltiazem, coreg, Xarelto CHA2DS2-VASc Score 5 Remaining A-fib with controlled rate Continue IV heparin infusion for stroke prophylaxis Echocardiogram was not done yesterday due to prone position, will call them today again Optimize electrolytes for K > 4, Mg > 2  CKD stage IIIa Hypernatremia Avoid nephrotoxic agent Monitor intake and output Serum sodium improved to 143, closely monitor electrolytes  Morbid obesity Diet and exercise counseling as appropriate  Anemia of critical illness Monitor H&H and transfuse if less than 7  Best Practice (right click and "Reselect all SmartList Selections" daily)   Diet/type: NPO trickle tube feed DVT prophylaxis: IV heparin infusion GI prophylaxis: PPI Lines: N/A Foley:  N/A Code Status:  full  code Last date of multidisciplinary goals of care discussion: 11/21: Patient family was updated at bedside, continue full scope of care  Labs   CBC: Recent Labs  Lab 03/27/23 1305 03/27/23 2347 03/27/23 2352 03/28/23 0319 03/28/23 1217 03/28/23 1736 03/28/23 2215 03/29/23 0405 03/29/23 0421  WBC 7.1  --  8.5  --   --   --   --  11.3*  --   NEUTROABS 5.0  --   --   --   --   --   --   --   --   HGB 9.4*   < > 9.6*   < > 9.9* 9.9* 10.2* 9.6* 10.2*  HCT 33.4*   < > 32.4*   < > 29.0* 29.0* 30.0* 30.7* 30.0*  MCV 95.7  --  92.0  --   --   --   --  87.7  --   PLT 191  --  216  --   --   --   --  252  --    < > = values in this interval not displayed.   Basic Metabolic Panel: Recent Labs  Lab 03/27/23 1305 03/27/23 2347 03/27/23 2352 03/28/23 0319 03/28/23 1217 03/28/23 1736 03/28/23 2215 03/29/23 0405 03/29/23 0421  NA 142   < > 141   < > 144 146* 144 143 145  K 4.3   < > 4.7   < > 3.8 3.7 3.6 3.5 3.5  CL 104  --  105  --   --   --   --  101  --   CO2 33*  --  27  --   --   --   --  30  --   GLUCOSE 97  --  167*  --   --   --   --  135*  --   BUN 29*  --  28*  --   --   --   --  31*  --   CREATININE 1.27*  --  1.27*  --   --   --   --  1.24*  --   CALCIUM 8.6*  --  8.4*  --   --   --   --  8.0*  --   MG  --   --  2.2  --   --   --   --   --   --   PHOS  --   --  4.0  --   --   --   --   --   --    < > = values in this interval not displayed.   GFR: Estimated Creatinine Clearance: 65.2 mL/min (A) (by C-G formula based on SCr of 1.24 mg/dL (H)). Recent Labs  Lab 03/27/23 1305 03/27/23 2352 03/29/23 0405  PROCALCITON 0.10  --   --   WBC 7.1 8.5 11.3*  LATICACIDVEN 1.4  --   --    Liver Function Tests: Recent Labs  Lab 03/27/23 1305  AST 27  ALT 30  ALKPHOS 83  BILITOT 0.8  PROT 7.0  ALBUMIN 2.7*   No results for input(s): "LIPASE", "AMYLASE" in the last 168 hours. No results for input(s): "AMMONIA" in the last 168 hours.  ABG:    Component Value  Date/Time   PHART 7.468 (H) 03/29/2023 0421   PCO2ART 45.0 03/29/2023 0421   PO2ART 179 (H) 03/29/2023 0421   HCO3 32.7 (H) 03/29/2023 0421   TCO2 34 (H) 03/29/2023 0421   O2SAT 100 03/29/2023 0421    Coagulation Profile: No results for input(s): "INR", "PROTIME" in the last 168 hours.  Cardiac Enzymes: No results for input(s): "CKTOTAL", "CKMB", "CKMBINDEX", "TROPONINI" in the last 168 hours.  HbA1C: Hgb A1c MFr Bld  Date/Time Value Ref Range Status  03/27/2023 11:52 PM 5.4 4.8 - 5.6 % Final    Comment:    (NOTE) Pre diabetes:          5.7%-6.4%  Diabetes:              >6.4%  Glycemic control for   <7.0% adults with diabetes     CBG: Recent Labs  Lab 03/28/23 1544 03/28/23 1920 03/28/23 2312 03/29/23 0313 03/29/23 0732  GLUCAP 143* 134* 144* 127* 144*    The patient is critically ill due to acute on chronic hypoxic/hypercapnic respiratory failure/severe ARDS.  Critical care was necessary to treat or prevent imminent or life-threatening deterioration.  Critical care was time spent personally by me on the following activities: development of treatment plan with patient and/or surrogate as well as nursing, discussions with consultants, evaluation of patient's response to treatment, examination of patient, obtaining history from patient or surrogate, ordering and performing treatments and interventions, ordering and review of laboratory studies, ordering and review of radiographic studies, pulse oximetry, re-evaluation of patient's condition and participation in multidisciplinary rounds.   During this encounter critical care time was devoted to patient care services described in this note for 37 minutes.     Cheri Fowler, MD [place] [facility] See Amion for  pager If no response to pager, please call [phone] until 7pm After 7pm, Please call E-link [phone]

## 2023-03-29 NOTE — Progress Notes (Signed)
Patients head turned with RT and RN x2, no complications noted. Vitals stable, RT able to pass suction catheter.

## 2023-03-29 NOTE — Progress Notes (Signed)
Pt started becoming hypotensive and sustaining into 70's/50's MAPs in low 50's. Levo maxed at 10 mcg. Provider Merrily Pew MD called and notified. Orders to hold sedation, and will send team to bedside.  BP increased after sedation paused, sustaining. Sedation resumed as tolerated.

## 2023-03-29 NOTE — Progress Notes (Signed)
PHARMACY - ANTICOAGULATION CONSULT NOTE  Pharmacy Consult for heparin Indication: atrial fibrillation  Allergies  Allergen Reactions   Tetanus Toxoid     REACTION: swelling    Patient Measurements: Height: 5\' 4"  (162.6 cm) Weight: (!) 181.4 kg (399 lb 14.6 oz) IBW/kg (Calculated) : 54.7 Heparin Dosing Weight: 98.4 kg  Vital Signs: Temp: 95.2 F (35.1 C) (11/22 0515) Temp Source: Rectal (11/22 0000) Pulse Rate: 70 (11/22 0515)  Labs: Recent Labs    03/27/23 1305 03/27/23 2347 03/27/23 2352 03/28/23 0319 03/28/23 1118 03/28/23 1217 03/28/23 2215 03/29/23 0405 03/29/23 0421  HGB 9.4*   < > 9.6*   < >  --    < > 10.2* 9.6* 10.2*  HCT 33.4*   < > 32.4*   < >  --    < > 30.0* 30.7* 30.0*  PLT 191  --  216  --   --   --   --  252  --   APTT  --   --  28  --  84*  --   --  126*  --   HEPARINUNFRC  --   --  >1.10*  --   --   --   --  >1.10*  --   CREATININE 1.27*  --  1.27*  --   --   --   --  1.24*  --    < > = values in this interval not displayed.    Estimated Creatinine Clearance: 65.2 mL/min (A) (by C-G formula based on SCr of 1.24 mg/dL (H)).    Assessment: 4 yoF presented with respiratory failure and Covid-19 requiring mechanical ventilation. Pharmacy consulted to dose heparin for atrial fibrillation.  Per nursing home facility [facility], patient was not taking Xarelto, but baseline heparin level was >1.1, indicating that Xarelto is in patient's system.  aPTT supratherapeutic at 126 sec; per RN no issues with the heparin running continuously and lab drawn appropriately. No bleeding with CBC showing Hgb 10s and plts 252  Goal of Therapy:  Heparin level 0.3-0.7 units/ml aPTT 66-102 seconds Monitor platelets by anticoagulation protocol: Yes   Plan:  Decrease heparin infusion to 1300 units/hr 8h aPTT Daily aPTT and heparin level daily until levels correlate Monitor CBC daily  Arabella Merles, PharmD. Clinical Pharmacist 03/29/2023 6:18 AM

## 2023-03-29 NOTE — Progress Notes (Signed)
Pt flipped back to supine position per protocol RN x 3 & RT x 3 present. Vitals signs are stable and pt is getting their appropriate tidal volumes at this time.

## 2023-03-29 NOTE — Progress Notes (Signed)
PHARMACY - ANTICOAGULATION CONSULT NOTE  Pharmacy Consult for heparin Indication: atrial fibrillation  Allergies  Allergen Reactions   Tetanus Toxoid     REACTION: swelling    Patient Measurements: Height: 5\' 4"  (162.6 cm) Weight: (!) 181.4 kg (399 lb 14.6 oz) IBW/kg (Calculated) : 54.7 Heparin Dosing Weight: 98.4 kg  Vital Signs: Temp: 97.5 F (36.4 C) (11/22 1700) Temp Source: Rectal (11/22 1600) Pulse Rate: 90 (11/22 1700)  Labs: Recent Labs    03/27/23 1305 03/27/23 2347 03/27/23 2352 03/28/23 0319 03/28/23 1118 03/28/23 1217 03/29/23 0405 03/29/23 0421 03/29/23 1013 03/29/23 1243 03/29/23 1500 03/29/23 1627  HGB 9.4*   < > 9.6*   < >  --    < > 9.6*   < > 10.5* 10.9*  --  9.9*  HCT 33.4*   < > 32.4*   < >  --    < > 30.7*   < > 31.0* 32.0*  --  29.0*  PLT 191  --  216  --   --   --  252  --   --   --   --   --   APTT  --    < > 28  --  84*  --  126*  --   --   --  66*  --   HEPARINUNFRC  --   --  >1.10*  --   --   --  >1.10*  --   --   --   --   --   CREATININE 1.27*  --  1.27*  --   --   --  1.24*  --   --   --   --   --    < > = values in this interval not displayed.    Estimated Creatinine Clearance: 65.2 mL/min (A) (by C-G formula based on SCr of 1.24 mg/dL (H)).    Assessment: 34 yoF presented with respiratory failure and Covid-19 requiring mechanical ventilation. Pharmacy consulted to dose heparin for atrial fibrillation.  Per nursing home facility [facility], patient was not taking Xarelto, but baseline heparin level was >1.1, indicating that Xarelto is in patient's system.  aPTT now down to the low end of therapeutic range (66 sec) on infusion at 1300 units/hr. No bleeding noted.  Goals: Heparin level 0.3-0.7 units/ml aPTT 66-102 seconds Monitor platelets by anticoagulation protocol: Yes   Plan:  Increase heparin infusion slightly to 1350 units/hr to keep in therapeutic range F/u a.m. aPTT  Christoper Fabian, PharmD, BCPS Please see amion for complete  clinical pharmacist phone list 03/29/2023 5:14 PM

## 2023-03-29 NOTE — Progress Notes (Signed)
Pt head turned with RT x 2 and RN x 2. No complications noted, vitals stable, and suction catheter passes through.

## 2023-03-30 ENCOUNTER — Inpatient Hospital Stay (HOSPITAL_COMMUNITY): Payer: Medicare Other

## 2023-03-30 DIAGNOSIS — J80 Acute respiratory distress syndrome: Secondary | ICD-10-CM

## 2023-03-30 DIAGNOSIS — N1831 Chronic kidney disease, stage 3a: Secondary | ICD-10-CM | POA: Diagnosis not present

## 2023-03-30 LAB — POCT I-STAT 7, (LYTES, BLD GAS, ICA,H+H)
Acid-Base Excess: 6 mmol/L — ABNORMAL HIGH (ref 0.0–2.0)
Acid-Base Excess: 7 mmol/L — ABNORMAL HIGH (ref 0.0–2.0)
Acid-Base Excess: 3 mmol/L — ABNORMAL HIGH (ref 0.0–2.0)
Bicarbonate: 32.6 mmol/L — ABNORMAL HIGH (ref 20.0–28.0)
Bicarbonate: 33.7 mmol/L — ABNORMAL HIGH (ref 20.0–28.0)
Bicarbonate: 31.7 mmol/L — ABNORMAL HIGH (ref 20.0–28.0)
Calcium, Ion: 1.14 mmol/L — ABNORMAL LOW (ref 1.15–1.40)
Calcium, Ion: 1.09 mmol/L — ABNORMAL LOW (ref 1.15–1.40)
Calcium, Ion: 1.07 mmol/L — ABNORMAL LOW (ref 1.15–1.40)
HCT: 28 % — ABNORMAL LOW (ref 36.0–46.0)
HCT: 29 % — ABNORMAL LOW (ref 36.0–46.0)
HCT: 31 % — ABNORMAL LOW (ref 36.0–46.0)
Hemoglobin: 9.5 g/dL — ABNORMAL LOW (ref 12.0–15.0)
Hemoglobin: 9.9 g/dL — ABNORMAL LOW (ref 12.0–15.0)
Hemoglobin: 10.5 g/dL — ABNORMAL LOW (ref 12.0–15.0)
O2 Saturation: 97 %
O2 Saturation: 97 %
O2 Saturation: 93 %
Patient temperature: 98
Patient temperature: 36.8
Patient temperature: 36.5
Potassium: 4.1 mmol/L (ref 3.5–5.1)
Potassium: 4.3 mmol/L (ref 3.5–5.1)
Potassium: 5.3 mmol/L — ABNORMAL HIGH (ref 3.5–5.1)
Sodium: 143 mmol/L (ref 135–145)
Sodium: 145 mmol/L (ref 135–145)
Sodium: 143 mmol/L (ref 135–145)
TCO2: 34 mmol/L — ABNORMAL HIGH (ref 22–32)
TCO2: 36 mmol/L — ABNORMAL HIGH (ref 22–32)
TCO2: 34 mmol/L — ABNORMAL HIGH (ref 22–32)
pCO2 arterial: 58.1 mm[Hg] — ABNORMAL HIGH (ref 32–48)
pCO2 arterial: 60.2 mm[Hg] — ABNORMAL HIGH (ref 32–48)
pCO2 arterial: 72 mm[Hg] (ref 32–48)
pH, Arterial: 7.355 (ref 7.35–7.45)
pH, Arterial: 7.355 (ref 7.35–7.45)
pH, Arterial: 7.249 — ABNORMAL LOW (ref 7.35–7.45)
pO2, Arterial: 93 mm[Hg] (ref 83–108)
pO2, Arterial: 92 mm[Hg] (ref 83–108)
pO2, Arterial: 81 mm[Hg] — ABNORMAL LOW (ref 83–108)

## 2023-03-30 LAB — CBC
HCT: 31.5 % — ABNORMAL LOW (ref 36.0–46.0)
Hemoglobin: 9.4 g/dL — ABNORMAL LOW (ref 12.0–15.0)
MCH: 27 pg (ref 26.0–34.0)
MCHC: 29.8 g/dL — ABNORMAL LOW (ref 30.0–36.0)
MCV: 90.5 fL (ref 80.0–100.0)
Platelets: 276 10*3/uL (ref 150–400)
RBC: 3.48 MIL/uL — ABNORMAL LOW (ref 3.87–5.11)
RDW: 15.6 % — ABNORMAL HIGH (ref 11.5–15.5)
WBC: 14.7 10*3/uL — ABNORMAL HIGH (ref 4.0–10.5)
nRBC: 0 % (ref 0.0–0.2)

## 2023-03-30 LAB — GLUCOSE, CAPILLARY
Glucose-Capillary: 140 mg/dL — ABNORMAL HIGH (ref 70–99)
Glucose-Capillary: 164 mg/dL — ABNORMAL HIGH (ref 70–99)
Glucose-Capillary: 156 mg/dL — ABNORMAL HIGH (ref 70–99)
Glucose-Capillary: 173 mg/dL — ABNORMAL HIGH (ref 70–99)
Glucose-Capillary: 209 mg/dL — ABNORMAL HIGH (ref 70–99)
Glucose-Capillary: 182 mg/dL — ABNORMAL HIGH (ref 70–99)

## 2023-03-30 LAB — APTT: aPTT: 98 s — ABNORMAL HIGH (ref 24–36)

## 2023-03-30 LAB — BASIC METABOLIC PANEL
Anion gap: 9 (ref 5–15)
BUN: 43 mg/dL — ABNORMAL HIGH (ref 8–23)
CO2: 31 mmol/L (ref 22–32)
Calcium: 7.9 mg/dL — ABNORMAL LOW (ref 8.9–10.3)
Chloride: 103 mmol/L (ref 98–111)
Creatinine, Ser: 1.31 mg/dL — ABNORMAL HIGH (ref 0.44–1.00)
GFR, Estimated: 42 mL/min — ABNORMAL LOW (ref >60)
Glucose, Bld: 166 mg/dL — ABNORMAL HIGH (ref 70–99)
Potassium: 4.2 mmol/L (ref 3.5–5.1)
Sodium: 143 mmol/L (ref 135–145)

## 2023-03-30 LAB — OCCULT BLOOD X 1 CARD TO LAB, STOOL: Fecal Occult Bld: POSITIVE — AB

## 2023-03-30 LAB — HEPARIN LEVEL (UNFRACTIONATED): Heparin Unfractionated: 1.09 [IU]/mL — ABNORMAL HIGH (ref 0.30–0.70)

## 2023-03-30 MED ORDER — DEXAMETHASONE SODIUM PHOSPHATE 10 MG/ML IJ SOLN
20.0000 mg | Freq: Every day | INTRAMUSCULAR | Status: AC
  Administered 2023-03-31 – 2023-04-03 (×4): 20 mg via INTRAVENOUS
  Filled 2023-03-30 (×4): qty 2

## 2023-03-30 MED ORDER — VASOPRESSIN 20 UNITS/100 ML INFUSION FOR SHOCK
INTRAVENOUS | Status: AC
  Administered 2023-03-30: 0.03 [IU]/min via INTRAVENOUS
  Filled 2023-03-30: qty 100

## 2023-03-30 MED ORDER — DEXAMETHASONE SODIUM PHOSPHATE 10 MG/ML IJ SOLN
10.0000 mg | Freq: Every day | INTRAMUSCULAR | Status: DC
  Administered 2023-04-04 – 2023-04-05 (×2): 10 mg via INTRAVENOUS
  Filled 2023-03-30 (×2): qty 1

## 2023-03-30 MED ORDER — DEXAMETHASONE SODIUM PHOSPHATE 10 MG/ML IJ SOLN
14.0000 mg | Freq: Every day | INTRAMUSCULAR | Status: AC
  Administered 2023-03-30: 14 mg via INTRAVENOUS
  Filled 2023-03-30: qty 2

## 2023-03-30 MED ORDER — POTASSIUM CHLORIDE 20 MEQ PO PACK
40.0000 meq | PACK | Freq: Once | ORAL | Status: AC
  Administered 2023-03-30: 40 meq
  Filled 2023-03-30: qty 2

## 2023-03-30 MED ORDER — ACETAZOLAMIDE 250 MG PO TABS
500.0000 mg | ORAL_TABLET | Freq: Two times a day (BID) | ORAL | Status: AC
  Administered 2023-03-30: 500 mg
  Filled 2023-03-30: qty 2

## 2023-03-30 MED ORDER — VASOPRESSIN 20 UNITS/100 ML INFUSION FOR SHOCK
0.0000 [IU]/min | INTRAVENOUS | Status: DC
  Administered 2023-03-31: 0.03 [IU]/min via INTRAVENOUS
  Filled 2023-03-30: qty 100

## 2023-03-30 MED ORDER — FUROSEMIDE 10 MG/ML IJ SOLN
60.0000 mg | Freq: Three times a day (TID) | INTRAMUSCULAR | Status: AC
  Administered 2023-03-30 (×2): 60 mg via INTRAVENOUS
  Filled 2023-03-30 (×2): qty 6

## 2023-03-30 MED ORDER — NOREPINEPHRINE 16 MG/250ML-% IV SOLN
0.0000 ug/min | INTRAVENOUS | Status: DC
  Administered 2023-03-30: 13 ug/min via INTRAVENOUS
  Administered 2023-03-31: 11 ug/min via INTRAVENOUS
  Administered 2023-04-01: 5 ug/min via INTRAVENOUS
  Filled 2023-03-30 (×4): qty 250

## 2023-03-30 NOTE — Progress Notes (Signed)
NAME:  Kaylee [name], MRN:  [id number], DOB:  1945/06/29, LOS: 3 ADMISSION DATE:  03/27/2023, CONSULTATION DATE:  03/27/2023 REFERRING MD:  Elgergawy - TRH, CHIEF COMPLAINT: SOB  History of Present Illness:  77 year old woman who presented to [facility] ED 11/20 from [facility] with shortness of breath and increased oxygen requirement. PMHx significant for HTN, Afib (on Xarelto), HFpEF (Echo 09/2020 with EF 65-70%, normal LV/RV function, moderately dilated LA/RA), OSA/?OHS on 2L HOT, asthma, obesity. Recent admission to [facility] 10/3-10/9 for sepsis 2/2 cellulitis of RLE complicated by AKI; patient was discharged to [facility] to complete meropenem course (Lasix stopped at d/c).  History is obtained from chart review. Per EDP, "states that for the past 2.5 months has been feeling weak." Reportedly had positive COVID test 2 days prior to arrival with increasing O2 needs at facility prior to presentation. Denied chest pain, swelling. Labs were notable for WBC 7.1, Hgb 9.4, BUN 29, Cr 1.27, LA 1.4, VBG pH 7.3/pCO2 75, BNP 353. RVP panel + COVID and blood cultures pending. CXR demonstrated edema. On exam she was hypotensive, wheezing. Received Solumedrol, Duonebs, given 1L IVF total for ongoing hypotension. She was placed on BiPAP for ongoing work of breathing. She was initially admitted to hospitalist at [facility] who consulted PCCM for transfer and admission.   Prior to transfer, respiratory status continued to deteriorate prompting intubation.  PCCM consulted for ICU admission.  Pertinent Medical History:  Atrial fibrillation on Xarelto, CHF, obesity, asthma, obstructive sleep apnea on 2L [place] chronically  Significant Hospital Events: Including procedures, antibiotic start and stop dates in addition to other pertinent events   11/20 - Admit to PCCM, intubated at [facility] prior to transport to [facility] 11/22 prone for ARDS 11/23 no acute issues this a.m, remains on 90% FiO2  Interim History / Subjective:   Sedated on vent  Objective   Blood pressure 111/68, pulse 84, temperature (!) 96.4 F (35.8 C), resp. rate (!) 21, height 5\' 4"  (1.626 m), weight (!) 181.4 kg, SpO2 98%.    Vent Mode: PRVC FiO2 (%):  [60 %-100 %] 90 % Set Rate:  [26 bmp] 26 bmp Vt Set:  [430 mL] 430 mL PEEP:  [12 cmH20] 12 cmH20 Plateau Pressure:  [22 cmH20-24 cmH20] 24 cmH20   Intake/Output Summary (Last 24 hours) at 03/30/2023 0818 Last data filed at 03/30/2023 6962 Gross per 24 hour  Intake 3827.1 ml  Output 4255 ml  Net -427.9 ml   Filed Weights   03/27/23 2059 03/27/23 2200 03/29/23 0500  Weight: (!) 168.4 kg (!) 168.4 kg (!) 181.4 kg   Physical Examination: General: General Chronic ill-appearing morbidly obese deconditioned elderly female lying in bed on mechanical ventilation in no acute distress HEENT: ETT, MM pink/moist, PERRL,  Neuro: Sedated on vent CV: s1s2 regular rate and rhythm, no murmur, rubs, or gallops,  PULM: Diminished bilaterally, no increased work of breathing, tolerating ventilator, 90% FiO2 12 of PEEP GI: soft, bowel sounds active in all 4 quadrants, non-tender, non-distended, tolerating TF Extremities: warm/dry, no edema  Skin: no rashes or lesions  Resolved Hospital Problem List:     Assessment & Plan:  Acute-on-chronic respiratory failure with hypercarbia/hypoxia COVID-19 pneumonia with possible superimposed bacterial pneumonia Moderate ARDS -P/F ratio slightly improved to 103 indicating moderate ARDS.  FiO2 remains elevated at 90 OSA/OHS Acute respiratory acidosis Continue ventilator support with lung protective strategies  Wean PEEP and FiO2 for sats greater than 90%. Head of bed elevated 30 degrees. Plateau pressures less  than 30 cm H20.  Follow intermittent chest x-ray and ABG.   SAT/SBT as tolerated, mentation preclude extubation  Ensure adequate pulmonary hygiene  Continue IV ceftriaxone, azithromycin, empiric Follow cultures  VAP bundle in place  PAD  protocol Continue steroids Ongoing diuresis as below  Possible right lower leg cellulitis -Patient was recently treated for right lower leg cellulitis but she still has some erythema and warmth on the medial side of the thigh and lower leg P: Continue azithromycin, ceftriaxone, and vancomycin Local wound care  Gram-positive cocci bacteremia versus contamination -Patient one of the 2 blood culture bottles positive for Staphylococcus epidermidis likely contaminant.  P: Continue antibiotics as above  Sedation induced hypotension History of essential hypertension -Patient is on high amount of sedation with propofol 50 and fentanyl 400 to facilitate ventilator compliance in the setting of moderate ARDS.  P: Continue pressors for MAP goal greater than 65 Hold home antihypertensives  Continuous telemetry  Permanent atrial fibrillation anticoagulated at baseline -on diltiazem, coreg, Xarelto -CHA2DS2-VASc Score 5 P: Continue IV heparin drip Optimize electrolytes  CKD stage IIIa Hypernatremia P: Follow renal function  Monitor urine output Trend Bmet Avoid nephrotoxins Ensure adequate renal perfusion   Morbid obesity P: Continue tube feeds RD following  Anemia of critical illness P: Trend CBC Transfuse per protocol Hemoglobin goal greater than 7  Best Practice (right click and "Reselect all SmartList Selections" daily)   Diet/type: NPO trickle tube feed DVT prophylaxis: IV heparin infusion GI prophylaxis: PPI Lines: N/A Foley:  N/A Code Status:  full code Last date of multidisciplinary goals of care discussion: Family at bedside, update on arrival  CRITICAL CARE Performed by: Manan Olmo D. Harris   Total critical care time: 42 minutes  Critical care time was exclusive of separately billable procedures and treating other patients.  Critical care was necessary to treat or prevent imminent or life-threatening deterioration.  Critical care was time spent  personally by me on the following activities: development of treatment plan with patient and/or surrogate as well as nursing, discussions with consultants, evaluation of patient's response to treatment, examination of patient, obtaining history from patient or surrogate, ordering and performing treatments and interventions, ordering and review of laboratory studies, ordering and review of radiographic studies, pulse oximetry and re-evaluation of patient's condition.  Winston Sobczyk D. Harris, NP-C [place] [facility] Personal contact information can be found on Amion  If no contact or response made please call [phone] 03/30/2023, 8:31 AM

## 2023-03-30 NOTE — Plan of Care (Signed)
  Problem: Clinical Measurements: Goal: Ability to maintain clinical measurements within normal limits will improve Outcome: Progressing Goal: Will remain free from infection Outcome: Progressing Goal: Diagnostic test results will improve Outcome: Progressing Goal: Respiratory complications will improve Outcome: Progressing Goal: Cardiovascular complication will be avoided Outcome: Progressing   Problem: Pain Management: Goal: General experience of comfort will improve Outcome: Progressing   Problem: Tissue Perfusion: Goal: Adequacy of tissue perfusion will improve Outcome: Progressing

## 2023-03-30 NOTE — Progress Notes (Signed)
  Pt flipped back to supine position per protocol RN x 3 & RT x 2 present. Vitals signs are stable and pt is getting their appropriate tidal volumes at this time.

## 2023-03-30 NOTE — Progress Notes (Signed)
  Pt head turned with RT and RN x 2. No complications, vitals stable, catheter passes through.

## 2023-03-30 NOTE — Progress Notes (Signed)
Pt head turned with RT and RN x 3. No complications, vitals stable, catheter passes through.

## 2023-03-30 NOTE — Progress Notes (Signed)
PHARMACY - ANTICOAGULATION CONSULT NOTE  Pharmacy Consult for heparin Indication: atrial fibrillation  Allergies  Allergen Reactions   Tetanus Toxoid     REACTION: swelling    Patient Measurements: Height: 5\' 4"  (162.6 cm) Weight: (!) 181.4 kg (399 lb 14.6 oz) IBW/kg (Calculated) : 54.7 Heparin Dosing Weight: 98.4 kg  Vital Signs: Temp: 98.4 F (36.9 C) (11/23 0900) Temp Source: Esophageal (11/23 0800) Pulse Rate: 89 (11/23 0900)  Labs: Recent Labs    03/27/23 2352 03/28/23 0319 03/29/23 0405 03/29/23 0421 03/29/23 1500 03/29/23 1627 03/30/23 0306 03/30/23 0441 03/30/23 0829  HGB 9.6*   < > 9.6*   < >  --    < > 9.5* 9.4* 9.9*  HCT 32.4*   < > 30.7*   < >  --    < > 28.0* 31.5* 29.0*  PLT 216  --  252  --   --   --   --  276  --   APTT 28   < > 126*  --  66*  --   --  98*  --   HEPARINUNFRC >1.10*  --  >1.10*  --   --   --   --  1.09*  --   CREATININE 1.27*  --  1.24*  --   --   --   --  1.31*  --    < > = values in this interval not displayed.    Estimated Creatinine Clearance: 61.7 mL/min (A) (by C-G formula based on SCr of 1.31 mg/dL (H)).    Assessment: 42 yoF presented with respiratory failure and Covid-19 requiring mechanical ventilation. Pharmacy consulted to dose heparin for atrial fibrillation.  Per nursing home facility [facility], patient was not taking Xarelto, but baseline heparin level was >1.1, indicating that Xarelto is in patient's system.  aPTT upper limit of therapeutic at 98, HL still elevated secondary to possible Xarelto use. No new s/sx of bleeding noted per discussion with RN, CBC stable at this time.  Goals: Heparin level 0.3-0.7 units/ml aPTT 66-102 seconds Monitor platelets by anticoagulation protocol: Yes   Plan:  Continue heparin infusion at 1350 units/hr Daily HL, aPTT until correlating Monitor for s/sx bleeding, CBC as needed  Rutherford Nail, PharmD PGY2 Critical Care Pharmacy Resident 03/30/2023 9:44 AM

## 2023-03-31 DIAGNOSIS — J80 Acute respiratory distress syndrome: Secondary | ICD-10-CM | POA: Diagnosis not present

## 2023-03-31 DIAGNOSIS — N1831 Chronic kidney disease, stage 3a: Secondary | ICD-10-CM | POA: Diagnosis not present

## 2023-03-31 LAB — BASIC METABOLIC PANEL
Anion gap: 11 (ref 5–15)
BUN: 59 mg/dL — ABNORMAL HIGH (ref 8–23)
CO2: 30 mmol/L (ref 22–32)
Calcium: 7.6 mg/dL — ABNORMAL LOW (ref 8.9–10.3)
Chloride: 101 mmol/L (ref 98–111)
Creatinine, Ser: 1.48 mg/dL — ABNORMAL HIGH (ref 0.44–1.00)
GFR, Estimated: 37 mL/min — ABNORMAL LOW (ref >60)
Glucose, Bld: 185 mg/dL — ABNORMAL HIGH (ref 70–99)
Potassium: 4.3 mmol/L (ref 3.5–5.1)
Sodium: 142 mmol/L (ref 135–145)

## 2023-03-31 LAB — POCT I-STAT 7, (LYTES, BLD GAS, ICA,H+H)
Acid-Base Excess: 6 mmol/L — ABNORMAL HIGH (ref 0.0–2.0)
Acid-Base Excess: 4 mmol/L — ABNORMAL HIGH (ref 0.0–2.0)
Bicarbonate: 32.6 mmol/L — ABNORMAL HIGH (ref 20.0–28.0)
Bicarbonate: 33.7 mmol/L — ABNORMAL HIGH (ref 20.0–28.0)
Calcium, Ion: 1.08 mmol/L — ABNORMAL LOW (ref 1.15–1.40)
Calcium, Ion: 1.13 mmol/L — ABNORMAL LOW (ref 1.15–1.40)
HCT: 27 % — ABNORMAL LOW (ref 36.0–46.0)
HCT: 27 % — ABNORMAL LOW (ref 36.0–46.0)
Hemoglobin: 9.2 g/dL — ABNORMAL LOW (ref 12.0–15.0)
Hemoglobin: 9.2 g/dL — ABNORMAL LOW (ref 12.0–15.0)
O2 Saturation: 95 %
O2 Saturation: 96 %
Patient temperature: 96.7
Patient temperature: 36
Potassium: 4.4 mmol/L (ref 3.5–5.1)
Potassium: 4.3 mmol/L (ref 3.5–5.1)
Sodium: 145 mmol/L (ref 135–145)
Sodium: 145 mmol/L (ref 135–145)
TCO2: 34 mmol/L — ABNORMAL HIGH (ref 22–32)
TCO2: 36 mmol/L — ABNORMAL HIGH (ref 22–32)
pCO2 arterial: 58.5 mm[Hg] — ABNORMAL HIGH (ref 32–48)
pCO2 arterial: 79.8 mm[Hg] (ref 32–48)
pH, Arterial: 7.349 — ABNORMAL LOW (ref 7.35–7.45)
pH, Arterial: 7.228 — ABNORMAL LOW (ref 7.35–7.45)
pO2, Arterial: 76 mm[Hg] — ABNORMAL LOW (ref 83–108)
pO2, Arterial: 100 mm[Hg] (ref 83–108)

## 2023-03-31 LAB — CBC
HCT: 28.3 % — ABNORMAL LOW (ref 36.0–46.0)
Hemoglobin: 8.4 g/dL — ABNORMAL LOW (ref 12.0–15.0)
MCH: 27.2 pg (ref 26.0–34.0)
MCHC: 29.7 g/dL — ABNORMAL LOW (ref 30.0–36.0)
MCV: 91.6 fL (ref 80.0–100.0)
Platelets: 223 10*3/uL (ref 150–400)
RBC: 3.09 MIL/uL — ABNORMAL LOW (ref 3.87–5.11)
RDW: 15.5 % (ref 11.5–15.5)
WBC: 12.6 10*3/uL — ABNORMAL HIGH (ref 4.0–10.5)
nRBC: 0.3 % — ABNORMAL HIGH (ref 0.0–0.2)

## 2023-03-31 LAB — GLUCOSE, CAPILLARY
Glucose-Capillary: 180 mg/dL — ABNORMAL HIGH (ref 70–99)
Glucose-Capillary: 178 mg/dL — ABNORMAL HIGH (ref 70–99)
Glucose-Capillary: 157 mg/dL — ABNORMAL HIGH (ref 70–99)
Glucose-Capillary: 156 mg/dL — ABNORMAL HIGH (ref 70–99)
Glucose-Capillary: 183 mg/dL — ABNORMAL HIGH (ref 70–99)
Glucose-Capillary: 179 mg/dL — ABNORMAL HIGH (ref 70–99)

## 2023-03-31 LAB — HEPARIN LEVEL (UNFRACTIONATED): Heparin Unfractionated: 0.35 [IU]/mL (ref 0.30–0.70)

## 2023-03-31 LAB — TRIGLYCERIDES: Triglycerides: 104 mg/dL (ref <150)

## 2023-03-31 LAB — APTT: aPTT: 27 s (ref 24–36)

## 2023-03-31 MED ORDER — PANTOPRAZOLE SODIUM 40 MG IV SOLR
40.0000 mg | Freq: Two times a day (BID) | INTRAVENOUS | Status: DC
  Administered 2023-03-31 – 2023-04-01 (×3): 40 mg via INTRAVENOUS
  Filled 2023-03-31 (×3): qty 10

## 2023-03-31 MED ORDER — GERHARDT'S BUTT CREAM
TOPICAL_CREAM | Freq: Three times a day (TID) | CUTANEOUS | Status: DC
  Administered 2023-04-02: 1 via TOPICAL
  Filled 2023-03-31 (×2): qty 1

## 2023-03-31 NOTE — Consult Note (Signed)
WOC Nurse Consult Note: this consult performed remotely with assistance of bedside nurse; appreciate H. Richard, RN assistance with this consult  Reason for Consult: Moisture associated skin damage  Wound type: 1.  Moisture Associated Skin Damage to groin buttocks 2. Intertriginous dermatitis underneath breasts  ICD-10 CM Codes for Irritant Dermatitis L24A2 - Due to fecal, urinary or dual incontinence  L30.4  - Erythema intertrigo. Also used for abrasion of the hand, chafing of the skin, dermatitis due to sweating and friction, friction dermatitis, friction eczema, and genital/thigh intertrigo.  Pressure Injury POA: NA  Measurement: see nursing flowsheet  Wound bed: widespread erythema with scattered partial thickness skin loss underneath breasts, buttocks and groin  Drainage (amount, consistency, odor) minimal serosanguinous  Periwound: erythema  Dressing procedure/placement/frequency: Cleanse buttocks/groin/inner thighs with Vashe wound cleanser Hart Rochester [phone]), apply Gerhardt's Butt Cream to area 3 times a day and prn soiling.  Sprinkle over Gerhardt's with floor stock antifungal powder (white and green labe Microguard).  Clean underneath breasts with Vashe wound cleanser Hart Rochester [phone]), apply Interdry as follows: Order Hart Rochester # [phone] Measure and cut length of InterDry to fit in skin folds that have skin breakdown Tuck InterDry fabric into skin folds in a single layer, allow for 2 inches of overhang from skin edges to allow for wicking to occur May remove to bathe; dry area thoroughly and then tuck into affected areas again Do not apply any creams or ointments when using InterDry DO NOT THROW AWAY FOR 5 DAYS unless soiled with stool DO NOT [facility] product, this will inactivate the silver in the material  New sheet of Interdry should be applied after 5 days of use if patient continues to have skin breakdown   POC discussed with bedside nurse. WOC team will not follow. Re-consult if further  needs arise.   Thank you,    Priscella Mann MSN, RN-BC, [employer] [phone]

## 2023-03-31 NOTE — Progress Notes (Signed)
PHARMACY - ANTICOAGULATION CONSULT NOTE  Pharmacy Consult for heparin Indication: atrial fibrillation  Allergies  Allergen Reactions   Tetanus Toxoid     REACTION: swelling    Patient Measurements: Height: 5\' 4"  (162.6 cm) Weight: (!) 177.7 kg (391 lb 12.1 oz) IBW/kg (Calculated) : 54.7 Heparin Dosing Weight: 98.4 kg  Vital Signs: Temp: 98.1 F (36.7 C) (11/24 1000) BP: 137/58 (11/24 0809) Pulse Rate: 95 (11/24 1000)  Labs: Recent Labs    03/29/23 0405 03/29/23 0421 03/29/23 1500 03/29/23 1627 03/30/23 0441 03/30/23 0829 03/30/23 1826 03/31/23 0216 03/31/23 0311  HGB 9.6*   < >  --    < > 9.4*   < > 10.5* 9.2* 8.4*  HCT 30.7*   < >  --    < > 31.5*   < > 31.0* 27.0* 28.3*  PLT 252  --   --   --  276  --   --   --  223  APTT 126*  --  66*  --  98*  --   --   --  27  HEPARINUNFRC >1.10*  --   --   --  1.09*  --   --   --  0.35  CREATININE 1.24*  --   --   --  1.31*  --   --   --  1.48*   < > = values in this interval not displayed.    Estimated Creatinine Clearance: 53.9 mL/min (A) (by C-G formula based on SCr of 1.48 mg/dL (H)).    Assessment: 54 yoF presented with respiratory failure and Covid-19 requiring mechanical ventilation. Pharmacy consulted to dose heparin for atrial fibrillation.  Per nursing home facility [facility], patient was not taking Xarelto, but baseline heparin level was >1.1, indicating that Xarelto is in patient's system.  Heparin infusion was previously therapeutic at 1350 u/h. Per discussion with nursing pt had findings of bloody output in FMS so heparin was held 1656. Positive occult stool.  Goals: Heparin level 0.3-0.7 units/ml aPTT 66-102 seconds Monitor platelets by anticoagulation protocol: Yes   Plan:  Discussed with CCM, will hold until 11/25 and reevaluate bleeding IV PPI BID initiated Monitor for additional s/sx bleeding, CBC as needed  Rutherford Nail, PharmD PGY2 Critical Care Pharmacy Resident 03/31/2023 10:39 AM

## 2023-03-31 NOTE — IPAL (Signed)
  Interdisciplinary Goals of Care Family Meeting   Date carried out:: 03/31/2023  Location of the meeting: Conference room  Member's involved: Physician and Family Member or next of kin  Durable Power of Attorney or acting medical decision maker: Patient's husband Kaylee [name]  Discussion: We discussed goals of care for Kaylee [name] .  Discussed the patient's plan of care, current status, prognosis with multiple family members at bedside including [name].  They have discussed her care and would like to change her CODE STATUS to DNR in the event of an arrest.  They agree with continuing all aggressive support prearrest.  I will make the appropriate change in the orders.  Code status: Full DNR  Disposition: Continue current acute care   Time spent for the meeting: 15 min  Leslye Peer 03/31/2023, 7:01 PM

## 2023-03-31 NOTE — Progress Notes (Signed)
Latest Reference Range & Units 03/31/23 16:31  pH, Arterial 7.35 - 7.45  7.228 (L)  pCO2 arterial 32 - 48 mmHg 79.8 (HH)  pO2, Arterial 83 - 108 mmHg 100  TCO2 22 - 32 mmol/L 36 (H)  Acid-Base Excess 0.0 - 2.0 mmol/L 4.0 (H)  Bicarbonate 20.0 - 28.0 mmol/L 33.7 (H)  O2 Saturation % 96  Patient temperature  36.0 C  Collection site  art line   Critical ABG results given to CCM. No changes at this time.

## 2023-03-31 NOTE — Progress Notes (Signed)
NAME:  Kaylee [name], MRN:  [id number], DOB:  [birth year], LOS: 4 ADMISSION DATE:  03/27/2023, CONSULTATION DATE:  03/27/2023 REFERRING MD:  Elgergawy - TRH, CHIEF COMPLAINT: SOB  History of Present Illness:  77 year old woman who presented to [facility] ED 11/20 from [facility] with shortness of breath and increased oxygen requirement. PMHx significant for HTN, Afib (on Xarelto), HFpEF (Echo 09/2020 with EF 65-70%, normal LV/RV function, moderately dilated LA/RA), OSA/?OHS on 2L HOT, asthma, obesity. Recent admission to [facility] 10/3-10/9 for sepsis 2/2 cellulitis of RLE complicated by AKI; patient was discharged to [facility] to complete meropenem course (Lasix stopped at d/c).  History is obtained from chart review. Per EDP, "states that for the past 2.5 months has been feeling weak." Reportedly had positive COVID test 2 days prior to arrival with increasing O2 needs at facility prior to presentation. Denied chest pain, swelling. Labs were notable for WBC 7.1, Hgb 9.4, BUN 29, Cr 1.27, LA 1.4, VBG pH 7.3/pCO2 75, BNP 353. RVP panel + COVID and blood cultures pending. CXR demonstrated edema. On exam she was hypotensive, wheezing. Received Solumedrol, Duonebs, given 1L IVF total for ongoing hypotension. She was placed on BiPAP for ongoing work of breathing. She was initially admitted to hospitalist at [facility] who consulted PCCM for transfer and admission.   Prior to transfer, respiratory status continued to deteriorate prompting intubation.  PCCM consulted for ICU admission.  Pertinent Medical History:  Atrial fibrillation on Xarelto, CHF, obesity, asthma, obstructive sleep apnea on 2L  chronically  Significant Hospital Events: Including procedures, antibiotic start and stop dates in addition to other pertinent events   11/20 - Admit to PCCM, intubated at [facility] prior to transport to [facility] 11/22 prone for ARDS 11/23 Remains on 90% FiO2, concern for acute GI bleed overnight with red-colored stool  in Flexi-Seal Hemoccult positive 11/24 hemoglobin remained stable this a.m.  Interim History / Subjective:  No acute issues overnight  Objective   Blood pressure 111/68, pulse 86, temperature 98.6 F (37 C), resp. rate 10, height 5\' 4"  (1.626 m), weight (!) 177.7 kg, SpO2 97%.    Vent Mode: PRVC FiO2 (%):  [90 %] 90 % Set Rate:  [26 bmp] 26 bmp Vt Set:  [430 mL] 430 mL PEEP:  [12 cmH20] 12 cmH20 Plateau Pressure:  [21 cmH20-26 cmH20] 23 cmH20   Intake/Output Summary (Last 24 hours) at 03/31/2023 4098 Last data filed at 03/31/2023 0600 Gross per 24 hour  Intake 2913.85 ml  Output 4070 ml  Net -1156.15 ml   Filed Weights   03/27/23 2200 03/29/23 0500 03/31/23 0500  Weight: (!) 168.4 kg (!) 181.4 kg (!) 177.7 kg   Physical Examination: General: Acute on chronic ill-appearing elderly morbidly obese female lying in bed on mechanical ventilation no acute distress HEENT: ETT, MM pink/moist, PERRL,  Neuro: Sedated on vent CV: s1s2 regular rate and rhythm, no murmur, rubs, or gallops,  PULM: Diminished breath sounds bilaterally, no increased work of breathing, no added breath sounds GI: soft, bowel sounds active in all 4 quadrants, non-tender, non-distended, tolerating TF Extremities: warm/dry, no edema  Skin: no rashes or lesions  Resolved Hospital Problem List:     Assessment & Plan:  Acute-on-chronic respiratory failure with hypercarbia/hypoxia COVID-19 pneumonia with possible superimposed bacterial pneumonia Severe ARDS -P/F ratio slightly worsened again to 84.4 now meeting criteria for severe ARDS, FiO2 remains 90% OSA/OHS Acute respiratory acidosis P: Continue ventilator support with lung protective strategies  Wean PEEP and FiO2 for sats  greater than 90%. Decrease to 7 cc Head of bed elevated 30 degrees. Plateau pressures less than 30 cm H20.  Follow intermittent chest x-ray and ABG.   SAT/SBT as tolerated, mentation preclude extubation  Ensure adequate pulmonary  hygiene  Follow cultures  VAP bundle in place  PAD protocol Continue diuresis able Continue steroids  Concern for acute GI bleed -Bright red stools seen in Flexi-Seal seen afternoon 11/23 prompting heparin drip to be stopped -Occult stool positive P: Hold heparin drip today can likely rechallenge tomorrow  Trend CBC Twice daily PPI Monitor for signs of bleeding  Possible right lower leg cellulitis -Patient was recently treated for right lower leg cellulitis but she still has some erythema and warmth on the medial side of the thigh and lower leg P: Continue azithromycin, ceftriaxone, and vancomycin Local wound care  Gram-positive cocci bacteremia versus contamination -Patient one of the 2 blood culture bottles positive for Staphylococcus epidermidis likely contaminant.  P: Continue antibiotics as above  Sedation induced hypotension History of essential hypertension -Patient is on high amount of sedation with propofol 50 and fentanyl 400 to facilitate ventilator compliance in the setting of moderate ARDS.  P: Continue pressors for MAP goal greater than 65 Hold home medications Continuous telemetry  Permanent atrial fibrillation anticoagulated at baseline -on diltiazem, coreg, Xarelto -CHA2DS2-VASc Score 5 P: Heparin drip remains on hold given concern for GI bleed, counts remain stable can likely rechallenge tomorrow morning Optimize electrolytes  CKD stage IIIa Hypernatremia P: Follow renal function Monitor urine output Trend Bement Avoid nephrotoxins Ensure adequate renal perfusion Hold further diuretics today  Morbid obesity P: Continue tube feeds RD following  Anemia of critical illness P: Trend CBC Transfuse per protocol Hemoglobin goal greater than 7  Best Practice (right click and "Reselect all SmartList Selections" daily)   Diet/type: NPO trickle tube feed DVT prophylaxis: IV heparin infusion GI prophylaxis: PPI Lines: N/A Foley:  N/A Code  Status:  full code Last date of multidisciplinary goals of care discussion: Family at bedside, update on arrival  CRITICAL CARE Performed by: Jamarl Pew D. Harris   Total critical care time: 42 minutes  Critical care time was exclusive of separately billable procedures and treating other patients.  Critical care was necessary to treat or prevent imminent or life-threatening deterioration.  Critical care was time spent personally by me on the following activities: development of treatment plan with patient and/or surrogate as well as nursing, discussions with consultants, evaluation of patient's response to treatment, examination of patient, obtaining history from patient or surrogate, ordering and performing treatments and interventions, ordering and review of laboratory studies, ordering and review of radiographic studies, pulse oximetry and re-evaluation of patient's condition.  Tal Kempker D. Harris, NP-C [facility] Personal contact information can be found on Amion  If no contact or response made please call [phone] 03/31/2023, 7:11 AM

## 2023-03-31 NOTE — Procedures (Addendum)
Central Venous Catheter Insertion Procedure Note  Kaylee [name]  [id number]  12/20/1945  Date:03/31/23  Time:12:43 PM   Provider Performing:Gwendlyon Zumbro D. Harris   Procedure: Insertion of Non-tunneled Central Venous [id number]) with US guidance (56213)    Indication(s) Medication administration and Difficult access  Consent Risks of the procedure as well as the alternatives and risks of each were explained to the patient and/or caregiver.  Consent for the procedure was obtained and is signed in the bedside chart  Anesthesia Topical only with 1% lidocaine   Timeout Verified patient identification, verified procedure, site/side was marked, verified correct patient position, special equipment/implants available, medications/allergies/relevant history reviewed, required imaging and test results available.  Sterile Technique Maximal sterile technique including full sterile barrier drape, hand hygiene, sterile gown, sterile gloves, mask, hair covering, sterile ultrasound probe cover (if used).  Procedure Description Area of catheter insertion was cleaned with chlorhexidine and draped in sterile fashion.  With real-time ultrasound guidance a central venous catheter was placed into the left internal jugular vein. Nonpulsatile blood flow and easy flushing noted in all ports.  The catheter was sutured in place and sterile dressing applied.  Complications/Tolerance None; patient tolerated the procedure well. Chest X-ray is ordered to verify placement for internal jugular or subclavian cannulation.   Chest x-ray is not ordered for femoral cannulation.  EBL Minimal  Specimen(s) None  Chaseton Yepiz D. Harris, NP-C [facility] Personal contact information can be found on Amion  If no contact or response made please call [phone] 03/31/2023, 12:44 PM

## 2023-03-31 NOTE — Plan of Care (Signed)
  Problem: Clinical Measurements: Goal: Ability to maintain clinical measurements within normal limits will improve Outcome: Progressing Goal: Will remain free from infection Outcome: Progressing Goal: Diagnostic test results will improve Outcome: Progressing Goal: Respiratory complications will improve Outcome: Progressing Goal: Cardiovascular complication will be avoided Outcome: Progressing   Problem: Nutrition: Goal: Adequate nutrition will be maintained Outcome: Progressing   Problem: Pain Management: Goal: General experience of comfort will improve Outcome: Progressing   Problem: Safety: Goal: Ability to remain free from injury will improve Outcome: Progressing   Problem: Metabolic: Goal: Ability to maintain appropriate glucose levels will improve Outcome: Progressing

## 2023-04-01 DIAGNOSIS — U071 COVID-19: Secondary | ICD-10-CM | POA: Diagnosis not present

## 2023-04-01 DIAGNOSIS — A419 Sepsis, unspecified organism: Secondary | ICD-10-CM | POA: Diagnosis not present

## 2023-04-01 DIAGNOSIS — J80 Acute respiratory distress syndrome: Secondary | ICD-10-CM | POA: Diagnosis not present

## 2023-04-01 DIAGNOSIS — N179 Acute kidney failure, unspecified: Secondary | ICD-10-CM

## 2023-04-01 DIAGNOSIS — I4891 Unspecified atrial fibrillation: Secondary | ICD-10-CM

## 2023-04-01 LAB — POCT I-STAT 7, (LYTES, BLD GAS, ICA,H+H)
Acid-Base Excess: 4 mmol/L — ABNORMAL HIGH (ref 0.0–2.0)
Bicarbonate: 31.5 mmol/L — ABNORMAL HIGH (ref 20.0–28.0)
Calcium, Ion: 1.18 mmol/L (ref 1.15–1.40)
HCT: 27 % — ABNORMAL LOW (ref 36.0–46.0)
Hemoglobin: 9.2 g/dL — ABNORMAL LOW (ref 12.0–15.0)
O2 Saturation: 96 %
Patient temperature: 97.9
Potassium: 4.2 mmol/L (ref 3.5–5.1)
Sodium: 144 mmol/L (ref 135–145)
TCO2: 33 mmol/L — ABNORMAL HIGH (ref 22–32)
pCO2 arterial: 62.5 mm[Hg] — ABNORMAL HIGH (ref 32–48)
pH, Arterial: 7.308 — ABNORMAL LOW (ref 7.35–7.45)
pO2, Arterial: 91 mm[Hg] (ref 83–108)

## 2023-04-01 LAB — BASIC METABOLIC PANEL
Anion gap: 11 (ref 5–15)
BUN: 86 mg/dL — ABNORMAL HIGH (ref 8–23)
CO2: 30 mmol/L (ref 22–32)
Calcium: 7.9 mg/dL — ABNORMAL LOW (ref 8.9–10.3)
Chloride: 102 mmol/L (ref 98–111)
Creatinine, Ser: 1.67 mg/dL — ABNORMAL HIGH (ref 0.44–1.00)
GFR, Estimated: 32 mL/min — ABNORMAL LOW (ref >60)
Glucose, Bld: 153 mg/dL — ABNORMAL HIGH (ref 70–99)
Potassium: 4.2 mmol/L (ref 3.5–5.1)
Sodium: 143 mmol/L (ref 135–145)

## 2023-04-01 LAB — CBC
HCT: 29.8 % — ABNORMAL LOW (ref 36.0–46.0)
Hemoglobin: 8.7 g/dL — ABNORMAL LOW (ref 12.0–15.0)
MCH: 27.4 pg (ref 26.0–34.0)
MCHC: 29.2 g/dL — ABNORMAL LOW (ref 30.0–36.0)
MCV: 93.7 fL (ref 80.0–100.0)
Platelets: 219 10*3/uL (ref 150–400)
RBC: 3.18 MIL/uL — ABNORMAL LOW (ref 3.87–5.11)
RDW: 15.3 % (ref 11.5–15.5)
WBC: 12.5 10*3/uL — ABNORMAL HIGH (ref 4.0–10.5)
nRBC: 0.2 % (ref 0.0–0.2)

## 2023-04-01 LAB — CULTURE, BLOOD (ROUTINE X 2): Culture: NO GROWTH

## 2023-04-01 LAB — CULTURE, RESPIRATORY W GRAM STAIN

## 2023-04-01 LAB — GLUCOSE, CAPILLARY
Glucose-Capillary: 162 mg/dL — ABNORMAL HIGH (ref 70–99)
Glucose-Capillary: 141 mg/dL — ABNORMAL HIGH (ref 70–99)
Glucose-Capillary: 149 mg/dL — ABNORMAL HIGH (ref 70–99)
Glucose-Capillary: 147 mg/dL — ABNORMAL HIGH (ref 70–99)
Glucose-Capillary: 186 mg/dL — ABNORMAL HIGH (ref 70–99)

## 2023-04-01 LAB — HEPARIN LEVEL (UNFRACTIONATED): Heparin Unfractionated: 0.21 [IU]/mL — ABNORMAL LOW (ref 0.30–0.70)

## 2023-04-01 LAB — APTT: aPTT: 24 s (ref 24–36)

## 2023-04-01 MED ORDER — PANTOPRAZOLE SODIUM 40 MG IV SOLR
40.0000 mg | INTRAVENOUS | Status: DC
Start: 2023-04-02 — End: 2023-04-05
  Administered 2023-04-02 – 2023-04-05 (×4): 40 mg via INTRAVENOUS
  Filled 2023-04-01 (×4): qty 10

## 2023-04-01 MED ORDER — STERILE WATER FOR INJECTION IJ SOLN
INTRAMUSCULAR | Status: AC
  Administered 2023-04-01: 10 mL
  Filled 2023-04-01: qty 10

## 2023-04-01 MED ORDER — PROSOURCE TF20 ENFIT COMPATIBL EN LIQD
60.0000 mL | Freq: Three times a day (TID) | ENTERAL | Status: DC
  Administered 2023-04-01 – 2023-04-04 (×12): 60 mL
  Filled 2023-04-01 (×12): qty 60

## 2023-04-01 MED ORDER — QUETIAPINE FUMARATE 50 MG PO TABS
50.0000 mg | ORAL_TABLET | Freq: Two times a day (BID) | ORAL | Status: DC
  Administered 2023-04-01 – 2023-04-04 (×8): 50 mg
  Filled 2023-04-01 (×8): qty 1

## 2023-04-01 MED ORDER — FUROSEMIDE 10 MG/ML IJ SOLN
60.0000 mg | Freq: Once | INTRAMUSCULAR | Status: AC
  Administered 2023-04-01: 60 mg via INTRAVENOUS
  Filled 2023-04-01: qty 6

## 2023-04-01 MED ORDER — CLONAZEPAM 0.5 MG PO TABS
0.5000 mg | ORAL_TABLET | Freq: Two times a day (BID) | ORAL | Status: DC
  Administered 2023-04-01 – 2023-04-03 (×6): 0.5 mg
  Filled 2023-04-01 (×6): qty 1

## 2023-04-01 MED ORDER — VITAL AF 1.2 CAL PO LIQD
1000.0000 mL | ORAL | Status: DC
  Administered 2023-04-01 – 2023-04-04 (×5): 1000 mL

## 2023-04-01 NOTE — Progress Notes (Signed)
Nutrition Follow-up  DOCUMENTATION CODES:   Morbid obesity  INTERVENTION:   Continue tube feeding via OG tube: - Increase rate of Vital AF 1.2 to 30 ml/hr and continue to advance rate by 10 ml every 8 hours to goal rate of 45 ml/hr (1080 ml/day) - PROSource TF20 60 ml TID  Tube feeding regimen provides 1536 kcal, 141 grams of protein, and 876 ml of H2O.   - Pt receiving additional lipid kcal from propofol  - Continue renal MVI daily per tube  NUTRITION DIAGNOSIS:   Inadequate oral intake related to acute illness, inability to eat as evidenced by NPO status.  Ongoing, being addressed via tube feeding  GOAL:   Patient will meet greater than or equal to 90% of their needs  Progressing with advancement of tube feeding to goal rate  MONITOR:   I & O's, Vent status, Labs, Skin, TF tolerance  REASON FOR ASSESSMENT:   Consult Enteral/tube feeding initiation and management (increase TF regimen to goal)  ASSESSMENT:   64F PMH HTN, CKD stg IIIa, Afib, asthma, obesity, CHF, obesity, asthma, OSA and chronic respiratory failure on 2L is admitted for acute on chronic hypoxemic respiratory failure.  11/21 - trickle TF initiated  Pt admitted with acute on chronic hypoxic respiratory failure due to severe ARDS in the setting of COVID-19 pneumonia. Pt has been proned intermittently.  Discussed pt with RN and during ICU rounds. Trickle tube feeds infusing via OG tube. Consult received to advance tube feeding regimen to goal rate. Discussed plan with RN.  Pt with deep pitting generalized edema, deep pitting edema to BUE, very deep pitting edema to BLE, deep pitting facial edema, and deep pitting edema to perineum and sacrum.  Admit weight: 171.8 kg Current weight: 184 kg  Current TF: Vital AF 1.2 @ 20 ml/hr, PROSource TF20 60 ml 5 x daily  Patient remains intubated on ventilator support Temp (24hrs), Avg:97.1 F (36.2 C), Min:95.5 F (35.3 C), Max:98.5 F (36.9  C)  Drips: Propofol: 5 mcg/kg/min (provides 136 kcal daily from lipid) Fentanyl Levophed: 5 mcg/min  Medications reviewed and include: IV decadron, colace, SSI every 4 hours, rena-vit, IV protonix, miralax, IV abx  Labs reviewed: BUN 86, creatinine 1.67, ionized calcium 1.13, WBC 12.5, hemoglobin 8.7 CBG's: 141-183 x 24 hours  UOP: 1890 ml x 24 hours FMS: 170 ml x 24 hours I/O's: -2.8 L since admit  Diet Order:   Diet Order             Diet NPO time specified  Diet effective now                   EDUCATION NEEDS:   Not appropriate for education at this time  Skin:  Skin Assessment: Skin Integrity Issues: Other: MASD to groin and buttocks, intertriginous dermatitis underneath breasts  Last BM:  04/01/23 FMS  Height:   Ht Readings from Last 1 Encounters:  04/01/23 5\' 4"  (1.626 m)    Weight:   Wt Readings from Last 1 Encounters:  04/01/23 (!) 184 kg    Ideal Body Weight:  54.55 kg  BMI:  Body mass index is 69.63 kg/m.  Estimated Nutritional Needs:   Kcal:  1400-1600 kcal  Protein:  120-140 gm  Fluid:  >1.2L    Mertie Clause, MS, RD, LDN Registered Dietitian II Please see AMiON for contact information.

## 2023-04-01 NOTE — Progress Notes (Signed)
0730 patient responds to pain and voice doesn't follow commands on sedation pain medication and pressors. Patient total care vented with central line PIVS, FMS and foley 1000 pressors increased per orders. Wound care completed, Tube feeding increased 1100 stopped sedation with vent changes. ABG improving  1300 sedation restarted patient not synching with vent biting on tube  CC dr aware sedation restarted. 1630 TF at goal 45 ml hour patient tolerating well pressors at minium sedation at minium, pain medication remains the same.

## 2023-04-01 NOTE — Progress Notes (Signed)
PHARMACY - ANTICOAGULATION CONSULT NOTE  Pharmacy Consult for heparin Indication: atrial fibrillation  Allergies  Allergen Reactions   Tetanus Toxoid     REACTION: swelling    Patient Measurements: Height: 5\' 4"  (162.6 cm) Weight: (!) 184 kg (405 lb 10.3 oz) IBW/kg (Calculated) : 54.7 Heparin Dosing Weight: 98.4 kg  Vital Signs: Temp: 97.8 F (36.6 C) (11/25 0735) Temp Source: Axillary (11/25 0735) Pulse Rate: 95 (11/25 1000)  Labs: Recent Labs    03/30/23 0441 03/30/23 0829 03/31/23 0311 03/31/23 1631 04/01/23 0420  HGB 9.4*   < > 8.4* 9.2* 8.7*  HCT 31.5*   < > 28.3* 27.0* 29.8*  PLT 276  --  223  --  219  APTT 98*  --  27  --  24  HEPARINUNFRC 1.09*  --  0.35  --  0.21*  CREATININE 1.31*  --  1.48*  --  1.67*   < > = values in this interval not displayed.    Estimated Creatinine Clearance: 48.9 mL/min (A) (by C-G formula based on SCr of 1.67 mg/dL (H)).    Assessment: 44 yoF presented with respiratory failure and Covid-19 requiring mechanical ventilation. Pharmacy consulted to dose heparin for atrial fibrillation.  Per nursing home facility [facility], patient was not taking Xarelto, but baseline heparin level was >1.1, indicating that Xarelto is in patient's system.  Heparin level 0.21, subtherapeutic Heparin infusion held since 11/23 due to BRBPR  Goals: Heparin level 0.3-0.7 units/ml aPTT 66-102 seconds Monitor platelets by anticoagulation protocol: Yes   Plan:  Discussed with CCM, will continue holding heparin infusion until 11/26 and reevaluate bleeding Monitor for additional s/sx bleeding, CBC as needed  Wilburn Cornelia, PharmD, BCPS Clinical Pharmacist 04/01/2023 11:15 AM   Please refer to AMION for pharmacy phone number

## 2023-04-01 NOTE — Plan of Care (Signed)
  Problem: Health Behavior/Discharge Planning: Goal: Ability to manage health-related needs will improve Outcome: Not Progressing   Problem: Clinical Measurements: Goal: Ability to maintain clinical measurements within normal limits will improve Outcome: Not Progressing Goal: Diagnostic test results will improve Outcome: Not Progressing Goal: Respiratory complications will improve Outcome: Not Progressing Goal: Cardiovascular complication will be avoided Outcome: Not Progressing   Problem: Activity: Goal: Risk for activity intolerance will decrease Outcome: Not Progressing   Problem: Nutrition: Goal: Adequate nutrition will be maintained Outcome: Not Progressing

## 2023-04-01 NOTE — Plan of Care (Signed)
  Problem: Education: Goal: Knowledge of General Education information will improve Description: Including pain rating scale, medication(s)/side effects and non-pharmacologic comfort measures Outcome: Not Progressing   Problem: Health Behavior/Discharge Planning: Goal: Ability to manage health-related needs will improve Outcome: Not Progressing   Problem: Clinical Measurements: Goal: Ability to maintain clinical measurements within normal limits will improve Outcome: Not Progressing Goal: Will remain free from infection Outcome: Not Progressing Goal: Diagnostic test results will improve Outcome: Not Progressing Goal: Respiratory complications will improve Outcome: Progressing Note: Vent 02 decreased patients CO2 coming down Goal: Cardiovascular complication will be avoided Outcome: Not Progressing   Problem: Activity: Goal: Risk for activity intolerance will decrease Outcome: Not Progressing   Problem: Nutrition: Goal: Adequate nutrition will be maintained Outcome: Progressing Note: Tolerating the increase in tube feedings   Problem: Coping: Goal: Level of anxiety will decrease Outcome: Not Progressing   Problem: Elimination: Goal: Will not experience complications related to bowel motility Outcome: Not Progressing Goal: Will not experience complications related to urinary retention Outcome: Not Progressing   Problem: Pain Management: Goal: General experience of comfort will improve Outcome: Progressing Note: With pain meds   Problem: Safety: Goal: Ability to remain free from injury will improve Outcome: Not Progressing   Problem: Skin Integrity: Goal: Risk for impaired skin integrity will decrease Outcome: Not Progressing   Problem: Education: Goal: Knowledge of risk factors and measures for prevention of condition will improve Outcome: Not Progressing   Problem: Coping: Goal: Psychosocial and spiritual needs will be supported Outcome: Not Progressing    Problem: Safety: Goal: Non-violent Restraint(s) Outcome: Completed/Met   Problem: Education: Goal: Ability to describe self-care measures that may prevent or decrease complications (Diabetes Survival Skills Education) will improve Outcome: Not Progressing Goal: Individualized Educational Video(s) Outcome: Not Progressing   Problem: Coping: Goal: Ability to adjust to condition or change in health will improve Outcome: Not Progressing   Problem: Health Behavior/Discharge Planning: Goal: Ability to identify and utilize available resources and services will improve Outcome: Not Progressing Goal: Ability to manage health-related needs will improve Outcome: Not Progressing   Problem: Fluid Volume: Goal: Ability to maintain a balanced intake and output will improve Outcome: Not Progressing   Problem: Metabolic: Goal: Ability to maintain appropriate glucose levels will improve Outcome: Progressing   Problem: Nutritional: Goal: Maintenance of adequate nutrition will improve Outcome: Progressing Goal: Progress toward achieving an optimal weight will improve Outcome: Not Progressing   Problem: Skin Integrity: Goal: Risk for impaired skin integrity will decrease Outcome: Not Progressing   Problem: Tissue Perfusion: Goal: Adequacy of tissue perfusion will improve Outcome: Not Progressing

## 2023-04-01 NOTE — Progress Notes (Signed)
NAME:  Kaylee [name], MRN:  [id number], DOB:  1946-02-05, LOS: 5 ADMISSION DATE:  03/27/2023, CONSULTATION DATE:  03/27/2023 REFERRING MD:  Elgergawy - TRH, CHIEF COMPLAINT: SOB  History of Present Illness:  77 year old woman who presented to [facility] ED 11/20 from [facility] with shortness of breath and increased oxygen requirement. PMHx significant for HTN, Afib (on Xarelto), HFpEF (Echo 09/2020 with EF 65-70%, normal LV/RV function, moderately dilated LA/RA), OSA/?OHS on 2L HOT, asthma, obesity. Recent admission to [facility] 10/3-10/9 for sepsis 2/2 cellulitis of RLE complicated by AKI; patient was discharged to [facility] to complete meropenem course (Lasix stopped at d/c).  History is obtained from chart review. Per EDP, "states that for the past 2.5 months has been feeling weak." Reportedly had positive COVID test 2 days prior to arrival with increasing O2 needs at facility prior to presentation. Denied chest pain, swelling. Labs were notable for WBC 7.1, Hgb 9.4, BUN 29, Cr 1.27, LA 1.4, VBG pH 7.3/pCO2 75, BNP 353. RVP panel + COVID and blood cultures pending. CXR demonstrated edema. On exam she was hypotensive, wheezing. Received Solumedrol, Duonebs, given 1L IVF total for ongoing hypotension. She was placed on BiPAP for ongoing work of breathing. She was initially admitted to hospitalist at [facility] who consulted PCCM for transfer and admission.   Prior to transfer, respiratory status continued to deteriorate prompting intubation.  PCCM consulted for ICU admission.  Pertinent Medical History:  Atrial fibrillation on Xarelto, CHF, obesity, asthma, obstructive sleep apnea on 2L [place] chronically  Significant Hospital Events: Including procedures, antibiotic start and stop dates in addition to other pertinent events   11/20 - Admit to PCCM, intubated at [facility] prior to transport to [facility] 11/22 prone for ARDS 11/23 Remains on 90% FiO2, concern for acute GI bleed overnight with red-colored stool  in Flexi-Seal Hemoccult positive 11/24 hemoglobin remained stable this a.m. prone ventilation discontinued. 11/24 family discussion with Dr. Delton Coombes: CODE STATUS changed to DNR.  Interim History / Subjective:  Prone ventilation discontinued.  Remains in hypercarbic respiratory failure.  Blood in stools.  Weaning FiO2.  Objective   Blood pressure (!) 115/52, pulse 95, temperature 97.8 F (36.6 C), temperature source Axillary, resp. rate (!) 26, height 5\' 4"  (1.626 m), weight (!) 184 kg, SpO2 100%.    Vent Mode: PRVC FiO2 (%):  [80 %-90 %] 80 % Set Rate:  [26 bmp] 26 bmp Vt Set:  [380 mL-430 mL] 430 mL PEEP:  [12 cmH20-14 cmH20] 12 cmH20 Plateau Pressure:  [24 cmH20-26 cmH20] 26 cmH20   Intake/Output Summary (Last 24 hours) at 04/01/2023 1057 Last data filed at 04/01/2023 1034 Gross per 24 hour  Intake 1991.4 ml  Output 1845 ml  Net 146.4 ml   Filed Weights   03/29/23 0500 03/31/23 0500 04/01/23 0427  Weight: (!) 181.4 kg (!) 177.7 kg (!) 184 kg   Physical Examination: General: Morbidly obese woman.  HEENT: ETT, OGT in place with no pressure injury. Neuro: On sedation. Will open eyes to voice weakly CV: HS are distant PULM: Diminished breath sounds bilaterally. Pplat is 23 with no signs of gas trapping.  GI: abdomen is soft. FMS in place with brown stools Extremities: Generalized edema, mainly in lower extremities.  Skin: no rashes or lesions  Ancillary test personally reviewed:   Worsening hypercarbia since proning discontinued Mild worsening creatinine now 1.67 Stable mild leukocytosis 12.5 Anemia of acute illness 8.7 Negative procalcitonin, respiratory cultures show oral organisms.  Assessment & Plan:   Acute  on chronic hypoxic and hypercarbic respiratory failure due to severe ARDS caused by COVID-19 pneumonia. Showing signs of improvement. Septic shock secondary to COVID-19 pneumonia Morbid obesity with OSA and obesity related obstructive lung disease. Atrial  fibrillation AKI anasarca following initial fluid resuscitation. Redness right lower leg, recent treatment for cellulitis, likely venous stasis related. Suspect hemorrhoidal bleed  Plan:  -Stop antibiotics given no evidence of bacterial infection. -Continue remdesivir and steroids for COVID-19 pneumonia. -Titrate norepinephrine to keep MAP greater than 65.  Should be able to wean off completely. -Recheck ABG on pCO2 on Vt at 49ml/kg -Continue to wean FiO2 to keep sat > 88%. Start to wean PEEP once FiO2 down to 40%. -Try to lighten sedation. May be able to come off propofol. Will start enteral sedation.  - May be ready to attempt SBT soon.  -Atrial fibrillation is currently rate controlled.   -Continue to follow renal function.  Intermittent diuresis as renal function permits. -Hold heparin and follow CBC.   Best Practice (right click and "Reselect all SmartList Selections" daily)   Diet/type: NPO, advance tube feeds to goal DVT prophylaxis: IV heparin infusion GI prophylaxis: PPI Lines: Central line, Arterial Line, and yes and it is still needed Foley: Foley catheter in place Code Status:  DNR Last date of multidisciplinary goals of care discussion: Last updated by Dr. Delton Coombes 11/24  CRITICAL CARE Performed by: Lynnell Catalan  Total critical care time:40 minutes  Critical care time was exclusive of separately billable procedures and treating other patients.  Critical care was necessary to treat or prevent imminent or life-threatening deterioration.  Critical care was time spent personally by me on the following activities: development of treatment plan with patient and/or surrogate as well as nursing, discussions with consultants, evaluation of patient's response to treatment, examination of patient, obtaining history from patient or surrogate, ordering and performing treatments and interventions, ordering and review of laboratory studies, ordering and review of radiographic studies,  pulse oximetry and re-evaluation of patient's condition.  Lynnell Catalan, MD [facility] ICU Physician [facility] Critical Care  Pager: [phone] Or Epic Secure Chat After hours: [phone].  04/01/2023, 11:34 AM

## 2023-04-02 DIAGNOSIS — G4733 Obstructive sleep apnea (adult) (pediatric): Secondary | ICD-10-CM | POA: Diagnosis not present

## 2023-04-02 DIAGNOSIS — A419 Sepsis, unspecified organism: Secondary | ICD-10-CM | POA: Diagnosis not present

## 2023-04-02 DIAGNOSIS — U071 COVID-19: Secondary | ICD-10-CM | POA: Diagnosis not present

## 2023-04-02 DIAGNOSIS — J80 Acute respiratory distress syndrome: Secondary | ICD-10-CM | POA: Diagnosis not present

## 2023-04-02 LAB — BASIC METABOLIC PANEL
Anion gap: 10 (ref 5–15)
BUN: 99 mg/dL — ABNORMAL HIGH (ref 8–23)
CO2: 31 mmol/L (ref 22–32)
Calcium: 8.4 mg/dL — ABNORMAL LOW (ref 8.9–10.3)
Chloride: 109 mmol/L (ref 98–111)
Creatinine, Ser: 1.13 mg/dL — ABNORMAL HIGH (ref 0.44–1.00)
GFR, Estimated: 51 mL/min — ABNORMAL LOW (ref >60)
Glucose, Bld: 190 mg/dL — ABNORMAL HIGH (ref 70–99)
Potassium: 4 mmol/L (ref 3.5–5.1)
Sodium: 150 mmol/L — ABNORMAL HIGH (ref 135–145)

## 2023-04-02 LAB — POCT I-STAT 7, (LYTES, BLD GAS, ICA,H+H)
Acid-Base Excess: 6 mmol/L — ABNORMAL HIGH (ref 0.0–2.0)
Bicarbonate: 33 mmol/L — ABNORMAL HIGH (ref 20.0–28.0)
Calcium, Ion: 1.14 mmol/L — ABNORMAL LOW (ref 1.15–1.40)
HCT: 25 % — ABNORMAL LOW (ref 36.0–46.0)
Hemoglobin: 8.5 g/dL — ABNORMAL LOW (ref 12.0–15.0)
O2 Saturation: 95 %
Patient temperature: 98.8
Potassium: 3.7 mmol/L (ref 3.5–5.1)
Sodium: 149 mmol/L — ABNORMAL HIGH (ref 135–145)
TCO2: 35 mmol/L — ABNORMAL HIGH (ref 22–32)
pCO2 arterial: 61.3 mm[Hg] — ABNORMAL HIGH (ref 32–48)
pH, Arterial: 7.34 — ABNORMAL LOW (ref 7.35–7.45)
pO2, Arterial: 84 mm[Hg] (ref 83–108)

## 2023-04-02 LAB — MAGNESIUM: Magnesium: 2.4 mg/dL (ref 1.7–2.4)

## 2023-04-02 LAB — GLUCOSE, CAPILLARY
Glucose-Capillary: 111 mg/dL — ABNORMAL HIGH (ref 70–99)
Glucose-Capillary: 165 mg/dL — ABNORMAL HIGH (ref 70–99)
Glucose-Capillary: 172 mg/dL — ABNORMAL HIGH (ref 70–99)
Glucose-Capillary: 176 mg/dL — ABNORMAL HIGH (ref 70–99)
Glucose-Capillary: 179 mg/dL — ABNORMAL HIGH (ref 70–99)
Glucose-Capillary: 191 mg/dL — ABNORMAL HIGH (ref 70–99)
Glucose-Capillary: 200 mg/dL — ABNORMAL HIGH (ref 70–99)

## 2023-04-02 LAB — PHOSPHORUS: Phosphorus: 3.2 mg/dL (ref 2.5–4.6)

## 2023-04-02 LAB — CBC
HCT: 25.3 % — ABNORMAL LOW (ref 36.0–46.0)
Hemoglobin: 7.4 g/dL — ABNORMAL LOW (ref 12.0–15.0)
MCH: 26.6 pg (ref 26.0–34.0)
MCHC: 29.2 g/dL — ABNORMAL LOW (ref 30.0–36.0)
MCV: 91 fL (ref 80.0–100.0)
Platelets: 175 10*3/uL (ref 150–400)
RBC: 2.78 MIL/uL — ABNORMAL LOW (ref 3.87–5.11)
RDW: 15.4 % (ref 11.5–15.5)
WBC: 9.2 10*3/uL (ref 4.0–10.5)
nRBC: 0.3 % — ABNORMAL HIGH (ref 0.0–0.2)

## 2023-04-02 LAB — HEPARIN LEVEL (UNFRACTIONATED): Heparin Unfractionated: 0.11 [IU]/mL — ABNORMAL LOW (ref 0.30–0.70)

## 2023-04-02 LAB — APTT: aPTT: 22 s — ABNORMAL LOW (ref 24–36)

## 2023-04-02 MED ORDER — FUROSEMIDE 10 MG/ML IJ SOLN
20.0000 mg | Freq: Two times a day (BID) | INTRAMUSCULAR | Status: DC
  Administered 2023-04-02 – 2023-04-03 (×2): 20 mg via INTRAVENOUS
  Filled 2023-04-02 (×2): qty 2

## 2023-04-02 MED ORDER — BLISTEX MEDICATED EX OINT
TOPICAL_OINTMENT | CUTANEOUS | Status: DC | PRN
  Filled 2023-04-02: qty 7

## 2023-04-02 MED ORDER — POLYETHYLENE GLYCOL 3350 17 G PO PACK: 17.0000 g | PACK | Freq: Every day | ORAL | Status: DC | PRN

## 2023-04-02 MED ORDER — METOLAZONE 5 MG PO TABS
2.5000 mg | ORAL_TABLET | Freq: Once | ORAL | Status: AC
  Administered 2023-04-02: 2.5 mg
  Filled 2023-04-02: qty 1

## 2023-04-02 MED ORDER — DOCUSATE SODIUM 50 MG/5ML PO LIQD: 100.0000 mg | Freq: Two times a day (BID) | ORAL | Status: DC | PRN

## 2023-04-02 MED ORDER — HEPARIN (PORCINE) 25000 UT/250ML-% IV SOLN
1300.0000 [IU]/h | INTRAVENOUS | Status: DC
  Administered 2023-04-02: 1300 [IU]/h via INTRAVENOUS
  Filled 2023-04-02: qty 250

## 2023-04-02 MED ORDER — FREE WATER
200.0000 mL | Status: DC
  Administered 2023-04-02 – 2023-04-04 (×11): 200 mL

## 2023-04-02 NOTE — Progress Notes (Addendum)
Due to increased upper extremity swelling, pt wedding ring removed and placed in sterile container with patient label placed at bedside to be given to the family when they arrive.

## 2023-04-02 NOTE — Progress Notes (Signed)
NAME:  Kaylee [name], MRN:  [id number], DOB:  June 10, 1945, LOS: 6 ADMISSION DATE:  03/27/2023, CONSULTATION DATE:  03/27/2023 REFERRING MD:  Elgergawy - TRH, CHIEF COMPLAINT: SOB  History of Present Illness:  77 year old woman who presented to [facility] ED 11/20 from [facility] with shortness of breath and increased oxygen requirement. PMHx significant for HTN, Afib (on Xarelto), HFpEF (Echo 09/2020 with EF 65-70%, normal LV/RV function, moderately dilated LA/RA), OSA/?OHS on 2L HOT, asthma, obesity. Recent admission to [facility] 10/3-10/9 for sepsis 2/2 cellulitis of RLE complicated by AKI; patient was discharged to [facility] to complete meropenem course (Lasix stopped at d/c).  History is obtained from chart review. Per EDP, "states that for the past 2.5 months has been feeling weak." Reportedly had positive COVID test 2 days prior to arrival with increasing O2 needs at facility prior to presentation. Denied chest pain, swelling. Labs were notable for WBC 7.1, Hgb 9.4, BUN 29, Cr 1.27, LA 1.4, VBG pH 7.3/pCO2 75, BNP 353. RVP panel + COVID and blood cultures pending. CXR demonstrated edema. On exam she was hypotensive, wheezing. Received Solumedrol, Duonebs, given 1L IVF total for ongoing hypotension. She was placed on BiPAP for ongoing work of breathing. She was initially admitted to hospitalist at [facility] who consulted PCCM for transfer and admission.   Prior to transfer, respiratory status continued to deteriorate prompting intubation.  PCCM consulted for ICU admission.  Pertinent Medical History:  Atrial fibrillation on Xarelto, CHF, obesity, asthma, obstructive sleep apnea on 2L [place] chronically  Significant Hospital Events: Including procedures, antibiotic start and stop dates in addition to other pertinent events   11/20 - Admit to PCCM, intubated at [facility] prior to transport to [facility] 11/22 prone for ARDS 11/23 Remains on 90% FiO2, concern for acute GI bleed overnight with red-colored stool  in Flexi-Seal Hemoccult positive 11/24 hemoglobin remained stable this a.m. prone ventilation discontinued. 11/24 family discussion with Dr. Delton Coombes: CODE STATUS changed to DNR.  Interim History / Subjective:   No further rectal bleeding noted. More awake today, tolerating sedation weaning. Off norepinephrine.  Objective   Blood pressure (!) 115/52, pulse 95, temperature 98.8 F (37.1 C), temperature source Axillary, resp. rate (!) 26, height 5\' 4"  (1.626 m), weight (!) 184 kg, SpO2 97%.    Vent Mode: PRVC FiO2 (%):  [60 %-65 %] 60 % Set Rate:  [26 bmp] 26 bmp Vt Set:  [430 mL] 430 mL PEEP:  [10 cmH20-12 cmH20] 10 cmH20 Plateau Pressure:  [24 cmH20-26 cmH20] 24 cmH20   Intake/Output Summary (Last 24 hours) at 04/02/2023 1000 Last data filed at 04/02/2023 0800 Gross per 24 hour  Intake 1306.69 ml  Output 2995 ml  Net -1688.31 ml   Filed Weights   03/31/23 0500 04/01/23 0427 04/02/23 0718  Weight: (!) 177.7 kg (!) 184 kg (!) 184 kg   Physical Examination: General: Morbidly obese woman.  HEENT: ETT, OGT in place with no pressure injury. Neuro: On less sedation. Will open eyes to voice weakly, able to wiggle her toes to command. CV: HS are distant PULM: Diminished breath sounds bilaterally. Occasional periods of dyssynchrony without desaturation. FiO2 and PEEP down.  GI: abdomen is soft. FMS in place with brown stools Extremities: Generalized edema, mainly in lower extremities.  Skin: no rashes or lesions  Ancillary test personally reviewed:   Stable compensated hypercarbia  Mild worsening creatinine now 1.67 Stable mild leukocytosis 12.5 Anemia of acute illness 8.5 Negative procalcitonin, respiratory cultures show oral organisms.  Assessment &  Plan:   Acute on chronic hypoxic and hypercarbic respiratory failure due to severe ARDS caused by COVID-19 pneumonia. Showing signs of improvement. Septic shock secondary to COVID-19 pneumonia Morbid obesity with OSA and obesity  related obstructive lung disease. Atrial fibrillation AKI anasarca following initial fluid resuscitation. Redness right lower leg, recent treatment for cellulitis, likely venous stasis related. Suspect hemorrhoidal bleed  Plan:  -Stop antibiotics given no evidence of bacterial infection. -Continue remdesivir and steroids for COVID-19 pneumonia. -Continue to wean FiO2 to keep sat > 88%. wean PEEP to 8 once FiO2 down to 40%. -Continue enteral sedation and fentanyl for now. Propofol stopped.  -May be ready to attempt SBT soon once PEEP <8/FiO2 0.4 -Atrial fibrillation is currently rate controlled.   -Continue to follow renal function.  Intermittent diuresis as renal function permits. Good response to 60 mg yesterday.  -Restart low dose heparin infusion.   Best Practice (right click and "Reselect all SmartList Selections" daily)   Diet/type: continue tube feeds at goal.  DVT prophylaxis: IV heparin infusion GI prophylaxis: PPI Lines: Central line, Arterial Line, and yes and it is still needed Foley: Foley catheter in place Code Status:  DNR Last date of multidisciplinary goals of care discussion: Last updated by Dr. Delton Coombes 11/24  CRITICAL CARE Performed by: Lynnell Catalan  Total critical care time:40 minutes  Critical care time was exclusive of separately billable procedures and treating other patients.  Critical care was necessary to treat or prevent imminent or life-threatening deterioration.  Critical care was time spent personally by me on the following activities: development of treatment plan with patient and/or surrogate as well as nursing, discussions with consultants, evaluation of patient's response to treatment, examination of patient, obtaining history from patient or surrogate, ordering and performing treatments and interventions, ordering and review of laboratory studies, ordering and review of radiographic studies, pulse oximetry and re-evaluation of patient's  condition.  Lynnell Catalan, MD [facility] ICU Physician [facility] Critical Care  Pager: [phone] Or Epic Secure Chat After hours: [phone].  04/02/2023, 10:00 AM

## 2023-04-02 NOTE — Plan of Care (Signed)
Fio2 decreased to .50 today and patient is tolerating.

## 2023-04-02 NOTE — Progress Notes (Addendum)
Addendum 2:28 PM,04/02/2023 Patient with blood in flexiseal after starting Heparin.  Discussed with Dr. Denese Killings, CCM MD.   Plan: Discontinue Heparin.  Monitor bleeding.    PHARMACY - ANTICOAGULATION CONSULT NOTE  Pharmacy Consult for heparin Indication: atrial fibrillation  Allergies  Allergen Reactions   Tetanus Toxoid     REACTION: swelling    Patient Measurements: Height: 5\' 4"  (162.6 cm) Weight: (!) 184 kg (405 lb 10.3 oz) IBW/kg (Calculated) : 54.7 Heparin Dosing Weight: 98.4 kg  Vital Signs: Temp: 98.8 F (37.1 C) (11/26 0800) Temp Source: Axillary (11/26 0800) Pulse Rate: 95 (11/26 0545)  Labs: Recent Labs    03/31/23 0311 03/31/23 1631 04/01/23 0420 04/01/23 1149 04/02/23 0453 04/02/23 0906  HGB 8.4*   < > 8.7* 9.2* 7.4* 8.5*  HCT 28.3*   < > 29.8* 27.0* 25.3* 25.0*  PLT 223  --  219  --  175  --   APTT 27  --  24  --   --   --   HEPARINUNFRC 0.35  --  0.21*  --   --   --   CREATININE 1.48*  --  1.67*  --   --   --    < > = values in this interval not displayed.    Estimated Creatinine Clearance: 48.9 mL/min (A) (by C-G formula based on SCr of 1.67 mg/dL (H)).  Assessment: 67 yoF presented with respiratory failure and Covid-19 requiring mechanical ventilation. Pharmacy consulted to dose heparin for atrial fibrillation.  Per nursing home facility [facility], patient was not taking Xarelto, but baseline heparin level was >1.1, indicating that Xarelto is in patient's system.   Heparin was held 11/25 due to concern for possible bleeding. No bleeding in stool. Patient with noted hemorrhoids and fecal management system in place. Discussed with Dr. Denese Killings, CCM and given ok to resume IV heparin, no bolus. Hgb 8.5. Platelets within normal limits.    Goals: Heparin level 0.3-0.7 units/ml aPTT 66-102 seconds Monitor platelets by anticoagulation protocol: Yes   Plan:  Baseline aPTT and Heparin level to determine if correlating versus heparin level still impacted  by recent Xarelto. Restart Heparin infusion (no bolus) at rate of 1300 units/hr. Recheck aPTT and Heparin level in 8 hours.  Monitor for additional s/sx bleeding, CBC as needed  Addnedum: Baseline aPTT 22, Heparin level 0.11 - appear to be correlated. Will follow-up next labs and if still correlated, will follow Heparin level only.    Link Snuffer, PharmD, BCPS, BCCCP Please refer to [facility] for [facility] Pharmacy numbers 04/02/2023 9:53 AM

## 2023-04-03 DIAGNOSIS — R6521 Severe sepsis with septic shock: Secondary | ICD-10-CM

## 2023-04-03 DIAGNOSIS — U071 COVID-19: Secondary | ICD-10-CM | POA: Diagnosis not present

## 2023-04-03 DIAGNOSIS — A419 Sepsis, unspecified organism: Secondary | ICD-10-CM | POA: Diagnosis not present

## 2023-04-03 DIAGNOSIS — J80 Acute respiratory distress syndrome: Secondary | ICD-10-CM | POA: Diagnosis not present

## 2023-04-03 LAB — TRIGLYCERIDES: Triglycerides: 84 mg/dL (ref <150)

## 2023-04-03 LAB — BASIC METABOLIC PANEL
Anion gap: 10 (ref 5–15)
Anion gap: 11 (ref 5–15)
BUN: 94 mg/dL — ABNORMAL HIGH (ref 8–23)
BUN: 92 mg/dL — ABNORMAL HIGH (ref 8–23)
CO2: 30 mmol/L (ref 22–32)
CO2: 34 mmol/L — ABNORMAL HIGH (ref 22–32)
Calcium: 8.3 mg/dL — ABNORMAL LOW (ref 8.9–10.3)
Calcium: 8.2 mg/dL — ABNORMAL LOW (ref 8.9–10.3)
Chloride: 108 mmol/L (ref 98–111)
Chloride: 105 mmol/L (ref 98–111)
Creatinine, Ser: 0.95 mg/dL (ref 0.44–1.00)
Creatinine, Ser: 1.03 mg/dL — ABNORMAL HIGH (ref 0.44–1.00)
GFR, Estimated: >60 mL/min (ref >60)
GFR, Estimated: 57 mL/min — ABNORMAL LOW (ref >60)
Glucose, Bld: 178 mg/dL — ABNORMAL HIGH (ref 70–99)
Glucose, Bld: 220 mg/dL — ABNORMAL HIGH (ref 70–99)
Potassium: 3.8 mmol/L (ref 3.5–5.1)
Potassium: 3.7 mmol/L (ref 3.5–5.1)
Sodium: 148 mmol/L — ABNORMAL HIGH (ref 135–145)
Sodium: 150 mmol/L — ABNORMAL HIGH (ref 135–145)

## 2023-04-03 LAB — CBC
HCT: 25.9 % — ABNORMAL LOW (ref 36.0–46.0)
Hemoglobin: 7.8 g/dL — ABNORMAL LOW (ref 12.0–15.0)
MCH: 26.8 pg (ref 26.0–34.0)
MCHC: 30.1 g/dL (ref 30.0–36.0)
MCV: 89 fL (ref 80.0–100.0)
Platelets: 183 10*3/uL (ref 150–400)
RBC: 2.91 MIL/uL — ABNORMAL LOW (ref 3.87–5.11)
RDW: 15.8 % — ABNORMAL HIGH (ref 11.5–15.5)
WBC: 9.5 10*3/uL (ref 4.0–10.5)
nRBC: 0.3 % — ABNORMAL HIGH (ref 0.0–0.2)

## 2023-04-03 LAB — GLUCOSE, CAPILLARY
Glucose-Capillary: 167 mg/dL — ABNORMAL HIGH (ref 70–99)
Glucose-Capillary: 159 mg/dL — ABNORMAL HIGH (ref 70–99)
Glucose-Capillary: 140 mg/dL — ABNORMAL HIGH (ref 70–99)
Glucose-Capillary: 184 mg/dL — ABNORMAL HIGH (ref 70–99)
Glucose-Capillary: 184 mg/dL — ABNORMAL HIGH (ref 70–99)
Glucose-Capillary: 197 mg/dL — ABNORMAL HIGH (ref 70–99)

## 2023-04-03 MED ORDER — METOPROLOL TARTRATE 25 MG PO TABS
25.0000 mg | ORAL_TABLET | Freq: Two times a day (BID) | ORAL | Status: DC
  Administered 2023-04-03 – 2023-04-04 (×4): 25 mg
  Filled 2023-04-03 (×4): qty 1

## 2023-04-03 MED ORDER — ENOXAPARIN SODIUM 40 MG/0.4ML IJ SOSY
40.0000 mg | PREFILLED_SYRINGE | INTRAMUSCULAR | Status: DC
  Administered 2023-04-03 – 2023-04-05 (×3): 40 mg via SUBCUTANEOUS
  Filled 2023-04-03 (×3): qty 0.4

## 2023-04-03 MED ORDER — DEXMEDETOMIDINE HCL IN NACL 400 MCG/100ML IV SOLN
0.0000 ug/kg/h | INTRAVENOUS | Status: DC
  Administered 2023-04-03: 0.4 ug/kg/h via INTRAVENOUS
  Administered 2023-04-04: 1 ug/kg/h via INTRAVENOUS
  Administered 2023-04-04: 0.8 ug/kg/h via INTRAVENOUS
  Administered 2023-04-04: 0.7 ug/kg/h via INTRAVENOUS
  Administered 2023-04-04: 0.9 ug/kg/h via INTRAVENOUS
  Administered 2023-04-04: 0.6 ug/kg/h via INTRAVENOUS
  Administered 2023-04-05: 1 ug/kg/h via INTRAVENOUS
  Administered 2023-04-05: 0.8 ug/kg/h via INTRAVENOUS
  Administered 2023-04-05: 0.5 ug/kg/h via INTRAVENOUS
  Filled 2023-04-03 (×3): qty 100
  Filled 2023-04-03 (×2): qty 200
  Filled 2023-04-03 (×2): qty 100
  Filled 2023-04-03: qty 200

## 2023-04-03 MED ORDER — NOREPINEPHRINE 4 MG/250ML-% IV SOLN
INTRAVENOUS | Status: AC
  Administered 2023-04-04: 2 ug/min via INTRAVENOUS
  Filled 2023-04-03: qty 250

## 2023-04-03 MED ORDER — FUROSEMIDE 10 MG/ML IJ SOLN
8.0000 mg/h | INTRAVENOUS | Status: DC
  Administered 2023-04-03 – 2023-04-05 (×3): 8 mg/h via INTRAVENOUS
  Filled 2023-04-03 (×3): qty 20

## 2023-04-03 MED ORDER — BANATROL TF EN LIQD
60.0000 mL | Freq: Two times a day (BID) | ENTERAL | Status: DC
  Administered 2023-04-03 – 2023-04-04 (×4): 60 mL
  Filled 2023-04-03 (×4): qty 60

## 2023-04-03 MED ORDER — POTASSIUM CHLORIDE 20 MEQ PO PACK
40.0000 meq | PACK | Freq: Once | ORAL | Status: AC
  Administered 2023-04-03: 40 meq
  Filled 2023-04-03: qty 2

## 2023-04-03 NOTE — Progress Notes (Signed)
   04/03/23 0804  Daily Weaning Assessment  Daily Assessment of Readiness to Wean Wean protocol criteria met (SBT performed)  SBT Method CPAP 5 cm H20 and PS 5 cm H20 (PA 8)  Weaning Start Time 0804  Patient response Failed SBT terminated  Reason SBT Terminated Apnea   RT placed Pt on wean PS 8. Pt generated no effort, placed back on full support for now.

## 2023-04-03 NOTE — Progress Notes (Addendum)
NAME:  Kaylee [name], MRN:  [id number], DOB:  [birth year], LOS: 7 ADMISSION DATE:  03/27/2023, CONSULTATION DATE:  03/27/2023 REFERRING MD:  Elgergawy - TRH, CHIEF COMPLAINT: SOB  History of Present Illness:  77 year old woman who presented to [facility] ED 11/20 from [facility] with shortness of breath and increased oxygen requirement. PMHx significant for HTN, Afib (on Xarelto), HFpEF (Echo 09/2020 with EF 65-70%, normal LV/RV function, moderately dilated LA/RA), OSA/?OHS on 2L HOT, asthma, obesity. Recent admission to [facility] 10/3-10/9 for sepsis 2/2 cellulitis of RLE complicated by AKI; patient was discharged to [facility] to complete meropenem course (Lasix stopped at d/c).  History is obtained from chart review. Per EDP, "states that for the past 2.5 months has been feeling weak." Reportedly had positive COVID test 2 days prior to arrival with increasing O2 needs at facility prior to presentation. Denied chest pain, swelling. Labs were notable for WBC 7.1, Hgb 9.4, BUN 29, Cr 1.27, LA 1.4, VBG pH 7.3/pCO2 75, BNP 353. RVP panel + COVID and blood cultures pending. CXR demonstrated edema. On exam she was hypotensive, wheezing. Received Solumedrol, Duonebs, given 1L IVF total for ongoing hypotension. She was placed on BiPAP for ongoing work of breathing. She was initially admitted to hospitalist at [facility] who consulted PCCM for transfer and admission.   Prior to transfer, respiratory status continued to deteriorate prompting intubation.  PCCM consulted for ICU admission.  Pertinent Medical History:  Atrial fibrillation on Xarelto, CHF, obesity, asthma, obstructive sleep apnea on 2L [place] chronically  Significant Hospital Events: Including procedures, antibiotic start and stop dates in addition to other pertinent events   11/20 - Admit to PCCM, intubated at [facility] prior to transport to [facility] 11/22 prone for ARDS 11/23 Remains on 90% FiO2, concern for acute GI bleed overnight with red-colored stool  in Flexi-Seal Hemoccult positive 11/24 hemoglobin remained stable this a.m. prone ventilation discontinued. 11/24 family discussion with Dr. Delton Coombes: CODE STATUS changed to DNR.  Interim History / Subjective:   Off propofol. Did not tolerate SBT today.   Objective   Blood pressure (!) 134/59, pulse (!) 121, temperature (!) 97.3 F (36.3 C), temperature source Oral, resp. rate 14, height 5\' 4"  (1.626 m), weight (!) 181.9 kg, SpO2 100%.    Vent Mode: PRVC FiO2 (%):  [40 %-50 %] 40 % Set Rate:  [26 bmp] 26 bmp Vt Set:  [430 mL] 430 mL PEEP:  [8 cmH20-10 cmH20] 8 cmH20 Plateau Pressure:  [23 cmH20-26 cmH20] 26 cmH20   Intake/Output Summary (Last 24 hours) at 04/03/2023 0936 Last data filed at 04/03/2023 0800 Gross per 24 hour  Intake 1729.59 ml  Output 5450 ml  Net -3720.41 ml   Filed Weights   04/01/23 0427 04/02/23 0718 04/03/23 0311  Weight: (!) 184 kg (!) 184 kg (!) 181.9 kg   Physical Examination: General: Morbidly obese woman.  HEENT: ETT, OGT in place with no pressure injury. Neuro: On less sedation. Will open eyes to voice and able to wiggle her toes to command. Sticks tongue out.  CV: HS are distant PULM: Diminished breath sounds bilaterally.  FiO2 and PEEP down. Takes good tidal breaths on PSV but with increased accessory muscle use. GI: abdomen is soft. FMS in place with brown stools Extremities: Generalized edema, mainly in lower extremities.  Bilateral venous stasis changes with erythema right worse than left. Skin: no rashes or lesions  Ancillary test personally reviewed:   Sodium improving to 148 Creatinine has normalized to 0.95 Negative procalcitonin,  respiratory cultures show oral organisms.  Assessment & Plan:   Acute on chronic hypoxic and hypercarbic respiratory failure due to severe ARDS caused by COVID-19 pneumonia. Showing signs of improvement. Septic shock secondary to COVID-19 pneumonia Morbid obesity with OSA and obesity related obstructive lung  disease. Atrial fibrillation AKI anasarca following initial fluid resuscitation. Redness right lower leg, recent treatment for cellulitis, likely venous stasis related. Suspect hemorrhoidal bleed  Plan:  -Continue remdesivir and steroids for COVID-19 pneumonia. -Initiate daily SBT's but suspect will need a few days of diuresis prior to successful extubation -Start furosemide infusion and attempt to remove 3 or 4 L/day.  Will be at risk for electrolyte depletion, followed the med twice daily while on furosemide infusion. -Continue enteral sedation at current doses and fentanyl infusion for now.  Titrate to keep RASS -1, -2 -Atrial fibrillation accelerates with agitation. Will start low dose beta-blocker.  - Keeps getting rectal bleeding when heparin restarted. Hold on systemic anticoagulation for now but will start enoxaparin   Best Practice (right click and "Reselect all SmartList Selections" daily)   Diet/type: continue tube feeds at goal.  DVT prophylaxis: IV heparin infusion GI prophylaxis: PPI Lines: Central line, Arterial Line, and yes and it is still needed Foley: Foley catheter in place Code Status:  DNR Last date of multidisciplinary goals of care discussion: Last updated by Dr. Delton Coombes 11/24  CRITICAL CARE Performed by: Lynnell Catalan  Total critical care time:40 minutes  Critical care time was exclusive of separately billable procedures and treating other patients.  Critical care was necessary to treat or prevent imminent or life-threatening deterioration.  Critical care was time spent personally by me on the following activities: development of treatment plan with patient and/or surrogate as well as nursing, discussions with consultants, evaluation of patient's response to treatment, examination of patient, obtaining history from patient or surrogate, ordering and performing treatments and interventions, ordering and review of laboratory studies, ordering and review of  radiographic studies, pulse oximetry and re-evaluation of patient's condition.  Lynnell Catalan, MD [facility] ICU Physician [facility] [place] [facility]  Pager: [phone] Or Epic Secure Chat After hours: [phone].  04/03/2023, 9:36 AM

## 2023-04-03 NOTE — Plan of Care (Signed)
Problem: Clinical Measurements: Goal: Ability to maintain clinical measurements within normal limits will improve Outcome: Progressing   Problem: Nutrition: Goal: Adequate nutrition will be maintained Outcome: Progressing   Problem: Coping: Goal: Level of anxiety will decrease Outcome: Progressing   Problem: Pain Management: Goal: General experience of comfort will improve Outcome: Progressing

## 2023-04-04 DIAGNOSIS — J9622 Acute and chronic respiratory failure with hypercapnia: Secondary | ICD-10-CM | POA: Diagnosis not present

## 2023-04-04 DIAGNOSIS — A419 Sepsis, unspecified organism: Secondary | ICD-10-CM | POA: Diagnosis not present

## 2023-04-04 DIAGNOSIS — U071 COVID-19: Secondary | ICD-10-CM | POA: Diagnosis not present

## 2023-04-04 DIAGNOSIS — J9621 Acute and chronic respiratory failure with hypoxia: Secondary | ICD-10-CM | POA: Diagnosis not present

## 2023-04-04 LAB — BASIC METABOLIC PANEL
Anion gap: 12 (ref 5–15)
Anion gap: 13 (ref 5–15)
BUN: 93 mg/dL — ABNORMAL HIGH (ref 8–23)
BUN: 103 mg/dL — ABNORMAL HIGH (ref 8–23)
CO2: 32 mmol/L (ref 22–32)
CO2: 32 mmol/L (ref 22–32)
Calcium: 8.7 mg/dL — ABNORMAL LOW (ref 8.9–10.3)
Calcium: 8.8 mg/dL — ABNORMAL LOW (ref 8.9–10.3)
Chloride: 101 mmol/L (ref 98–111)
Chloride: 103 mmol/L (ref 98–111)
Creatinine, Ser: 0.95 mg/dL (ref 0.44–1.00)
Creatinine, Ser: 0.91 mg/dL (ref 0.44–1.00)
GFR, Estimated: >60 mL/min (ref >60)
GFR, Estimated: >60 mL/min (ref >60)
Glucose, Bld: 187 mg/dL — ABNORMAL HIGH (ref 70–99)
Glucose, Bld: 214 mg/dL — ABNORMAL HIGH (ref 70–99)
Potassium: 3.4 mmol/L — ABNORMAL LOW (ref 3.5–5.1)
Potassium: 3.7 mmol/L (ref 3.5–5.1)
Sodium: 145 mmol/L (ref 135–145)
Sodium: 148 mmol/L — ABNORMAL HIGH (ref 135–145)

## 2023-04-04 LAB — CBC
HCT: 26.9 % — ABNORMAL LOW (ref 36.0–46.0)
Hemoglobin: 8.5 g/dL — ABNORMAL LOW (ref 12.0–15.0)
MCH: 27.5 pg (ref 26.0–34.0)
MCHC: 31.6 g/dL (ref 30.0–36.0)
MCV: 87.1 fL (ref 80.0–100.0)
Platelets: 169 10*3/uL (ref 150–400)
RBC: 3.09 MIL/uL — ABNORMAL LOW (ref 3.87–5.11)
RDW: 15.5 % (ref 11.5–15.5)
WBC: 6.7 10*3/uL (ref 4.0–10.5)
nRBC: 0.4 % — ABNORMAL HIGH (ref 0.0–0.2)

## 2023-04-04 LAB — GLUCOSE, CAPILLARY
Glucose-Capillary: 183 mg/dL — ABNORMAL HIGH (ref 70–99)
Glucose-Capillary: 225 mg/dL — ABNORMAL HIGH (ref 70–99)
Glucose-Capillary: 226 mg/dL — ABNORMAL HIGH (ref 70–99)

## 2023-04-04 MED ORDER — CLONAZEPAM 1 MG PO TABS
1.0000 mg | ORAL_TABLET | Freq: Two times a day (BID) | ORAL | Status: DC
  Administered 2023-04-04 (×2): 1 mg
  Filled 2023-04-04 (×2): qty 1

## 2023-04-04 MED ORDER — POTASSIUM CHLORIDE 20 MEQ PO PACK
40.0000 meq | PACK | Freq: Once | ORAL | Status: AC
  Administered 2023-04-04: 40 meq
  Filled 2023-04-04: qty 2

## 2023-04-04 MED ORDER — NOREPINEPHRINE 4 MG/250ML-% IV SOLN: 0.0000 ug/min | INTRAVENOUS | Status: DC

## 2023-04-04 MED ORDER — FREE WATER
200.0000 mL | Freq: Four times a day (QID) | Status: DC
  Administered 2023-04-04 – 2023-04-05 (×4): 200 mL

## 2023-04-04 MED ORDER — MIDAZOLAM HCL 2 MG/2ML IJ SOLN
INTRAMUSCULAR | Status: AC
  Filled 2023-04-04: qty 2

## 2023-04-04 MED ORDER — MIDAZOLAM HCL 2 MG/2ML IJ SOLN
1.0000 mg | INTRAMUSCULAR | Status: AC | PRN
Start: 2023-04-04 — End: 2023-04-04
  Administered 2023-04-04 (×2): 2 mg via INTRAVENOUS
  Filled 2023-04-04: qty 2

## 2023-04-04 MED ORDER — NOREPINEPHRINE 4 MG/250ML-% IV SOLN
0.0000 ug/min | INTRAVENOUS | Status: DC
  Administered 2023-04-04: 1 ug/min via INTRAVENOUS

## 2023-04-04 NOTE — Plan of Care (Signed)
  Problem: Education: Goal: Knowledge of General Education information will improve Description: Including pain rating scale, medication(s)/side effects and non-pharmacologic comfort measures Outcome: Not Progressing   Problem: Health Behavior/Discharge Planning: Goal: Ability to manage health-related needs will improve Outcome: Not Progressing   Problem: Clinical Measurements: Goal: Ability to maintain clinical measurements within normal limits will improve Outcome: Not Progressing Goal: Will remain free from infection Outcome: Not Progressing Goal: Diagnostic test results will improve Outcome: Not Progressing Goal: Respiratory complications will improve Outcome: Not Progressing Goal: Cardiovascular complication will be avoided Outcome: Not Progressing   Problem: Activity: Goal: Risk for activity intolerance will decrease Outcome: Not Progressing   Problem: Nutrition: Goal: Adequate nutrition will be maintained Outcome: Not Progressing   Problem: Coping: Goal: Level of anxiety will decrease Outcome: Not Progressing   Problem: Elimination: Goal: Will not experience complications related to bowel motility Outcome: Not Progressing Goal: Will not experience complications related to urinary retention Outcome: Not Progressing   Problem: Pain Management: Goal: General experience of comfort will improve Outcome: Not Progressing   Problem: Safety: Goal: Ability to remain free from injury will improve Outcome: Not Progressing   Problem: Skin Integrity: Goal: Risk for impaired skin integrity will decrease Outcome: Not Progressing   Problem: Education: Goal: Knowledge of risk factors and measures for prevention of condition will improve Outcome: Not Progressing   Problem: Coping: Goal: Psychosocial and spiritual needs will be supported Outcome: Not Progressing   Problem: Respiratory: Goal: Will maintain a patent airway Outcome: Not Progressing Goal: Complications  related to the disease process, condition or treatment will be avoided or minimized Outcome: Not Progressing   Problem: Education: Goal: Ability to describe self-care measures that may prevent or decrease complications (Diabetes Survival Skills Education) will improve Outcome: Not Progressing Goal: Individualized Educational Video(s) Outcome: Not Progressing   Problem: Coping: Goal: Ability to adjust to condition or change in health will improve Outcome: Not Progressing   Problem: Fluid Volume: Goal: Ability to maintain a balanced intake and output will improve Outcome: Not Progressing   Problem: Health Behavior/Discharge Planning: Goal: Ability to identify and utilize available resources and services will improve Outcome: Not Progressing Goal: Ability to manage health-related needs will improve Outcome: Not Progressing   Problem: Metabolic: Goal: Ability to maintain appropriate glucose levels will improve Outcome: Not Progressing   Problem: Nutritional: Goal: Maintenance of adequate nutrition will improve Outcome: Not Progressing Goal: Progress toward achieving an optimal weight will improve Outcome: Not Progressing   Problem: Skin Integrity: Goal: Risk for impaired skin integrity will decrease Outcome: Not Progressing   Problem: Tissue Perfusion: Goal: Adequacy of tissue perfusion will improve Outcome: Not Progressing

## 2023-04-04 NOTE — Progress Notes (Signed)
NAME:  Kaylee [name], MRN:  [id number], DOB:  1945/06/16, LOS: 8 ADMISSION DATE:  03/27/2023, CONSULTATION DATE:  03/27/2023 REFERRING MD:  Elgergawy - TRH, CHIEF COMPLAINT: SOB  History of Present Illness:  77 year old woman who presented to [facility] ED 11/20 from [facility] with shortness of breath and increased oxygen requirement. PMHx significant for HTN, Afib (on Xarelto), HFpEF (Echo 09/2020 with EF 65-70%, normal LV/RV function, moderately dilated LA/RA), OSA/?OHS on 2L HOT, asthma, obesity. Recent admission to [facility] 10/3-10/9 for sepsis 2/2 cellulitis of RLE complicated by AKI; patient was discharged to [facility] to complete meropenem course (Lasix stopped at d/c).  History is obtained from chart review. Per EDP, "states that for the past 2.5 months has been feeling weak." Reportedly had positive COVID test 2 days prior to arrival with increasing O2 needs at facility prior to presentation. Denied chest pain, swelling. Labs were notable for WBC 7.1, Hgb 9.4, BUN 29, Cr 1.27, LA 1.4, VBG pH 7.3/pCO2 75, BNP 353. RVP panel + COVID and blood cultures pending. CXR demonstrated edema. On exam she was hypotensive, wheezing. Received Solumedrol, Duonebs, given 1L IVF total for ongoing hypotension. She was placed on BiPAP for ongoing work of breathing. She was initially admitted to hospitalist at [facility] who consulted PCCM for transfer and admission.   Prior to transfer, respiratory status continued to deteriorate prompting intubation.  PCCM consulted for ICU admission.  Pertinent Medical History:  Atrial fibrillation on Xarelto, CHF, obesity, asthma, obstructive sleep apnea on 2L [place] chronically  Significant Hospital Events: Including procedures, antibiotic start and stop dates in addition to other pertinent events   11/20 - Admit to PCCM, intubated at [facility] prior to transport to [facility]  Interim History / Subjective:  Remain afebrile FiO2 was titrated down to 40% and PEEP of 8 Came off of  vasopressor support Did not tolerate desponded breathing trial due to apnea  Objective   Blood pressure (!) 147/63, pulse 72, temperature 97.9 F (36.6 C), temperature source Axillary, resp. rate (!) 22, height 5\' 4"  (1.626 m), weight (!) 175.6 kg, SpO2 93%.    Vent Mode: PRVC FiO2 (%):  [40 %] 40 % Set Rate:  [25 bmp-26 bmp] 26 bmp Vt Set:  [430 mL] 430 mL PEEP:  [8 cmH20] 8 cmH20 Plateau Pressure:  [16 cmH20-22 cmH20] 17 cmH20   Intake/Output Summary (Last 24 hours) at 04/04/2023 0818 Last data filed at 04/04/2023 0500 Gross per 24 hour  Intake 1471.08 ml  Output 6675 ml  Net -5203.92 ml   Filed Weights   04/02/23 0718 04/03/23 0311 04/04/23 0246  Weight: (!) 184 kg (!) 181.9 kg (!) 175.6 kg   Physical Examination: General: Crtitically ill-appearing morbidly obese female, orally intubated.  Anasarca HEENT: [place]/AT, eyes anicteric.  ETT and OGT in place Neuro: Sedated, not following commands.  Eyes are closed.  Pupils 3 mm bilateral reactive to light Chest: Reduced air entry at the bases bilaterally Heart: Irregularly irregular, no murmurs or gallops Abdomen: Soft, nondistended, bowel sounds present Skin: Stage II decubitus ulcer noted on sacrum, POA  Labs and images reviewed  Resolved Hospital Problem List:     Assessment & Plan:  Acute-on-chronic respiratory failure with hypercarbia/hypoxia COVID-19 pneumonia with superimposed bacterial pneumonia, completed treatment Severe ARDS, improved Septic shock due to COVID-19 pneumonia OSA/OHS Acute respiratory acidosis Continue lung protective ventilation VAP prevention bundle in place ARDS has improved, FiO2 was titrated down to 40% and PEEP of 8 Continue dexamethasone Completed antibiotic therapy for  7 days Came off of vasopressor support Respiratory acidosis have improved Continue aggressive diuresis prior to giving trial of extubation She failed SBT due to apnea Stop fentanyl Continue low-dose Precedex with RASS  goal -1  Possible right lower leg cellulitis Patient was recently treated for right lower leg cellulitis but she still has some erythema and warmth on the medial side of the thigh and lower leg She completed antibiotic therapy but is still continued to have redness around right leg I do not think it is infectious anymore as she has received broad-spectrum antibiotics  Staph epi bacteremia was ruled out One of the 2 blood culture bottles came back positive for gram-positive cocci, ended up with staph epi, likely due to contamination  Atrial Fibrillation, permanent;on diltiazem, coreg, Xarelto CHA2DS2-VASc Score 5 Remaining A-fib with controlled rate Off anticoagulation due to acute lower GI bleeding Echocardiogram showed normal EF with no wall motion abnormalities  AKI CKD stage IIIa Hypernatremia, hypokalemia Serum creatinine continue to improve Monitor intake and output Avoid nephrotoxic agent Serum sodium improved to 145 Decrease free water flushes to 200 mL every 6 hours Continue aggressive electrolyte replacement  Morbid obesity Diet and exercise counseling as appropriate  Acute blood loss anemia on anemia of critical illness Acute lower GI bleeding Monitor H&H and transfuse if less than 7 H&H remained stable, she continued to have slight bleeding and rectal tube, could be due to hemorrhoid versus irritation caused by rectal tube  Best Practice (right click and "Reselect all SmartList Selections" daily)   Diet/type: Tube feeds DVT prophylaxis: Subcu Lovenox GI prophylaxis: PPI Lines: N/A Foley:  N/A Code Status: DNR Last date of multidisciplinary goals of care discussion: 11/26:   Labs   CBC: Recent Labs  Lab 03/31/23 0311 03/31/23 1631 04/01/23 0420 04/01/23 1149 04/02/23 0453 04/02/23 0906 04/03/23 0348 04/04/23 0351  WBC 12.6*  --  12.5*  --  9.2  --  9.5 6.7  HGB 8.4*   < > 8.7* 9.2* 7.4* 8.5* 7.8* 8.5*  HCT 28.3*   < > 29.8* 27.0* 25.3* 25.0* 25.9*  26.9*  MCV 91.6  --  93.7  --  91.0  --  89.0 87.1  PLT 223  --  219  --  175  --  183 169   < > = values in this interval not displayed.   Basic Metabolic Panel: Recent Labs  Lab 04/01/23 0420 04/01/23 1149 04/02/23 0453 04/02/23 0906 04/02/23 1220 04/03/23 0348 04/03/23 1632 04/04/23 0351  NA 143   < >  --  149* 150* 148* 150* 145  K 4.2   < >  --  3.7 4.0 3.8 3.7 3.4*  CL 102  --   --   --  109 108 105 101  CO2 30  --   --   --  31 30 34* 32  GLUCOSE 153*  --   --   --  190* 178* 220* 187*  BUN 86*  --   --   --  99* 94* 92* 93*  CREATININE 1.67*  --   --   --  1.13* 0.95 1.03* 0.95  CALCIUM 7.9*  --   --   --  8.4* 8.3* 8.2* 8.7*  MG  --   --  2.4  --   --   --   --   --   PHOS  --   --  3.2  --   --   --   --   --    < > =  values in this interval not displayed.   GFR: Estimated Creatinine Clearance: 83.3 mL/min (by C-G formula based on SCr of 0.95 mg/dL). Recent Labs  Lab 04/01/23 0420 04/02/23 0453 04/03/23 0348 04/04/23 0351  WBC 12.5* 9.2 9.5 6.7   Liver Function Tests: No results for input(s): "AST", "ALT", "ALKPHOS", "BILITOT", "PROT", "ALBUMIN" in the last 168 hours.  No results for input(s): "LIPASE", "AMYLASE" in the last 168 hours. No results for input(s): "AMMONIA" in the last 168 hours.  ABG:    Component Value Date/Time   PHART 7.340 (L) 04/02/2023 0906   PCO2ART 61.3 (H) 04/02/2023 0906   PO2ART 84 04/02/2023 0906   HCO3 33.0 (H) 04/02/2023 0906   TCO2 35 (H) 04/02/2023 0906   O2SAT 95 04/02/2023 0906    Coagulation Profile: No results for input(s): "INR", "PROTIME" in the last 168 hours.  Cardiac Enzymes: No results for input(s): "CKTOTAL", "CKMB", "CKMBINDEX", "TROPONINI" in the last 168 hours.  HbA1C: Hgb A1c MFr Bld  Date/Time Value Ref Range Status  03/27/2023 11:52 PM 5.4 4.8 - 5.6 % Final    Comment:    (NOTE) Pre diabetes:          5.7%-6.4%  Diabetes:              >6.4%  Glycemic control for   <7.0% adults with  diabetes     CBG: Recent Labs  Lab 04/03/23 1116 04/03/23 1523 04/03/23 1908 04/03/23 2319 04/04/23 0316  GLUCAP 140* 184* 184* 197* 183*    The patient is critically ill due to acute on chronic hypoxic/hypercapnic respiratory failure.  Critical care was necessary to treat or prevent imminent or life-threatening deterioration.  Critical care was time spent personally by me on the following activities: development of treatment plan with patient and/or surrogate as well as nursing, discussions with consultants, evaluation of patient's response to treatment, examination of patient, obtaining history from patient or surrogate, ordering and performing treatments and interventions, ordering and review of laboratory studies, ordering and review of radiographic studies, pulse oximetry, re-evaluation of patient's condition and participation in multidisciplinary rounds.   During this encounter critical care time was devoted to patient care services described in this note for 36 minutes.     Cheri Fowler, MD [place] [facility] See Amion for pager If no response to pager, please call [phone] until 7pm After 7pm, Please call E-link [phone]

## 2023-04-05 DIAGNOSIS — A419 Sepsis, unspecified organism: Secondary | ICD-10-CM | POA: Diagnosis not present

## 2023-04-05 DIAGNOSIS — J9621 Acute and chronic respiratory failure with hypoxia: Secondary | ICD-10-CM | POA: Diagnosis not present

## 2023-04-05 DIAGNOSIS — J9622 Acute and chronic respiratory failure with hypercapnia: Secondary | ICD-10-CM | POA: Diagnosis not present

## 2023-04-05 DIAGNOSIS — U071 COVID-19: Secondary | ICD-10-CM | POA: Diagnosis not present

## 2023-04-05 LAB — BASIC METABOLIC PANEL
Anion gap: 15 (ref 5–15)
BUN: 97 mg/dL — ABNORMAL HIGH (ref 8–23)
CO2: 31 mmol/L (ref 22–32)
Calcium: 9.1 mg/dL (ref 8.9–10.3)
Chloride: 102 mmol/L (ref 98–111)
Creatinine, Ser: 1.11 mg/dL — ABNORMAL HIGH (ref 0.44–1.00)
GFR, Estimated: 52 mL/min — ABNORMAL LOW (ref >60)
Glucose, Bld: 240 mg/dL — ABNORMAL HIGH (ref 70–99)
Potassium: 3.5 mmol/L (ref 3.5–5.1)
Sodium: 148 mmol/L — ABNORMAL HIGH (ref 135–145)

## 2023-04-05 LAB — CBC
HCT: 28.9 % — ABNORMAL LOW (ref 36.0–46.0)
Hemoglobin: 9.1 g/dL — ABNORMAL LOW (ref 12.0–15.0)
MCH: 27.2 pg (ref 26.0–34.0)
MCHC: 31.5 g/dL (ref 30.0–36.0)
MCV: 86.5 fL (ref 80.0–100.0)
Platelets: 185 10*3/uL (ref 150–400)
RBC: 3.34 MIL/uL — ABNORMAL LOW (ref 3.87–5.11)
RDW: 15.9 % — ABNORMAL HIGH (ref 11.5–15.5)
WBC: 7.1 10*3/uL (ref 4.0–10.5)
nRBC: 0 % (ref 0.0–0.2)

## 2023-04-05 LAB — GLUCOSE, CAPILLARY
Glucose-Capillary: 202 mg/dL — ABNORMAL HIGH (ref 70–99)
Glucose-Capillary: 213 mg/dL — ABNORMAL HIGH (ref 70–99)
Glucose-Capillary: 194 mg/dL — ABNORMAL HIGH (ref 70–99)
Glucose-Capillary: 211 mg/dL — ABNORMAL HIGH (ref 70–99)
Glucose-Capillary: 206 mg/dL — ABNORMAL HIGH (ref 70–99)
Glucose-Capillary: 163 mg/dL — ABNORMAL HIGH (ref 70–99)

## 2023-04-05 MED ORDER — POLYVINYL ALCOHOL 1.4 % OP SOLN: 1.0000 [drp] | Freq: Four times a day (QID) | OPHTHALMIC | Status: DC | PRN

## 2023-04-05 MED ORDER — INSULIN ASPART 100 UNIT/ML IJ SOLN: 0.0000 [IU] | INTRAMUSCULAR | Status: DC

## 2023-04-05 MED ORDER — GLYCOPYRROLATE 0.2 MG/ML IJ SOLN: 0.2000 mg | INTRAMUSCULAR | Status: DC | PRN

## 2023-04-05 MED ORDER — MORPHINE BOLUS VIA INFUSION
5.0000 mg | INTRAVENOUS | Status: DC | PRN
Start: 2023-04-05 — End: 2023-04-06

## 2023-04-05 MED ORDER — ACETAMINOPHEN 650 MG RE SUPP: 650.0000 mg | Freq: Four times a day (QID) | RECTAL | Status: DC | PRN

## 2023-04-05 MED ORDER — MORPHINE SULFATE (PF) 2 MG/ML IV SOLN: 2.0000 mg | INTRAVENOUS | Status: DC | PRN

## 2023-04-05 MED ORDER — INSULIN ASPART 100 UNIT/ML IJ SOLN
0.0000 [IU] | INTRAMUSCULAR | Status: DC
  Administered 2023-04-05: 2 [IU] via SUBCUTANEOUS

## 2023-04-05 MED ORDER — MORPHINE 100MG IN NS 100ML (1MG/ML) PREMIX INFUSION: 0.0000 mg/h | INTRAVENOUS | Status: DC

## 2023-04-05 MED ORDER — MIDAZOLAM HCL 2 MG/2ML IJ SOLN: 2.0000 mg | INTRAMUSCULAR | Status: DC | PRN

## 2023-04-05 MED ORDER — ACETAMINOPHEN 325 MG PO TABS: 650.0000 mg | ORAL_TABLET | Freq: Four times a day (QID) | ORAL | Status: DC | PRN

## 2023-04-05 MED ORDER — GLYCOPYRROLATE 1 MG PO TABS: 1.0000 mg | ORAL_TABLET | ORAL | Status: DC | PRN

## 2023-04-05 NOTE — Progress Notes (Signed)
Cortrak Tube Team Note:  Consult received to place a Cortrak feeding tube.   RD at bedside to place tube. Pt refusing tube placement; pt reports that she does not want any more tubes as she does not feel she can tolerate it. RN and family at bedside to discuss with pt. RD will hold on tube placement at this time. Please re-consult if cortrak tube is again needed.   Betsey Holiday MS, RD, LDN Please refer to [facility] for RD and/or RD on-call/weekend/after hours pager

## 2023-04-05 NOTE — Procedures (Signed)
Extubation Procedure Note  Patient Details:   Name: Kaylee [name] DOB: March 15, 1946 MRN: [id number]   Airway Documentation:    Vent end date: 04/05/23 Vent end time: 1035 (extubated to bipap.)   Evaluation  O2 sats: stable throughout Complications: No apparent complications Patient did tolerate procedure well. Bilateral Breath Sounds: Diminished   No  Pt extubated to bipap per physician order. Pt suctioned via ETT/orally prior. Upon extubation pt fitted with a Large full face mask without complication. Pt unable to speak name at this time or cough. No stridor heard. RT will continue to monitor and be available as needed.   Derinda Late 04/05/2023, 10:48 AM

## 2023-04-05 NOTE — Progress Notes (Signed)
Pt off bipap at this time on 6L nasal cannula. No distress noted. Pt resting comfortably at this time.

## 2023-04-05 NOTE — IPAL (Signed)
  Interdisciplinary Goals of Care Family Meeting   Date carried out: 04/05/2023  Location of the meeting: Bedside  Member's involved: Physician, Bedside Registered Nurse, and Family Member or next of kin  Durable Power of Attorney or acting medical decision maker: Penni Homans    Discussion: We discussed goals of care for [employer] .   Patient was extubated with intention to not reintubate if she struggles, Post extubation patient was kept on Bipap and after coming off Bipap she stated she is ready to die and would like to be comfortable and doesn't want aggressive treatment. Patient family was at bedside and they are in agrrement to proceed with comfort care as patients life quality has been poor for quite sometime. Comfort care orders written  Code status:   Code Status: Do not attempt resuscitation (DNR) - Comfort care   Disposition: In-patient comfort care   Cheri Fowler, MD  04/05/2023, 2:57 PM

## 2023-04-05 NOTE — Progress Notes (Signed)
NAME:  Kaylee [name], MRN:  [id number], DOB:  [birth year], LOS: 9 ADMISSION DATE:  03/27/2023, CONSULTATION DATE:  03/27/2023 REFERRING MD:  Elgergawy - TRH, CHIEF COMPLAINT: SOB  History of Present Illness:  77 year old woman who presented to [facility] ED 11/20 from [facility] with shortness of breath and increased oxygen requirement. PMHx significant for HTN, Afib (on Xarelto), HFpEF (Echo 09/2020 with EF 65-70%, normal LV/RV function, moderately dilated LA/RA), OSA/?OHS on 2L [place], asthma, obesity. Recent admission to [facility] 10/3-10/9 for sepsis 2/2 cellulitis of RLE complicated by AKI; patient was discharged to [facility] to complete meropenem course (Lasix stopped at d/c).  History is obtained from chart review. Per EDP, "states that for the past 2.5 months has been feeling weak." Reportedly had positive COVID test 2 days prior to arrival with increasing O2 needs at facility prior to presentation. Denied chest pain, swelling. Labs were notable for WBC 7.1, Hgb 9.4, BUN 29, Cr 1.27, LA 1.4, VBG pH 7.3/pCO2 75, BNP 353. RVP panel + COVID and blood cultures pending. CXR demonstrated edema. On exam she was hypotensive, wheezing. Received Solumedrol, Duonebs, given 1L IVF total for ongoing hypotension. She was placed on BiPAP for ongoing work of breathing. She was initially admitted to hospitalist at [facility] who consulted PCCM for transfer and admission.   Prior to transfer, respiratory status continued to deteriorate prompting intubation.  PCCM consulted for ICU admission.  Pertinent Medical History:  Atrial fibrillation on Xarelto, CHF, obesity, asthma, obstructive sleep apnea on 2L [place] chronically  Significant Hospital Events: Including procedures, antibiotic start and stop dates in addition to other pertinent events   11/20 - Admit to PCCM, intubated at [facility] prior to transport to [facility]  Interim History / Subjective:  Remain afebrile Patient is tolerating supportive breathing trial, will  try to extubate her She is off sedation, following commands  Objective   Blood pressure (!) 151/67, pulse 79, temperature 97.9 F (36.6 C), temperature source Axillary, resp. rate (!) 26, height 5\' 4"  (1.626 m), weight (!) 175.9 kg, SpO2 97%.    Vent Mode: PSV;CPAP FiO2 (%):  [40 %] 40 % Set Rate:  [26 bmp] 26 bmp Vt Set:  [430 mL] 430 mL PEEP:  [5 cmH20-8 cmH20] 5 cmH20 Pressure Support:  [5 cmH20-8 cmH20] 8 cmH20 Plateau Pressure:  [17 cmH20-18 cmH20] 18 cmH20   Intake/Output Summary (Last 24 hours) at 04/05/2023 0928 Last data filed at 04/05/2023 0865 Gross per 24 hour  Intake 2204.6 ml  Output 6255 ml  Net -4050.4 ml   Filed Weights   04/03/23 0311 04/04/23 0246 04/05/23 0418  Weight: (!) 181.9 kg (!) 175.6 kg (!) 175.9 kg   Physical Examination: General: Crtitically ill-appearing morbidly obese female, orally intubated HEENT: [place]/AT, eyes anicteric.  ETT and OGT in place Neuro: Eyes open, following simple commands Chest: Reduced air entry all over, no wheezes or rhonchi Heart: Irregularly irregular, no murmurs or gallops Abdomen: Soft, nondistended, bowel sounds present Skin: Stage II decubitus ulcer noted on sacrum, POA  Labs and images reviewed  Resolved Hospital Problem List:     Assessment & Plan:  Acute-on-chronic respiratory failure with hypercarbia/hypoxia COVID-19 pneumonia with superimposed bacterial pneumonia, completed treatment Severe ARDS, improved Septic shock due to COVID-19 pneumonia OSA/OHS Acute respiratory acidosis Continue lung protective ventilation VAP prevention bundle in place ARDS has resolved Continue dexamethasone Patient completed antibiotic therapy for 7 days Off vasopressor support Tolerating spontaneous breathing trial, will try to extubate her, she will be placed on  BiPAP postextubation Off sedation  Possible right lower leg cellulitis Patient was recently treated for right lower leg cellulitis but she still has some  erythema and warmth on the medial side of the thigh and lower leg She completed antibiotic therapy but is still continued to have redness around right leg I do not think it is infectious anymore as she has received broad-spectrum antibiotics  Staph epi bacteremia was ruled out One of the 2 blood culture bottles came back positive for gram-positive cocci, ended up with staph epi, likely due to contamination  Atrial Fibrillation, permanent;on diltiazem, coreg, Xarelto CHA2DS2-VASc Score 5 Remaining A-fib with controlled rate Off anticoagulation due to acute lower GI bleeding, though hemoglobin is stable Echocardiogram showed normal EF with no wall motion abnormalities  AKI CKD stage IIIa Hypernatremia, hypokalemia Serum creatinine rose after getting aggressive Lasix Monitor intake and output Avoid nephrotoxic agent Serum sodium increased to 148 Continue free water flushes to 200 mL every 6 hours Continue aggressive electrolyte replacement  Morbid obesity Diet and exercise counseling as appropriate  Acute blood loss anemia on anemia of critical illness Acute lower GI bleeding Monitor H&H and transfuse if less than 7 H&H remained stable, she continued to have slight bleeding and rectal tube, could be due to hemorrhoid versus irritation caused by rectal tube  Best Practice (right click and "Reselect all SmartList Selections" daily)   Diet/type: Tube feeds DVT prophylaxis: Subcu Lovenox GI prophylaxis: PPI Lines: N/A Foley:  N/A Code Status: DNR Last date of multidisciplinary goals of care discussion: 11/29: Patient will be extubated 1 way over BiPAP, if she struggles with breathing, patient's family decided to proceed with comfort care at that time  Labs   CBC: Recent Labs  Lab 04/01/23 0420 04/01/23 1149 04/02/23 0453 04/02/23 0906 04/03/23 0348 04/04/23 0351 04/05/23 0559  WBC 12.5*  --  9.2  --  9.5 6.7 7.1  HGB 8.7*   < > 7.4* 8.5* 7.8* 8.5* 9.1*  HCT 29.8*   < >  25.3* 25.0* 25.9* 26.9* 28.9*  MCV 93.7  --  91.0  --  89.0 87.1 86.5  PLT 219  --  175  --  183 169 185   < > = values in this interval not displayed.   Basic Metabolic Panel: Recent Labs  Lab 04/02/23 0453 04/02/23 0906 04/03/23 0348 04/03/23 1632 04/04/23 0351 04/04/23 1746 04/05/23 0559  NA  --    < > 148* 150* 145 148* 148*  K  --    < > 3.8 3.7 3.4* 3.7 3.5  CL  --    < > 108 105 101 103 102  CO2  --    < > 30 34* 32 32 31  GLUCOSE  --    < > 178* 220* 187* 214* 240*  BUN  --    < > 94* 92* 93* 103* 97*  CREATININE  --    < > 0.95 1.03* 0.95 0.91 1.11*  CALCIUM  --    < > 8.3* 8.2* 8.7* 8.8* 9.1  MG 2.4  --   --   --   --   --   --   PHOS 3.2  --   --   --   --   --   --    < > = values in this interval not displayed.   GFR: Estimated Creatinine Clearance: 71.3 mL/min (A) (by C-G formula based on SCr of 1.11 mg/dL (H)). Recent Labs  Lab 04/02/23 [phone]  04/03/23 0348 04/04/23 0351 04/05/23 0559  WBC 9.2 9.5 6.7 7.1   Liver Function Tests: No results for input(s): "AST", "ALT", "ALKPHOS", "BILITOT", "PROT", "ALBUMIN" in the last 168 hours.  No results for input(s): "LIPASE", "AMYLASE" in the last 168 hours. No results for input(s): "AMMONIA" in the last 168 hours.  ABG:    Component Value Date/Time   PHART 7.340 (L) 04/02/2023 0906   PCO2ART 61.3 (H) 04/02/2023 0906   PO2ART 84 04/02/2023 0906   HCO3 33.0 (H) 04/02/2023 0906   TCO2 35 (H) 04/02/2023 0906   O2SAT 95 04/02/2023 0906    Coagulation Profile: No results for input(s): "INR", "PROTIME" in the last 168 hours.  Cardiac Enzymes: No results for input(s): "CKTOTAL", "CKMB", "CKMBINDEX", "TROPONINI" in the last 168 hours.  HbA1C: Hgb A1c MFr Bld  Date/Time Value Ref Range Status  03/27/2023 11:52 PM 5.4 4.8 - 5.6 % Final    Comment:    (NOTE) Pre diabetes:          5.7%-6.4%  Diabetes:              >6.4%  Glycemic control for   <7.0% adults with diabetes     CBG: Recent Labs  Lab  04/04/23 0316 04/04/23 1515 04/04/23 2321 04/05/23 0308 04/05/23 0723  GLUCAP 183* 225* 226* 211* 206*    The patient is critically ill due to acute on chronic hypoxic/hypercapnic respiratory failure.  Critical care was necessary to treat or prevent imminent or life-threatening deterioration.  Critical care was time spent personally by me on the following activities: development of treatment plan with patient and/or surrogate as well as nursing, discussions with consultants, evaluation of patient's response to treatment, examination of patient, obtaining history from patient or surrogate, ordering and performing treatments and interventions, ordering and review of laboratory studies, ordering and review of radiographic studies, pulse oximetry, re-evaluation of patient's condition and participation in multidisciplinary rounds.   During this encounter critical care time was devoted to patient care services described in this note for 35 minutes.     Cheri Fowler, MD [facility] See Amion for pager If no response to pager, please call [phone] until 7pm After 7pm, Please call E-link [phone]

## 2023-04-06 DIAGNOSIS — Z515 Encounter for palliative care: Secondary | ICD-10-CM

## 2023-04-06 DIAGNOSIS — Z7189 Other specified counseling: Secondary | ICD-10-CM

## 2023-04-06 DIAGNOSIS — J9622 Acute and chronic respiratory failure with hypercapnia: Secondary | ICD-10-CM | POA: Diagnosis not present

## 2023-04-06 DIAGNOSIS — J9621 Acute and chronic respiratory failure with hypoxia: Secondary | ICD-10-CM | POA: Diagnosis not present

## 2023-04-06 DIAGNOSIS — Z789 Other specified health status: Secondary | ICD-10-CM

## 2023-04-06 DIAGNOSIS — Z66 Do not resuscitate: Secondary | ICD-10-CM

## 2023-04-06 MED ORDER — MORPHINE SULFATE 15 MG PO TABS: 15.0000 mg | ORAL_TABLET | ORAL | Status: DC | PRN

## 2023-04-06 MED ORDER — LORAZEPAM 1 MG PO TABS: 1.0000 mg | ORAL_TABLET | ORAL | Status: DC | PRN

## 2023-04-06 MED ORDER — LORAZEPAM 1 MG PO TABS: 1.0000 mg | ORAL_TABLET | ORAL | 0 refills | Status: AC | PRN

## 2023-04-06 MED ORDER — MORPHINE SULFATE 15 MG PO TABS: 15.0000 mg | ORAL_TABLET | ORAL | 0 refills | Status: AC | PRN

## 2023-04-06 NOTE — Progress Notes (Signed)
PROGRESS NOTE  Kaylee [name]  VQQ:[id number] DOB: [birth year] DOA: 03/27/2023 PCP: Ignatius Specking, MD   Brief Narrative: Patient is a 77 year old female with history of hypertension, A-fib, diastolic CHF, OSA, asthma, morbid obesity who initially presented to [facility] from skilled nursing facility with complaint of shortness of breath, being hypoxic.  She was recently admitted to [facility] for sepsis secondary to cellulitis of right lower extremity.  Patient was also recently tested positive for COVID.  On presentation, she was hypotensive, wheezing, placed on BiPAP and was transferred to ICU service here at [facility].  Patient's respite status continued to decline, needing intubation.  Patient was extubated on 10/21 but she continued to have respiratory distress.  Patient stated that she wants to die and opted for comfort measures.  Currently on full comfort care.  On morphine drip transferred to [facility] service on 11/30.  Patient does not want to die in the hospital.  Palliative care consulted.  TOC consulted for placement in residential hospice.  Assessment & Plan:  Principal Problem:   Respiratory failure (HCC) Active Problems:   Acute on chronic hypoxic respiratory failure (HCC)   ARDS (adult respiratory distress syndrome) (HCC)  Acute on chronic hypoxic respiratory failure: Presented with hypoxia, dyspnea.  Recent COVID history.  Found to have superimposed bacterial  pneumonia.  Had to be intubated for worsening respiratory status.  Hospital course remarkable for hypotension.  Had to be intubated, extubated but continued to be on respiratory distress.  Started on full comfort care by critical care, on morphine drip.  History of right lower leg cellulitis: Treated with antibiotics.  Currently on comfort care  Permanent A-fib: Currently on comfort care.  Previously taking diltiazem, Coreg, Xarelto  AKI on CKD stage IIIa/hypernatremia/hypokalemia: Currently on comfort care  Anemia  secondary to critical illness: Currently on comfort care  Morbid obesity: Appears significantly volume overloaded  Goals of care: Long discussion held with daughter at bedside.  Patient is currently alert and awake.  She repeatedly says she does not want to die in the hospital.  She is on morphine drip.  We request palliative care for assistance for full comfort measures.  TOC consulted for residential hospice placement    Nutrition Problem: Inadequate oral intake Etiology: acute illness, inability to eat    DVT prophylaxis:SCDs Start: 03/27/23 2110     Code Status: Do not attempt resuscitation (DNR) - Comfort care  Family Communication: Discussed with daughter at bedside  Patient status:Inpatient  Patient is from :SNF  Anticipated discharge to: Residential hospice  Estimated DC date: 1 to 2 days   Consultants: PCCM  Procedures: Intubation  Antimicrobials:  Anti-infectives (From admission, onward)    Start     Dose/Rate Route Frequency Ordered Stop   03/30/23 0800  vancomycin (VANCOREADY) IVPB 1500 mg/300 mL  Status:  Discontinued        1,500 mg 150 mL/hr over 120 Minutes Intravenous Every 24 hours 03/29/23 0735 04/01/23 1103   03/29/23 1800  vancomycin (VANCOREADY) IVPB 1500 mg/300 mL  Status:  Discontinued        1,500 mg 150 mL/hr over 120 Minutes Intravenous Every 24 hours 03/28/23 1739 03/29/23 0735   03/29/23 1000  remdesivir 100 mg in sodium chloride 0.9 % 100 mL IVPB  Status:  Discontinued       Placed in "Followed by" Linked Group   100 mg 200 mL/hr over 30 Minutes Intravenous Daily 03/27/23 2227 03/28/23 0903   03/29/23 0830  vancomycin (VANCOCIN) 2,500  mg in sodium chloride 0.9 % 500 mL IVPB        2,500 mg 262.5 mL/hr over 120 Minutes Intravenous STAT 03/29/23 0735 03/29/23 1103   03/28/23 1830  vancomycin (VANCOCIN) 2,500 mg in sodium chloride 0.9 % 500 mL IVPB  Status:  Discontinued        2,500 mg 262.5 mL/hr over 120 Minutes Intravenous  Once  03/28/23 1736 03/29/23 1021   03/28/23 0000  remdesivir 200 mg in sodium chloride 0.9% 250 mL IVPB       Placed in "Followed by" Linked Group   200 mg 580 mL/hr over 30 Minutes Intravenous Once 03/27/23 2227 03/28/23 0101   03/27/23 1800  azithromycin (ZITHROMAX) 500 mg in sodium chloride 0.9 % 250 mL IVPB        500 mg 250 mL/hr over 60 Minutes Intravenous Every 24 hours 03/27/23 1628 03/31/23 2022   03/27/23 1700  cefTRIAXone (ROCEPHIN) 2 g in sodium chloride 0.9 % 100 mL IVPB  Status:  Discontinued        2 g 200 mL/hr over 30 Minutes Intravenous Every 24 hours 03/27/23 1628 04/01/23 1103       Subjective: Patient seen and examined at bedside today.  Lying in bed. Talking.  Not in respiratory distress.  Morbidly obese, and appears volume overloaded.  Objective: Vitals:   04/05/23 1200 04/05/23 1300 04/05/23 1640 04/06/23 0500  BP:   112/76 134/83  Pulse: (!) 106 (!) 109 (!) 118 (!) 107  Resp: 18 11 18 16   Temp:      TempSrc:      SpO2: 97% 99% 99% 100%  Weight:      Height:        Intake/Output Summary (Last 24 hours) at 04/06/2023 0756 Last data filed at 04/05/2023 1700 Gross per 24 hour  Intake 535.08 ml  Output --  Net 535.08 ml   Filed Weights   04/03/23 0311 04/04/23 0246 04/05/23 0418  Weight: (!) 181.9 kg (!) 175.6 kg (!) 175.9 kg    Examination:  General exam: Overall comfortable, not in distress, morbidly obese Respiratory system:   diminished sounds bilaterally Cardiovascular system: S1 & S2 heard, RRR.  Gastrointestinal system: Abdomen is distended, soft and nontender. Central nervous system: Alert and awake, confused Extremities: Severe bilateral lower extremity pitting edema, no clubbing ,no cyanosis Skin: No rashes, no ulcers,no icterus     Data Reviewed: I have personally reviewed following labs and imaging studies  CBC: Recent Labs  Lab 04/01/23 0420 04/01/23 1149 04/02/23 0453 04/02/23 0906 04/03/23 0348 04/04/23 0351 04/05/23 0559   WBC 12.5*  --  9.2  --  9.5 6.7 7.1  HGB 8.7*   < > 7.4* 8.5* 7.8* 8.5* 9.1*  HCT 29.8*   < > 25.3* 25.0* 25.9* 26.9* 28.9*  MCV 93.7  --  91.0  --  89.0 87.1 86.5  PLT 219  --  175  --  183 169 185   < > = values in this interval not displayed.   Basic Metabolic Panel: Recent Labs  Lab 04/02/23 0453 04/02/23 0906 04/03/23 0348 04/03/23 1632 04/04/23 0351 04/04/23 1746 04/05/23 0559  NA  --    < > 148* 150* 145 148* 148*  K  --    < > 3.8 3.7 3.4* 3.7 3.5  CL  --    < > 108 105 101 103 102  CO2  --    < > 30 34* 32 32 31  GLUCOSE  --    < >  178* 220* 187* 214* 240*  BUN  --    < > 94* 92* 93* 103* 97*  CREATININE  --    < > 0.95 1.03* 0.95 0.91 1.11*  CALCIUM  --    < > 8.3* 8.2* 8.7* 8.8* 9.1  MG 2.4  --   --   --   --   --   --   PHOS 3.2  --   --   --   --   --   --    < > = values in this interval not displayed.     Recent Results (from the past 240 hour(s))  Resp panel by RT-PCR (RSV, Flu A&B, Covid) Anterior Nasal Swab     Status: Abnormal   Collection Time: 03/27/23 11:40 AM   Specimen: Anterior Nasal Swab  Result Value Ref Range Status   SARS Coronavirus 2 by RT PCR POSITIVE (A) NEGATIVE Final    Comment: (NOTE) SARS-CoV-2 target nucleic acids are DETECTED.  The SARS-CoV-2 RNA is generally detectable in upper respiratory specimens during the acute phase of infection. Positive results are indicative of the presence of the identified virus, but do not rule out bacterial infection or co-infection with other pathogens not detected by the test. Clinical correlation with patient history and other diagnostic information is necessary to determine patient infection status. The expected result is Negative.  Fact Sheet for Patients: BloggerCourse.com  Fact Sheet for Healthcare Providers: SeriousBroker.it  This test is not yet approved or cleared by the [place] FDA and  has been authorized for detection and/or  diagnosis of SARS-CoV-2 by FDA under an Emergency Use Authorization (EUA).  This EUA will remain in effect (meaning this test can be used) for the duration of  the COVID-19 declaration under Section 564(b)(1) of the A ct, 21 U.S.C. section 360bbb-3(b)(1), unless the authorization is terminated or revoked sooner.     Influenza A by PCR NEGATIVE NEGATIVE Final   Influenza B by PCR NEGATIVE NEGATIVE Final    Comment: (NOTE) The Xpert Xpress SARS-CoV-2/FLU/RSV plus assay is intended as an aid in the diagnosis of influenza from Nasopharyngeal swab specimens and should not be used as a sole basis for treatment. Nasal washings and aspirates are unacceptable for Xpert Xpress SARS-CoV-2/FLU/RSV testing.  Fact Sheet for Patients: BloggerCourse.com  Fact Sheet for Healthcare Providers: SeriousBroker.it  This test is not yet approved or cleared by the [place] FDA and has been authorized for detection and/or diagnosis of SARS-CoV-2 by FDA under an Emergency Use Authorization (EUA). This EUA will remain in effect (meaning this test can be used) for the duration of the COVID-19 declaration under Section 564(b)(1) of the Act, 21 U.S.C. section 360bbb-3(b)(1), unless the authorization is terminated or revoked.     Resp Syncytial Virus by PCR NEGATIVE NEGATIVE Final    Comment: (NOTE) Fact Sheet for Patients: BloggerCourse.com  Fact Sheet for Healthcare Providers: SeriousBroker.it  This test is not yet approved or cleared by the [place] FDA and has been authorized for detection and/or diagnosis of SARS-CoV-2 by FDA under an Emergency Use Authorization (EUA). This EUA will remain in effect (meaning this test can be used) for the duration of the COVID-19 declaration under Section 564(b)(1) of the Act, 21 U.S.C. section 360bbb-3(b)(1), unless the authorization is terminated  or revoked.  Performed at [facility], [address], [place], [place] [address]   Blood Culture (routine x 2)     Status: None   Collection Time: 03/27/23  12:54 PM   Specimen: BLOOD  Result Value Ref Range Status   Specimen Description BLOOD BLOOD RIGHT ARM  Final   Special Requests   Final    Blood Culture results may not be optimal due to an excessive volume of blood received in culture bottles BOTTLES DRAWN AEROBIC AND ANAEROBIC   Culture   Final    NO GROWTH 5 DAYS Performed at [facility], [address], [place], [place] [address]    Report Status 04/01/2023 FINAL  Final  Blood Culture (routine x 2)     Status: Abnormal   Collection Time: 03/27/23  1:06 PM   Specimen: BLOOD  Result Value Ref Range Status   Specimen Description   Final    BLOOD RFOA Performed at [facility], [address], [place], [place] [address]    Special Requests   Final    BOTTLES DRAWN AEROBIC AND ANAEROBIC Blood Culture adequate volume Performed at [facility], [address] [place], [place] [address]    Culture  Setup Time   Final    GRAM POSITIVE COCCI AEROBIC BOTTLE ONLY CRITICAL RESULT CALLED TO, READ BACK BY AND VERIFIED WITHRichardson Dopp [facility] AT 6962 03/28/2023 BY T KENNEDY Performed at [facility], [address] [place], [place] [address]    Culture (A)  Final    STAPHYLOCOCCUS EPIDERMIDIS THE SIGNIFICANCE OF ISOLATING THIS ORGANISM FROM A SINGLE SET OF BLOOD CULTURES WHEN MULTIPLE SETS ARE DRAWN IS UNCERTAIN. PLEASE NOTIFY THE MICROBIOLOGY DEPARTMENT WITHIN ONE WEEK IF SPECIATION AND SENSITIVITIES ARE REQUIRED. Performed at [facility], [address], [place], [place] [address]    Report Status 03/29/2023 FINAL  Final  MRSA Next Gen by PCR, Nasal     Status: None   Collection Time: 03/27/23  8:59 PM   Specimen: Nasal Mucosa; Nasal Swab  Result Value Ref Range Status   MRSA by PCR Next Gen NOT DETECTED NOT DETECTED Final    Comment: (NOTE) The GeneXpert MRSA Assay (FDA  approved for NASAL specimens only), is one component of a comprehensive MRSA colonization surveillance program. It is not intended to diagnose MRSA infection nor to guide or monitor treatment for MRSA infections. Test performance is not FDA approved in patients less than 22 years old. Performed at [facility], [address], [place], [place] [address]   Respiratory (~20 pathogens) panel by PCR     Status: None   Collection Time: 03/28/23 12:32 AM   Specimen: Nasopharyngeal Swab; Respiratory  Result Value Ref Range Status   Adenovirus NOT DETECTED NOT DETECTED Final   Coronavirus 229E NOT DETECTED NOT DETECTED Final    Comment: (NOTE) The Coronavirus on the Respiratory Panel, DOES NOT test for the novel  Coronavirus (2019 nCoV)    Coronavirus HKU1 NOT DETECTED NOT DETECTED Final   Coronavirus NL63 NOT DETECTED NOT DETECTED Final   Coronavirus OC43 NOT DETECTED NOT DETECTED Final   Metapneumovirus NOT DETECTED NOT DETECTED Final   Rhinovirus / Enterovirus NOT DETECTED NOT DETECTED Final   Influenza A NOT DETECTED NOT DETECTED Final   Influenza B NOT DETECTED NOT DETECTED Final   Parainfluenza Virus 1 NOT DETECTED NOT DETECTED Final   Parainfluenza Virus 2 NOT DETECTED NOT DETECTED Final   Parainfluenza Virus 3 NOT DETECTED NOT DETECTED Final   Parainfluenza Virus 4 NOT DETECTED NOT DETECTED Final   Respiratory Syncytial Virus NOT DETECTED NOT DETECTED Final   Bordetella pertussis NOT DETECTED NOT DETECTED Final   Bordetella  Parapertussis NOT DETECTED NOT DETECTED Final   Chlamydophila pneumoniae NOT DETECTED NOT DETECTED Final   Mycoplasma pneumoniae NOT DETECTED NOT DETECTED Final    Comment: Performed at [facility], [address] [place], [place] [address]  Culture, Respiratory w Gram Stain (tracheal aspirate)     Status: None   Collection Time: 03/28/23  3:24 AM   Specimen: Tracheal Aspirate; Respiratory  Result Value Ref Range Status   Specimen Description  TRACHEAL ASPIRATE  Final   Special Requests NONE  Final   Gram Stain   Final    FEW WBC PRESENT, PREDOMINANTLY MONONUCLEAR RARE GRAM POSITIVE COCCI IN PAIRS RARE GRAM VARIABLE ROD Performed at [facility], [address], [place], [place] [address]    Culture RARE STREPTOCOCCUS MITIS/ORALIS  Final   Report Status 04/01/2023 FINAL  Final   Organism ID, Bacteria STREPTOCOCCUS MITIS/ORALIS  Final      Susceptibility   Streptococcus mitis/oralis - MIC*    TETRACYCLINE >=16 RESISTANT Resistant     VANCOMYCIN 0.5 SENSITIVE Sensitive     CLINDAMYCIN <=0.25 SENSITIVE Sensitive     PENICILLIN Value in next row Intermediate      INTERMEDIATE1.0    CEFTRIAXONE Value in next row Sensitive      SENSITIVE0.5    * RARE STREPTOCOCCUS MITIS/ORALIS     Radiology Studies: No results found.  Scheduled Meds: Continuous Infusions:  morphine Stopped (04/05/23 1756)     LOS: 10 days   Burnadette Pop, MD [facility] P11/30/2024, 7:56 AM

## 2023-04-06 NOTE — Progress Notes (Signed)
PT Cancellation Note  Patient Details Name: QUANIESHA MUKHERJEE MRN: [id number] DOB: 06/01/1945   Cancelled Treatment:    Reason Eval/Treat Not Completed: Pt on comfort care. Per MD, PT is no longer needed. Acute PT signing off. Please reconsult with any changes to POC.  Hilton Cork, PT, DPT Secure Chat Preferred  Rehab Office [phone]   Arturo Morton [facility] 04/06/2023, 7:40 AM

## 2023-04-06 NOTE — TOC Progression Note (Addendum)
Transition of Care [facility]) - Progression Note    Patient Details  Name: Kaylee [name] MRN: [id number] Date of Birth: 1946-02-07  Transition of Care [facility]) CM/SW Contact  Ronny Bacon, RN Phone Number: 04/06/2023, 1:40 PM  Clinical Narrative:   Per Palliative provider, family wants to take patient home with hospice using [facility]. Secure message to liaison for [facility] to inform of same.  1450: No response from [facility]. Call made to after hours on call liaison, awaiting return call.  1505: Spoke with [facility] [address], patient unable to be discharged today due to no nurse available to do admission and family needing DME. Care team updated on plan of care.  1625: Attending spoke with husband who is wants patient discharged home today. Patient verified she wants to go home today. Family wants to respect her wishes. They understand hospice will not see patient until 3 PM tomorrow. PTAR arranged from transportation home, forms sent to floor, aware of need for DNR form to given to PTAR.         Expected Discharge Plan and Services                                               Social Determinants of Health (SDOH) Interventions SDOH Screenings   Food Insecurity: Patient Unable To Answer (03/31/2023)  Housing: Patient Unable To Answer (03/31/2023)  Transportation Needs: Patient Unable To Answer (03/31/2023)  Utilities: Patient Unable To Answer (03/31/2023)  Financial Resource Strain: Low Risk  (02/08/2023)   Received from [facility]  Tobacco Use: Unknown (03/27/2023)  Health Literacy: Low Risk  (07/18/2021)   Received from [facility]    Readmission Risk Interventions     No data to display

## 2023-04-06 NOTE — Consult Note (Signed)
Consultation Note Date: 04/06/2023   Patient Name: Kaylee [name]  DOB: 02-16-1946  MRN: [id number]  Age / Sex: 77 y.o., female  PCP: Ignatius Specking, MD Referring Physician: Burnadette Pop, MD  Reason for Consultation: Establishing goals of care, "Was initially admitted for acute hypoxic respiratory failure, was intubated, extubated.  Respiratory status worsened so she wanted to be on full comfort care.  Currently on morphine drip but he is alert looks uncomfortable. "  HPI/Patient Profile: 77 y.o. female  with past medical history of atrial fibrillation, CHF, obesity, asthma, OSA and chronic respiratory failure on 2L is admitted for acute on chronic hypoxemic respiratory failure was admitted on 03/27/2023 with acute on chronic respiratory failure with hypercarbia/hypoxia, COVID19 pneumonia, and hypotension. After discussions with patient and family, patient opted for transition to full comfort measures.  Clinical Assessment and Goals of Care: I have reviewed medical records including EPIC notes, labs, and imaging. Received report from primary RN - no acute concerns.   Went to visit patient at bedside - husband and daughter present. Patient was lying in bed awake, alert, oriented, and able to participate in conversation. No respiratory distress, increased work of breathing, or secretions noted. She appears anxious - she wants to "go home."  Met with husband and daughter  to discuss diagnosis, prognosis, GOC, EOL wishes, disposition, and options.  I introduced Palliative Medicine as specialized medical care for people living with serious illness. It focuses on providing relief from the symptoms and stress of a serious illness. The goal is to improve quality of life for both the patient and the family.  Family confirm goal is for full comfort care. We talked about comfort measures in house and what that would  entail inclusive of medications to control pain, dyspnea, agitation, nausea, and itching. We discussed stopping all unnecessary measures such as blood draws, needle sticks, oxygen, antibiotics, CBGs/insulin, cardiac monitoring, IVF, and frequent vital signs. Education provided that other non-pharmacological interventions would be utilized for holistic support and comfort such as spiritual support if requested, repositioning, music therapy, offering comfort feeds, and/or therapeutic listening. All care would focus on how the patient is looking and feeling.    Provided education and counseling at length on the philosophy and benefits of hospice care. Discussed that it offers a holistic approach to care in the setting of end-stage illness, and is about supporting the patient where they are allowing nature to take it's course. Discussed the hospice team includes RNs, physicians, social workers, and chaplains. They can provide personal care, support for the family, and help keep patient out of the hospital as well as assist with DME needs for home hospice. Education provided on the difference between home vs residential hospice. Family are most interested in patient returning home with hospice. DME needs discussed - will need hospital bed prior to discharge.  Visit also consisted of discussions dealing with the complex and emotionally intense issues of symptom management and palliative care in the setting of serious and potentially life-threatening illness. Palliative care team will continue to support patient, patient's family, and medical team.  Discussed with patient/family the importance of continued conversation with each other and the medical providers regarding overall plan of care and treatment options, ensuring decisions are within the context of the patient's values and GOCs.    Questions and concerns were addressed. The patient/family was encouraged to call with questions and/or concerns. PMT card was  provided.  Notified by [facility] later that husband is not willing to  wait for DME delivery tomorrow for her discharge - family would like to take her home today. They do have a recliner she usually sleeps on, in an accessible space.   TOC and attending discussed discharge home without DME delivery or hospice staff able to meet/enroll her until tomorrow - family expressed understanding and still wish to take her home.   Primary Decision Maker: NEXT OF KIN - husband    SUMMARY OF RECOMMENDATIONS   Continue full comfort measures Continue DNR/DNI as previously documented - durable DNR form completed and placed in shadow chart. Copy was made and will be scanned into Vynca/ACP tab Discharge home with hospice today per family request - they understand DME delivery and hospice RN enrollment cannot be completed until tomorrow PMT will continue to follow peripherally. If there are any imminent needs please call the service directly   Code Status/Advance Care Planning: DNR  Palliative Prophylaxis:  Aspiration, Delirium Protocol, Oral Care, and Turn Reposition  Additional Recommendations (Limitations, Scope, Preferences): Full Comfort Care  Psycho-social/Spiritual:  Desire for further Chaplaincy support:no Created space and opportunity for patient and family to express thoughts and feelings regarding patient's current medical situation.  Emotional support and therapeutic listening provided.  Prognosis:  < 2 weeks  Discharge Planning: Home with Hospice      Primary Diagnoses: Present on Admission:  Respiratory failure (HCC)  Acute on chronic hypoxic respiratory failure (HCC)   I have reviewed the medical record, interviewed the patient and family, and examined the patient. The following aspects are pertinent.  Past Medical History:  Diagnosis Date   Arthritis    Asthma    Blind    ESRD (end stage renal disease) on dialysis (HCC)    Hypertension    Social History   Socioeconomic  History   Marital status: Married    Spouse name: Not on file   Number of children: Not on file   Years of education: Not on file   Highest education level: Not on file  Occupational History   Occupation: self employed  Tobacco Use   Smoking status: Never   Smokeless tobacco: Not on file  Substance and Sexual Activity   Alcohol use: No   Drug use: No   Sexual activity: Yes    Birth control/protection: Post-menopausal  Other Topics Concern   Not on file  Social History Narrative   Not on file   Social Determinants of Health   Financial Resource Strain: Low Risk  (02/08/2023)   [facility]   Overall Financial Resource Strain (CARDIA)    Difficulty of Paying Living Expenses: Not hard at all  Food Insecurity: Patient Unable To Answer (03/31/2023)   Hunger Vital Sign    Worried About Programme researcher, broadcasting/film/video in the Last Year: Patient unable to answer    Ran Out of Food in the Last Year: Patient unable to answer  Transportation Needs: Patient Unable To Answer (03/31/2023)   PRAPARE - Administrator, Civil Service (Medical): Patient unable to answer    Lack of Transportation (Non-Medical): Patient unable to answer  Physical Activity: Not on file  Stress: Not on file  Social Connections: Not on file   Family History  Problem Relation Age of Onset   Hepatitis Mother        C   Hypertension Mother    Coronary artery disease Father    Obesity Father    Hypertension Father        hypoglycemic  Scheduled Meds: Continuous Infusions:  morphine Stopped (04/05/23 1756)   PRN Meds:.acetaminophen **OR** acetaminophen, docusate, glycopyrrolate **OR** glycopyrrolate **OR** glycopyrrolate, lip balm, midazolam, morphine injection, morphine, mouth rinse, polyvinyl alcohol Medications Prior to Admission:  Prior to Admission medications   Medication Sig Start Date End Date Taking? Authorizing Provider  Amino Acids-Protein Hydrolys (FEEDING SUPPLEMENT, PRO-STAT  SUGAR FREE 64,) LIQD Take 30 mLs by mouth 2 (two) times daily.   Yes [provider]  buPROPion (WELLBUTRIN SR) 150 MG 12 hr tablet Take 150 mg by mouth 2 (two) times daily. 07/12/20  Yes [provider]  calcium-vitamin D (OSCAL WITH D) 500-5 MG-MCG tablet Take 1 tablet by mouth 2 (two) times daily.   Yes [provider]  carvedilol (COREG) 25 MG tablet Take 25 mg by mouth 2 (two) times daily with a meal.   Yes [provider]  cephALEXin (KEFLEX) 500 MG capsule Take 500 mg by mouth 2 (two) times daily.   Yes [provider]  diltiazem (CARDIZEM CD) 120 MG 24 hr capsule Take 1 capsule by mouth daily. 10/29/22  Yes [provider]  docusate sodium (COLACE) 100 MG capsule Take 100 mg by mouth daily.   Yes [provider]  ferrous sulfate 325 (65 FE) MG tablet Take 325 mg by mouth daily.   Yes [provider]  fluconazole (DIFLUCAN) 150 MG tablet Take 150 mg by mouth daily.   Yes [provider]  fluticasone-salmeterol (ADVAIR) 250-50 MCG/ACT AEPB Inhale 1 puff into the lungs in the morning and at bedtime.   Yes [provider]  hydrOXYzine (ATARAX) 25 MG tablet Take 25 mg by mouth 3 (three) times daily.   Yes [provider]  ipratropium-albuterol (DUONEB) 0.5-2.5 (3) MG/3ML SOLN Take 3 mLs by nebulization every 6 (six) hours as needed (wheezing, shortness of breath).   Yes [provider]  levothyroxine (SYNTHROID) 25 MCG tablet Take 25 mcg by mouth daily before breakfast. 01/04/22  Yes [provider]  losartan (COZAAR) 50 MG tablet Take 1 tablet by mouth daily.   Yes [provider]  Multiple Vitamin (MULTIVITAMIN) tablet Take 1 tablet by mouth daily.   Yes [provider]  ondansetron (ZOFRAN) 40 MG/20ML SOLN injection Inject 2 mLs into the vein every 6 (six) hours as needed for nausea or vomiting.   Yes [provider]  OXYGEN Inhale 2 L into the lungs  continuous.   Yes [provider]  polyethylene glycol (MIRALAX / GLYCOLAX) 17 g packet Take 17 g by mouth daily.   Yes [provider]  Semaglutide-Weight Management (WEGOVY) 0.25 MG/0.5ML SOAJ Inject 0.25 mg into the skin every Monday.   Yes [provider]  traMADol (ULTRAM) 50 MG tablet Take 1 tablet (50 mg total) by mouth every 6 (six) hours as needed. Patient taking differently: Take 50 mg by mouth 2 (two) times daily. 07/21/13  Yes Vickki Hearing, MD  Vitamins A & D (VITAMIN A & D) ointment Apply 1 Application topically every Monday, Wednesday, and Friday. Apply topically to buttocks every Monday, Wednesday and Friday.   Yes [provider]  albuterol (PROVENTIL HFA;VENTOLIN HFA) 108 (90 BASE) MCG/ACT inhaler Inhale 2 puffs into the lungs every 4 (four) hours as needed for wheezing or shortness of breath. Shortness of breath Patient not taking: Reported on 03/27/2023 05/11/12   Christiane Ha, MD  dextromethorphan-guaiFENesin [facility] DM) 30-600 MG per 12 hr tablet Take 1 tablet by mouth 2 (two) times daily.  For cough Patient not taking: Reported on 03/27/2023 05/11/12   Christiane Ha, MD  rivaroxaban (XARELTO) 20 MG TABS tablet Take 20 mg by mouth daily with supper. Patient not taking: Reported on 03/27/2023    [provider]   Allergies  Allergen Reactions   Tetanus Toxoid     REACTION: swelling   Review of Systems  Constitutional:  Positive for activity change and fatigue.  Respiratory:  Negative for shortness of breath.   Gastrointestinal:  Negative for nausea and vomiting.  Neurological:  Positive for weakness.  All other systems reviewed and are negative.   Physical Exam Vitals and nursing note reviewed.  Constitutional:      General: She is not in acute distress.    Appearance: She is ill-appearing.  Pulmonary:     Effort: No respiratory distress.  Skin:    General: Skin is warm and dry.  Psychiatric:         Mood and Affect: Mood is anxious.     Vital Signs: BP 118/70 (BP Location: Right Arm)   Pulse (!) 105   Temp 97.8 F (36.6 C) (Axillary)   Resp 17   Ht 5\' 4"  (1.626 m)   Wt (!) 175.9 kg   SpO2 100%   BMI 66.56 kg/m  Pain Scale: 0-10   Pain Score: 0-No pain   SpO2: SpO2: 100 % O2 Device:SpO2: 100 % O2 Flow Rate: .O2 Flow Rate (L/min): 2 L/min  IO: Intake/output summary:  Intake/Output Summary (Last 24 hours) at 04/06/2023 1249 Last data filed at 04/06/2023 0900 Gross per 24 hour  Intake 482.98 ml  Output 400 ml  Net 82.98 ml    LBM: Last BM Date : 04/05/23 Baseline Weight: Weight: (!) 171.8 kg Most recent weight: Weight: (!) 175.9 kg     Palliative Assessment/Data: PPS 20-30%     Time In: 1245 Time Out: 1445 Time Total: 90 minutes  Signed by: Haskel Khan, NP   Please contact Palliative Medicine Team phone at [phone] for questions and concerns.  For individual provider: See Amion  *Portions of this note are a verbal dictation therefore any spelling and/or grammatical errors are due to the "[employer] One" system interpretation.

## 2023-04-06 NOTE — Discharge Summary (Incomplete)
Physician Discharge Summary  Kaylee [name]:[id number] DOB: 02-05-1946 DOA: 03/27/2023  PCP: Ignatius Specking, MD  Admit date: 03/27/2023 Discharge date: 04/06/2023  Admitted From: Home Disposition:  Home  Discharge Condition:Stable CODE STATUS:FULL, DNR, Comfort Care Diet recommendation: Heart Healthy / Carb Modified / Regular / Dysphagia   Brief/Interim Summary:   Following problems were addressed during the hospitalization:   Discharge Diagnoses:  Principal Problem:   Respiratory failure (HCC) Active Problems:   Acute on chronic hypoxic respiratory failure (HCC)   ARDS (adult respiratory distress syndrome) (HCC)    Discharge Instructions  Discharge Instructions     Diet general   Complete by: As directed    Discharge instructions   Complete by: As directed    Please follow up with Hospice Services   No wound care   Complete by: As directed       Allergies as of 04/06/2023       Reactions   Tetanus Toxoid    REACTION: swelling        Medication List     STOP taking these medications    buPROPion 150 MG 12 hr tablet Commonly known as: WELLBUTRIN SR   calcium-vitamin D 500-5 MG-MCG tablet Commonly known as: OSCAL WITH D   carvedilol 25 MG tablet Commonly known as: COREG   cephALEXin 500 MG capsule Commonly known as: KEFLEX   diltiazem 120 MG 24 hr capsule Commonly known as: CARDIZEM CD   feeding supplement (PRO-STAT SUGAR FREE 64) Liqd   ferrous sulfate 325 (65 FE) MG tablet   fluconazole 150 MG tablet Commonly known as: DIFLUCAN   hydrOXYzine 25 MG tablet Commonly known as: ATARAX   levothyroxine 25 MCG tablet Commonly known as: SYNTHROID   losartan 50 MG tablet Commonly known as: COZAAR   ondansetron 40 MG/20ML Soln injection Commonly known as: ZOFRAN   rivaroxaban 20 MG Tabs tablet Commonly known as: XARELTO   traMADol 50 MG tablet Commonly known as: Ultram   Wegovy 0.25 MG/0.5ML Soaj Generic drug:  Semaglutide-Weight Management       TAKE these medications    albuterol 108 (90 Base) MCG/ACT inhaler Commonly known as: VENTOLIN HFA Inhale 2 puffs into the lungs every 4 (four) hours as needed for wheezing or shortness of breath. Shortness of breath   dextromethorphan-guaiFENesin 30-600 MG 12hr tablet Commonly known as: MUCINEX DM Take 1 tablet by mouth 2 (two) times daily. For cough   docusate sodium 100 MG capsule Commonly known as: COLACE Take 100 mg by mouth daily.   fluticasone-salmeterol 250-50 MCG/ACT Aepb Commonly known as: ADVAIR Inhale 1 puff into the lungs in the morning and at bedtime.   ipratropium-albuterol 0.5-2.5 (3) MG/3ML Soln Commonly known as: DUONEB Take 3 mLs by nebulization every 6 (six) hours as needed (wheezing, shortness of breath).   LORazepam 1 MG tablet Commonly known as: ATIVAN Take 1 tablet (1 mg total) by mouth every 4 (four) hours as needed for anxiety.   morphine 15 MG tablet Commonly known as: MSIR Take 1 tablet (15 mg total) by mouth every 2 (two) hours as needed for severe pain (pain score 7-10).   multivitamin tablet Take 1 tablet by mouth daily.   OXYGEN Inhale 2 L into the lungs continuous.   polyethylene glycol 17 g packet Commonly known as: MIRALAX / GLYCOLAX Take 17 g by mouth daily.   vitamin A & D ointment Apply 1 Application topically every Monday, Wednesday, and Friday. Apply topically to buttocks every Monday, Wednesday  and Friday.        Allergies  Allergen Reactions   Tetanus Toxoid     REACTION: swelling    Consultations:    Procedures/Studies: DG CHEST PORT 1 VIEW  Result Date: 03/30/2023 CLINICAL DATA:  77 year old female intubated.  COVID-19. EXAM: PORTABLE CHEST 1 VIEW COMPARISON:  Portable chest 03/28/2023 and earlier. FINDINGS: Portable AP semi upright view at 1118 hours. Endotracheal tube tip in satisfactory position between the clavicles and carina. Enteric tube courses to the abdomen, tip  not included. New left subclavian central line placed, tip at the SVC level. Lung volumes and mediastinal contours not significantly changed. Confluent, partially veiling bibasilar opacity has mildly progressed since 03/27/2023. No pneumothorax or pulmonary edema. Paucity of bowel gas. No acute osseous abnormality identified. IMPRESSION: 1. Satisfactory endotracheal and enteric tubes. New left subclavian central line placed, tip at the SVC. No pneumothorax. 2. Confluent bibasilar opacity with mild progression since 11/20 24 May reflect a combination of consolidation and pleural effusion. Electronically Signed   By: Kaylee [name] M.D.   On: 03/30/2023 11:35   ECHOCARDIOGRAM COMPLETE  Result Date: 03/29/2023    ECHOCARDIOGRAM REPORT   [name] Date of Exam: 03/29/2023 Medical Rec #:  [id number]           Height:       64.0 in Accession #:    [id number]          Weight:       399.9 lb Date of Birth:  October 14, 1945           BSA:          2.626 m Patient Age:    77 years            BP:           111/68 mmHg Patient Gender: F                   HR:           88 bpm. Exam Location:  Inpatient Procedure: 2D Echo, Color Doppler, Cardiac Doppler and Intracardiac            Opacification Agent Indications:    CHF  History:        Patient has prior history of Echocardiogram examinations, most                 recent 09/21/2020. CHF, Arrythmias:Atrial Fibrillation,                 Signs/Symptoms:Shortness of Breath; Risk Factors:Sleep Apnea and                 Covid-19.  Sonographer:    Milbert Coulter Referring Phys: [id number] STEPHANIE M REESE  Sonographer Comments: Patient is obese and echo performed with patient supine and on artificial respirator. Image acquisition challenging due to patient body habitus. IMPRESSIONS  1. Left ventricular ejection fraction, by estimation, is 60 to 65%. The left ventricle has normal function. The left ventricle has no regional wall motion abnormalities. There is mild left  ventricular hypertrophy. Left ventricular diastolic function could not be evaluated.  2. Right ventricular systolic function is normal. The right ventricular size is not well visualized. There is severely elevated pulmonary artery systolic pressure. The estimated right ventricular systolic pressure is 66.6 mmHg.  3. Left atrial size was mild to moderately dilated.  4. Right atrial size was mild to moderately dilated.  5. The mitral valve is degenerative.  Trivial mitral valve regurgitation. No evidence of mitral stenosis.  6. Tricuspid valve regurgitation is mild to moderate.  7. The aortic valve was not well visualized. Aortic valve regurgitation is not visualized. No aortic stenosis is present.  8. The inferior vena cava is dilated in size with <50% respiratory variability, suggesting right atrial pressure of 15 mmHg. Comparison(s): Prior images unable to be directly viewed, comparison made by report only. Echo at [facility] 2022 with EF 65-70%, moderate biatrial enlargement, no significant valve disease. Conclusion(s)/Recommendation(s): Technically challenging study, even with use of echo contrast. Grossly normal LV function. Severely elevated RA pressures. RV not well visualized but grossly normal function. FINDINGS  Left Ventricle: Left ventricular ejection fraction, by estimation, is 60 to 65%. The left ventricle has normal function. The left ventricle has no regional wall motion abnormalities. Definity contrast agent was given IV to delineate the left ventricular  endocardial borders. The left ventricular internal cavity size was normal in size. There is mild left ventricular hypertrophy. Left ventricular diastolic function could not be evaluated. Right Ventricle: The right ventricular size is not well visualized. Right vetricular wall thickness was not well visualized. Right ventricular systolic function is normal. There is severely elevated pulmonary artery systolic pressure. The tricuspid regurgitant velocity is  3.59 m/s, and with an assumed right atrial pressure of 15 mmHg, the estimated right ventricular systolic pressure is 66.6 mmHg. Left Atrium: Left atrial size was mild to moderately dilated. Right Atrium: Right atrial size was mild to moderately dilated. Pericardium: Trivial pericardial effusion is present. Presence of epicardial fat layer. Mitral Valve: The mitral valve is degenerative in appearance. Trivial mitral valve regurgitation. No evidence of mitral valve stenosis. Tricuspid Valve: The tricuspid valve is not well visualized. Tricuspid valve regurgitation is mild to moderate. No evidence of tricuspid stenosis. Aortic Valve: The aortic valve was not well visualized. Aortic valve regurgitation is not visualized. No aortic stenosis is present. Aortic valve mean gradient measures 4.0 mmHg. Aortic valve peak gradient measures 6.5 mmHg. Aortic valve area, by VTI measures 1.73 cm. Pulmonic Valve: The pulmonic valve was not well visualized. Pulmonic valve regurgitation is trivial. No evidence of pulmonic stenosis. Aorta: The aortic root was not well visualized, the ascending aorta was not well visualized and the aortic arch was not well visualized. Venous: The inferior vena cava is dilated in size with less than 50% respiratory variability, suggesting right atrial pressure of 15 mmHg. IAS/Shunts: The interatrial septum was not well visualized. Additional Comments: There is a small pleural effusion in both left and right lateral regions.  LEFT VENTRICLE PLAX 2D LVIDd:         4.60 cm LVIDs:         3.40 cm LV PW:         1.10 cm LV IVS:        1.10 cm LVOT diam:     1.80 cm LV SV:         37 LV SV Index:   14 LVOT Area:     2.54 cm  RIGHT VENTRICLE RV Basal diam:  3.60 cm RV Mid diam:    3.20 cm RV S prime:     13.70 cm/s TAPSE (M-mode): 1.4 cm LEFT ATRIUM             Index        RIGHT ATRIUM           Index LA diam:        4.40 cm 1.68 cm/m  RA Area:     25.30 cm LA Vol (A2C):   65.8 ml 25.05 ml/m  RA Volume:    79.40 ml  30.23 ml/m LA Vol (A4C):   63.5 ml 24.18 ml/m LA Biplane Vol: 66.0 ml 25.13 ml/m  AORTIC VALVE AV Area (Vmax):    1.73 cm AV Area (Vmean):   1.58 cm AV Area (VTI):     1.73 cm AV Vmax:           127.00 cm/s AV Vmean:          87.000 cm/s AV VTI:            0.216 m AV Peak Grad:      6.5 mmHg AV Mean Grad:      4.0 mmHg LVOT Vmax:         86.50 cm/s LVOT Vmean:        54.000 cm/s LVOT VTI:          0.147 m LVOT/AV VTI ratio: 0.68  AORTA Ao Root diam: 3.20 cm TRICUSPID VALVE TR Peak grad:   51.6 mmHg TR Vmax:        359.00 cm/s  SHUNTS Systemic VTI:  0.15 m Systemic Diam: 1.80 cm Jodelle Red MD Electronically signed by Jodelle Red MD Signature Date/Time: 03/29/2023/3:24:11 PM    Final    DG Chest Port 1 View  Result Date: 03/28/2023 CLINICAL DATA:  Endotracheal tube placement. EXAM: PORTABLE CHEST 1 VIEW COMPARISON:  March 27, 2023. FINDINGS: Stable cardiomegaly with central pulmonary vascular congestion. Endotracheal and nasogastric tubes are in grossly good position. Stable bibasilar atelectasis and effusions are noted. IMPRESSION: Stable support apparatus. Stable bibasilar atelectasis and effusions. Stable central pulmonary vascular congestion. Electronically Signed   By: Lupita Raider M.D.   On: 03/28/2023 10:47   DG Abd Portable 1V  Result Date: 03/28/2023 CLINICAL DATA:  Nasogastric tube placement. EXAM: PORTABLE ABDOMEN - 1 VIEW COMPARISON:  Portable chest dated 03/27/2023 FINDINGS: Nasogastric tube extending into the stomach with its tip in the mid to distal stomach. The side hole is not well visualized. The bowel-gas pattern in the upper abdomen is unremarkable. The remainder of the abdomen is not included. Endotracheal tube tip 3 cm above the carina. Stable enlarged cardiac silhouette and prominence of the pulmonary vasculature and interstitial markings. Bilateral pleural effusions with associated bilateral pleural effusions with associated dense opacity at  both medial lung bases. Thoracic spine degenerative changes. IMPRESSION: 1. Nasogastric tube tip in the mid to distal stomach. 2. Stable cardiomegaly, pulmonary vascular congestion and mild interstitial edema. 3. Stable bilateral pleural effusions with associated dense opacity at both medial lung bases, likely due to atelectasis. Pneumonia could also have this appearance. Electronically Signed   By: Beckie Salts M.D.   On: 03/28/2023 10:47   DG CHEST PORT 1 VIEW  Result Date: 03/27/2023 CLINICAL DATA:  Intubation EXAM: PORTABLE CHEST 1 VIEW COMPARISON:  03/27/2019 T4 FINDINGS: Endotracheal tube and NG tube remain in place, unchanged. Cardiomegaly with vascular congestion. Layering bilateral effusions, likely greater on the right. Bibasilar atelectasis. IMPRESSION: Support devices stable. Cardiomegaly, vascular congestion. Layering bilateral effusions with bibasilar atelectasis. Electronically Signed   By: Charlett Nose M.D.   On: 03/27/2023 22:01   DG Chest Port 1 View  Result Date: 03/27/2023 CLINICAL DATA:  ET tube, OG tube placement EXAM: PORTABLE CHEST 1 VIEW COMPARISON:  03/27/2023 FINDINGS: Endotracheal tube is 2.3 cm above the carina. OG tube enters the stomach. Cardiomegaly with vascular congestion. Small  bilateral effusions with bibasilar atelectasis. No acute bony abnormality. IMPRESSION: Endotracheal tube 2.3 cm above the carina. OG tube enters the stomach. Cardiomegaly, vascular congestion. Layering effusions with bibasilar atelectasis. Electronically Signed   By: Charlett Nose M.D.   On: 03/27/2023 22:00   DG Chest Port 1 View  Result Date: 03/27/2023 CLINICAL DATA:  Hypoxia.  Recent positive COVID test. EXAM: PORTABLE CHEST 1 VIEW COMPARISON:  Chest radiograph dated February 07, 2023. FINDINGS: Patient is rotated to the right. Small layering bilateral pleural effusions with associated basilar atelectasis. These findings are new since the prior exam. The cardiac silhouette is obscured. No  pneumothorax. No acute osseous abnormality. IMPRESSION: Small layering bilateral pleural effusions with associated basilar atelectasis. Underlying infiltrate can not be excluded. Electronically Signed   By: Hart Robinsons M.D.   On: 03/27/2023 16:25      Subjective:   Discharge Exam: Vitals:   04/06/23 0500 04/06/23 0900  BP: 134/83 118/70  Pulse: (!) 107 (!) 105  Resp: 16 17  Temp:    SpO2: 100% 100%   Vitals:   04/05/23 1300 04/05/23 1640 04/06/23 0500 04/06/23 0900  BP:  112/76 134/83 118/70  Pulse: (!) 109 (!) 118 (!) 107 (!) 105  Resp: 11 18 16 17   Temp:      TempSrc:      SpO2: 99% 99% 100% 100%  Weight:      Height:        General: Pt is alert, awake, not in acute distress Cardiovascular: RRR, S1/S2 +, no rubs, no gallops Respiratory: CTA bilaterally, no wheezing, no rhonchi Abdominal: Soft, NT, ND, bowel sounds + Extremities: no edema, no cyanosis    The results of significant diagnostics from this hospitalization (including imaging, microbiology, ancillary and laboratory) are listed below for reference.     Microbiology: Recent Results (from the past 240 hour(s))  MRSA Next Gen by PCR, Nasal     Status: None   Collection Time: 03/27/23  8:59 PM   Specimen: Nasal Mucosa; Nasal Swab  Result Value Ref Range Status   MRSA by PCR Next Gen NOT DETECTED NOT DETECTED Final    Comment: (NOTE) The GeneXpert MRSA Assay (FDA approved for NASAL specimens only), is one component of a comprehensive MRSA colonization surveillance program. It is not intended to diagnose MRSA infection nor to guide or monitor treatment for MRSA infections. Test performance is not FDA approved in patients less than 28 years old. Performed at [facility], [address] [place], [place] [address]   Respiratory (~20 pathogens) panel by PCR     Status: None   Collection Time: 03/28/23 12:32 AM   Specimen: Nasopharyngeal Swab; Respiratory  Result Value Ref Range Status    Adenovirus NOT DETECTED NOT DETECTED Final   Coronavirus 229E NOT DETECTED NOT DETECTED Final    Comment: (NOTE) The Coronavirus on the Respiratory Panel, DOES NOT test for the novel  Coronavirus (2019 nCoV)    Coronavirus HKU1 NOT DETECTED NOT DETECTED Final   Coronavirus NL63 NOT DETECTED NOT DETECTED Final   Coronavirus OC43 NOT DETECTED NOT DETECTED Final   Metapneumovirus NOT DETECTED NOT DETECTED Final   Rhinovirus / Enterovirus NOT DETECTED NOT DETECTED Final   Influenza A NOT DETECTED NOT DETECTED Final   Influenza B NOT DETECTED NOT DETECTED Final   Parainfluenza Virus 1 NOT DETECTED NOT DETECTED Final   Parainfluenza Virus 2 NOT DETECTED NOT DETECTED Final   Parainfluenza Virus 3 NOT DETECTED NOT DETECTED Final  Parainfluenza Virus 4 NOT DETECTED NOT DETECTED Final   Respiratory Syncytial Virus NOT DETECTED NOT DETECTED Final   Bordetella pertussis NOT DETECTED NOT DETECTED Final   Bordetella Parapertussis NOT DETECTED NOT DETECTED Final   Chlamydophila pneumoniae NOT DETECTED NOT DETECTED Final   Mycoplasma pneumoniae NOT DETECTED NOT DETECTED Final    Comment: Performed at [facility], [address] [place], [place] [address]  Culture, Respiratory w Gram Stain (tracheal aspirate)     Status: None   Collection Time: 03/28/23  3:24 AM   Specimen: Tracheal Aspirate; Respiratory  Result Value Ref Range Status   Specimen Description TRACHEAL ASPIRATE  Final   Special Requests NONE  Final   Gram Stain   Final    FEW WBC PRESENT, PREDOMINANTLY MONONUCLEAR RARE GRAM POSITIVE COCCI IN PAIRS RARE GRAM VARIABLE ROD Performed at [facility], [address] [place], [place] [address]    Culture RARE STREPTOCOCCUS MITIS/ORALIS  Final   Report Status 04/01/2023 FINAL  Final   Organism ID, Bacteria STREPTOCOCCUS MITIS/ORALIS  Final      Susceptibility   Streptococcus mitis/oralis - MIC*    TETRACYCLINE >=16 RESISTANT Resistant     VANCOMYCIN 0.5 SENSITIVE  Sensitive     CLINDAMYCIN <=0.25 SENSITIVE Sensitive     PENICILLIN Value in next row Intermediate      INTERMEDIATE1.0    CEFTRIAXONE Value in next row Sensitive      SENSITIVE0.5    * RARE STREPTOCOCCUS MITIS/ORALIS     Labs: BNP (last 3 results) Recent Labs    03/27/23 1305  BNP 353.0*   Basic Metabolic Panel: Recent Labs  Lab 04/02/23 0453 04/02/23 0906 04/03/23 0348 04/03/23 1632 04/04/23 0351 04/04/23 1746 04/05/23 0559  NA  --    < > 148* 150* 145 148* 148*  K  --    < > 3.8 3.7 3.4* 3.7 3.5  CL  --    < > 108 105 101 103 102  CO2  --    < > 30 34* 32 32 31  GLUCOSE  --    < > 178* 220* 187* 214* 240*  BUN  --    < > 94* 92* 93* 103* 97*  CREATININE  --    < > 0.95 1.03* 0.95 0.91 1.11*  CALCIUM  --    < > 8.3* 8.2* 8.7* 8.8* 9.1  MG 2.4  --   --   --   --   --   --   PHOS 3.2  --   --   --   --   --   --    < > = values in this interval not displayed.   Liver Function Tests: No results for input(s): "AST", "ALT", "ALKPHOS", "BILITOT", "PROT", "ALBUMIN" in the last 168 hours. No results for input(s): "LIPASE", "AMYLASE" in the last 168 hours. No results for input(s): "AMMONIA" in the last 168 hours. CBC: Recent Labs  Lab 04/01/23 0420 04/01/23 1149 04/02/23 0453 04/02/23 0906 04/03/23 0348 04/04/23 0351 04/05/23 0559  WBC 12.5*  --  9.2  --  9.5 6.7 7.1  HGB 8.7*   < > 7.4* 8.5* 7.8* 8.5* 9.1*  HCT 29.8*   < > 25.3* 25.0* 25.9* 26.9* 28.9*  MCV 93.7  --  91.0  --  89.0 87.1 86.5  PLT 219  --  175  --  183 169 185   < > = values in this interval not displayed.  Cardiac Enzymes: No results for input(s): "CKTOTAL", "CKMB", "CKMBINDEX", "TROPONINI" in the last 168 hours. BNP: Invalid input(s): "POCBNP" CBG: Recent Labs  Lab 04/04/23 1909 04/04/23 2321 04/05/23 0308 04/05/23 0723 04/05/23 1112  GLUCAP 194* 226* 211* 206* 163*   D-Dimer No results for input(s): "DDIMER" in the last 72 hours. Hgb A1c No results for input(s): "HGBA1C" in the  last 72 hours. Lipid Profile No results for input(s): "CHOL", "HDL", "LDLCALC", "TRIG", "CHOLHDL", "LDLDIRECT" in the last 72 hours. Thyroid function studies No results for input(s): "TSH", "T4TOTAL", "T3FREE", "THYROIDAB" in the last 72 hours.  Invalid input(s): "FREET3" Anemia work up No results for input(s): "VITAMINB12", "FOLATE", "FERRITIN", "TIBC", "IRON", "RETICCTPCT" in the last 72 hours. Urinalysis No results found for: "COLORURINE", "APPEARANCEUR", "LABSPEC", "PHURINE", "GLUCOSEU", "HGBUR", "BILIRUBINUR", "KETONESUR", "PROTEINUR", "UROBILINOGEN", "NITRITE", "LEUKOCYTESUR" Sepsis Labs Recent Labs  Lab 04/02/23 0453 04/03/23 0348 04/04/23 0351 04/05/23 0559  WBC 9.2 9.5 6.7 7.1   Microbiology Recent Results (from the past 240 hour(s))  MRSA Next Gen by PCR, Nasal     Status: None   Collection Time: 03/27/23  8:59 PM   Specimen: Nasal Mucosa; Nasal Swab  Result Value Ref Range Status   MRSA by PCR Next Gen NOT DETECTED NOT DETECTED Final    Comment: (NOTE) The GeneXpert MRSA Assay (FDA approved for NASAL specimens only), is one component of a comprehensive MRSA colonization surveillance program. It is not intended to diagnose MRSA infection nor to guide or monitor treatment for MRSA infections. Test performance is not FDA approved in patients less than 54 years old. Performed at [facility], [address], [place], [place] [address]   Respiratory (~20 pathogens) panel by PCR     Status: None   Collection Time: 03/28/23 12:32 AM   Specimen: Nasopharyngeal Swab; Respiratory  Result Value Ref Range Status   Adenovirus NOT DETECTED NOT DETECTED Final   Coronavirus 229E NOT DETECTED NOT DETECTED Final    Comment: (NOTE) The Coronavirus on the Respiratory Panel, DOES NOT test for the novel  Coronavirus (2019 nCoV)    Coronavirus HKU1 NOT DETECTED NOT DETECTED Final   Coronavirus NL63 NOT DETECTED NOT DETECTED Final   Coronavirus OC43 NOT DETECTED NOT DETECTED  Final   Metapneumovirus NOT DETECTED NOT DETECTED Final   Rhinovirus / Enterovirus NOT DETECTED NOT DETECTED Final   Influenza A NOT DETECTED NOT DETECTED Final   Influenza B NOT DETECTED NOT DETECTED Final   Parainfluenza Virus 1 NOT DETECTED NOT DETECTED Final   Parainfluenza Virus 2 NOT DETECTED NOT DETECTED Final   Parainfluenza Virus 3 NOT DETECTED NOT DETECTED Final   Parainfluenza Virus 4 NOT DETECTED NOT DETECTED Final   Respiratory Syncytial Virus NOT DETECTED NOT DETECTED Final   Bordetella pertussis NOT DETECTED NOT DETECTED Final   Bordetella Parapertussis NOT DETECTED NOT DETECTED Final   Chlamydophila pneumoniae NOT DETECTED NOT DETECTED Final   Mycoplasma pneumoniae NOT DETECTED NOT DETECTED Final    Comment: Performed at [facility], [address], [place], [place] [address]  Culture, Respiratory w Gram Stain (tracheal aspirate)     Status: None   Collection Time: 03/28/23  3:24 AM   Specimen: Tracheal Aspirate; Respiratory  Result Value Ref Range Status   Specimen Description TRACHEAL ASPIRATE  Final   Special Requests NONE  Final   Gram Stain   Final    FEW WBC PRESENT, PREDOMINANTLY MONONUCLEAR RARE GRAM POSITIVE COCCI IN PAIRS RARE GRAM VARIABLE ROD Performed at [facility], [address] [place], [place] [address]    Culture RARE STREPTOCOCCUS MITIS/ORALIS  Final   Report Status 04/01/2023 FINAL  Final   Organism ID, Bacteria STREPTOCOCCUS MITIS/ORALIS  Final      Susceptibility   Streptococcus mitis/oralis - MIC*    TETRACYCLINE >=16 RESISTANT Resistant     VANCOMYCIN 0.5 SENSITIVE Sensitive     CLINDAMYCIN <=0.25 SENSITIVE Sensitive     PENICILLIN Value in next row Intermediate      INTERMEDIATE1.0    CEFTRIAXONE Value in next row Sensitive      SENSITIVE0.5    * RARE STREPTOCOCCUS MITIS/ORALIS    Please note: You were cared for by a hospitalist during your hospital stay. Once you are discharged, your primary care physician will  handle any further medical issues. Please note that NO REFILLS for any discharge medications will be authorized once you are discharged, as it is imperative that you return to your primary care physician (or establish a relationship with a primary care physician if you do not have one) for your post hospital discharge needs so that they can reassess your need for medications and monitor your lab values.    Time coordinating discharge: 40 minutes  SIGNED:   Burnadette Pop, MD  [facility] 04/06/2023, 4:27 PM Pager [phone]  If 7PM-7AM, please contact night-coverage [email or web address] [facility] Physician Discharge Summary  Kaylee [name] IHK:[id number] DOB: [birth year] DOA: 03/27/2023  PCP: Ignatius Specking, MD  Admit date: 03/27/2023 Discharge date: 04/06/2023  Admitted From: Home Disposition:  Home  Discharge Condition:Stable CODE STATUS:FULL, DNR, Comfort Care Diet recommendation: Heart Healthy / Carb Modified / Regular / Dysphagia   Brief/Interim Summary:   Following problems were addressed during the hospitalization:   Discharge Diagnoses:  Principal Problem:   Respiratory failure (HCC) Active Problems:   Acute on chronic hypoxic respiratory failure (HCC)   ARDS (adult respiratory distress syndrome) (HCC)    Discharge Instructions  Discharge Instructions     Diet general   Complete by: As directed    Discharge instructions   Complete by: As directed    Please follow up with Hospice Services   No wound care   Complete by: As directed       Allergies as of 04/06/2023       Reactions   Tetanus Toxoid    REACTION: swelling        Medication List     STOP taking these medications    buPROPion 150 MG 12 hr tablet Commonly known as: WELLBUTRIN SR   calcium-vitamin D 500-5 MG-MCG tablet Commonly known as: OSCAL WITH D   carvedilol 25 MG tablet Commonly known as: COREG   cephALEXin 500 MG capsule Commonly known as: KEFLEX    diltiazem 120 MG 24 hr capsule Commonly known as: CARDIZEM CD   feeding supplement (PRO-STAT SUGAR FREE 64) Liqd   ferrous sulfate 325 (65 FE) MG tablet   fluconazole 150 MG tablet Commonly known as: DIFLUCAN   hydrOXYzine 25 MG tablet Commonly known as: ATARAX   levothyroxine 25 MCG tablet Commonly known as: SYNTHROID   losartan 50 MG tablet Commonly known as: COZAAR   ondansetron 40 MG/20ML Soln injection Commonly known as: ZOFRAN   rivaroxaban 20 MG Tabs tablet Commonly known as: XARELTO   traMADol 50 MG tablet Commonly known as: Ultram   Wegovy 0.25 MG/0.5ML Soaj Generic drug: Semaglutide-Weight Management       TAKE these medications    albuterol 108 (90 Base)  MCG/ACT inhaler Commonly known as: VENTOLIN HFA Inhale 2 puffs into the lungs every 4 (four) hours as needed for wheezing or shortness of breath. Shortness of breath   dextromethorphan-guaiFENesin 30-600 MG 12hr tablet Commonly known as: MUCINEX DM Take 1 tablet by mouth 2 (two) times daily. For cough   docusate sodium 100 MG capsule Commonly known as: COLACE Take 100 mg by mouth daily.   fluticasone-salmeterol 250-50 MCG/ACT Aepb Commonly known as: ADVAIR Inhale 1 puff into the lungs in the morning and at bedtime.   ipratropium-albuterol 0.5-2.5 (3) MG/3ML Soln Commonly known as: DUONEB Take 3 mLs by nebulization every 6 (six) hours as needed (wheezing, shortness of breath).   LORazepam 1 MG tablet Commonly known as: ATIVAN Take 1 tablet (1 mg total) by mouth every 4 (four) hours as needed for anxiety.   morphine 15 MG tablet Commonly known as: MSIR Take 1 tablet (15 mg total) by mouth every 2 (two) hours as needed for severe pain (pain score 7-10).   multivitamin tablet Take 1 tablet by mouth daily.   OXYGEN Inhale 2 L into the lungs continuous.   polyethylene glycol 17 g packet Commonly known as: MIRALAX / GLYCOLAX Take 17 g by mouth daily.   vitamin A & D ointment Apply 1  Application topically every Monday, Wednesday, and Friday. Apply topically to buttocks every Monday, Wednesday and Friday.        Allergies  Allergen Reactions   Tetanus Toxoid     REACTION: swelling    Consultations:    Procedures/Studies: DG CHEST PORT 1 VIEW  Result Date: 03/30/2023 CLINICAL DATA:  77 year old female intubated.  COVID-19. EXAM: PORTABLE CHEST 1 VIEW COMPARISON:  Portable chest 03/28/2023 and earlier. FINDINGS: Portable AP semi upright view at 1118 hours. Endotracheal tube tip in satisfactory position between the clavicles and carina. Enteric tube courses to the abdomen, tip not included. New left subclavian central line placed, tip at the SVC level. Lung volumes and mediastinal contours not significantly changed. Confluent, partially veiling bibasilar opacity has mildly progressed since 03/27/2023. No pneumothorax or pulmonary edema. Paucity of bowel gas. No acute osseous abnormality identified. IMPRESSION: 1. Satisfactory endotracheal and enteric tubes. New left subclavian central line placed, tip at the SVC. No pneumothorax. 2. Confluent bibasilar opacity with mild progression since 11/20 24 May reflect a combination of consolidation and pleural effusion. Electronically Signed   By: Kaylee [name] M.D.   On: 03/30/2023 11:35   ECHOCARDIOGRAM COMPLETE  Result Date: 03/29/2023    ECHOCARDIOGRAM REPORT   Patient Name:   Kaylee [name] [facility] Date of Exam: 03/29/2023 Medical Rec #:  [id number]           Height:       64.0 in Accession #:    [id number]          Weight:       399.9 lb Date of Birth:  [birth year]           BSA:          2.626 m Patient Age:    77 years            BP:           111/68 mmHg Patient Gender: F                   HR:           88 bpm. Exam Location:  Inpatient Procedure: 2D Echo, Color Doppler, Cardiac Doppler and Intracardiac  Opacification Agent Indications:    CHF  History:        Patient has prior history of Echocardiogram examinations, most                  recent 09/21/2020. CHF, Arrythmias:Atrial Fibrillation,                 Signs/Symptoms:Shortness of Breath; Risk Factors:Sleep Apnea and                 Covid-19.  Sonographer:    Milbert Coulter Referring Phys: [id number] STEPHANIE M REESE  Sonographer Comments: Patient is obese and echo performed with patient supine and on artificial respirator. Image acquisition challenging due to patient body habitus. IMPRESSIONS  1. Left ventricular ejection fraction, by estimation, is 60 to 65%. The left ventricle has normal function. The left ventricle has no regional wall motion abnormalities. There is mild left ventricular hypertrophy. Left ventricular diastolic function could not be evaluated.  2. Right ventricular systolic function is normal. The right ventricular size is not well visualized. There is severely elevated pulmonary artery systolic pressure. The estimated right ventricular systolic pressure is 66.6 mmHg.  3. Left atrial size was mild to moderately dilated.  4. Right atrial size was mild to moderately dilated.  5. The mitral valve is degenerative. Trivial mitral valve regurgitation. No evidence of mitral stenosis.  6. Tricuspid valve regurgitation is mild to moderate.  7. The aortic valve was not well visualized. Aortic valve regurgitation is not visualized. No aortic stenosis is present.  8. The inferior vena cava is dilated in size with <50% respiratory variability, suggesting right atrial pressure of 15 mmHg. Comparison(s): Prior images unable to be directly viewed, comparison made by report only. Echo at [facility] 2022 with EF 65-70%, moderate biatrial enlargement, no significant valve disease. Conclusion(s)/Recommendation(s): Technically challenging study, even with use of echo contrast. Grossly normal LV function. Severely elevated RA pressures. RV not well visualized but grossly normal function. FINDINGS  Left Ventricle: Left ventricular ejection fraction, by estimation, is 60 to 65%. The left ventricle  has normal function. The left ventricle has no regional wall motion abnormalities. Definity contrast agent was given IV to delineate the left ventricular  endocardial borders. The left ventricular internal cavity size was normal in size. There is mild left ventricular hypertrophy. Left ventricular diastolic function could not be evaluated. Right Ventricle: The right ventricular size is not well visualized. Right vetricular wall thickness was not well visualized. Right ventricular systolic function is normal. There is severely elevated pulmonary artery systolic pressure. The tricuspid regurgitant velocity is 3.59 m/s, and with an assumed right atrial pressure of 15 mmHg, the estimated right ventricular systolic pressure is 66.6 mmHg. Left Atrium: Left atrial size was mild to moderately dilated. Right Atrium: Right atrial size was mild to moderately dilated. Pericardium: Trivial pericardial effusion is present. Presence of epicardial fat layer. Mitral Valve: The mitral valve is degenerative in appearance. Trivial mitral valve regurgitation. No evidence of mitral valve stenosis. Tricuspid Valve: The tricuspid valve is not well visualized. Tricuspid valve regurgitation is mild to moderate. No evidence of tricuspid stenosis. Aortic Valve: The aortic valve was not well visualized. Aortic valve regurgitation is not visualized. No aortic stenosis is present. Aortic valve mean gradient measures 4.0 mmHg. Aortic valve peak gradient measures 6.5 mmHg. Aortic valve area, by VTI measures 1.73 cm. Pulmonic Valve: The pulmonic valve was not well visualized. Pulmonic valve regurgitation is trivial. No evidence of pulmonic stenosis. Aorta: The aortic root was  not well visualized, the ascending aorta was not well visualized and the aortic arch was not well visualized. Venous: The inferior vena cava is dilated in size with less than 50% respiratory variability, suggesting right atrial pressure of 15 mmHg. IAS/Shunts: The interatrial  septum was not well visualized. Additional Comments: There is a small pleural effusion in both left and right lateral regions.  LEFT VENTRICLE PLAX 2D LVIDd:         4.60 cm LVIDs:         3.40 cm LV PW:         1.10 cm LV IVS:        1.10 cm LVOT diam:     1.80 cm LV SV:         37 LV SV Index:   14 LVOT Area:     2.54 cm  RIGHT VENTRICLE RV Basal diam:  3.60 cm RV Mid diam:    3.20 cm RV S prime:     13.70 cm/s TAPSE (M-mode): 1.4 cm LEFT ATRIUM             Index        RIGHT ATRIUM           Index LA diam:        4.40 cm 1.68 cm/m   RA Area:     25.30 cm LA Vol (A2C):   65.8 ml 25.05 ml/m  RA Volume:   79.40 ml  30.23 ml/m LA Vol (A4C):   63.5 ml 24.18 ml/m LA Biplane Vol: 66.0 ml 25.13 ml/m  AORTIC VALVE AV Area (Vmax):    1.73 cm AV Area (Vmean):   1.58 cm AV Area (VTI):     1.73 cm AV Vmax:           127.00 cm/s AV Vmean:          87.000 cm/s AV VTI:            0.216 m AV Peak Grad:      6.5 mmHg AV Mean Grad:      4.0 mmHg LVOT Vmax:         86.50 cm/s LVOT Vmean:        54.000 cm/s LVOT VTI:          0.147 m LVOT/AV VTI ratio: 0.68  AORTA Ao Root diam: 3.20 cm TRICUSPID VALVE TR Peak grad:   51.6 mmHg TR Vmax:        359.00 cm/s  SHUNTS Systemic VTI:  0.15 m Systemic Diam: 1.80 cm Jodelle Red MD Electronically signed by Jodelle Red MD Signature Date/Time: 03/29/2023/3:24:11 PM    Final    DG Chest Port 1 View  Result Date: 03/28/2023 CLINICAL DATA:  Endotracheal tube placement. EXAM: PORTABLE CHEST 1 VIEW COMPARISON:  March 27, 2023. FINDINGS: Stable cardiomegaly with central pulmonary vascular congestion. Endotracheal and nasogastric tubes are in grossly good position. Stable bibasilar atelectasis and effusions are noted. IMPRESSION: Stable support apparatus. Stable bibasilar atelectasis and effusions. Stable central pulmonary vascular congestion. Electronically Signed   By: Lupita Raider M.D.   On: 03/28/2023 10:47   DG Abd Portable 1V  Result Date:  03/28/2023 CLINICAL DATA:  Nasogastric tube placement. EXAM: PORTABLE ABDOMEN - 1 VIEW COMPARISON:  Portable chest dated 03/27/2023 FINDINGS: Nasogastric tube extending into the stomach with its tip in the mid to distal stomach. The side hole is not well visualized. The bowel-gas pattern in the upper abdomen is unremarkable. The remainder of the  abdomen is not included. Endotracheal tube tip 3 cm above the carina. Stable enlarged cardiac silhouette and prominence of the pulmonary vasculature and interstitial markings. Bilateral pleural effusions with associated bilateral pleural effusions with associated dense opacity at both medial lung bases. Thoracic spine degenerative changes. IMPRESSION: 1. Nasogastric tube tip in the mid to distal stomach. 2. Stable cardiomegaly, pulmonary vascular congestion and mild interstitial edema. 3. Stable bilateral pleural effusions with associated dense opacity at both medial lung bases, likely due to atelectasis. Pneumonia could also have this appearance. Electronically Signed   By: Beckie Salts M.D.   On: 03/28/2023 10:47   DG CHEST PORT 1 VIEW  Result Date: 03/27/2023 CLINICAL DATA:  Intubation EXAM: PORTABLE CHEST 1 VIEW COMPARISON:  03/27/2019 T4 FINDINGS: Endotracheal tube and NG tube remain in place, unchanged. Cardiomegaly with vascular congestion. Layering bilateral effusions, likely greater on the right. Bibasilar atelectasis. IMPRESSION: Support devices stable. Cardiomegaly, vascular congestion. Layering bilateral effusions with bibasilar atelectasis. Electronically Signed   By: Charlett Nose M.D.   On: 03/27/2023 22:01   DG Chest Port 1 View  Result Date: 03/27/2023 CLINICAL DATA:  ET tube, OG tube placement EXAM: PORTABLE CHEST 1 VIEW COMPARISON:  03/27/2023 FINDINGS: Endotracheal tube is 2.3 cm above the carina. OG tube enters the stomach. Cardiomegaly with vascular congestion. Small bilateral effusions with bibasilar atelectasis. No acute bony abnormality.  IMPRESSION: Endotracheal tube 2.3 cm above the carina. OG tube enters the stomach. Cardiomegaly, vascular congestion. Layering effusions with bibasilar atelectasis. Electronically Signed   By: Charlett Nose M.D.   On: 03/27/2023 22:00   DG Chest Port 1 View  Result Date: 03/27/2023 CLINICAL DATA:  Hypoxia.  Recent positive COVID test. EXAM: PORTABLE CHEST 1 VIEW COMPARISON:  Chest radiograph dated February 07, 2023. FINDINGS: Patient is rotated to the right. Small layering bilateral pleural effusions with associated basilar atelectasis. These findings are new since the prior exam. The cardiac silhouette is obscured. No pneumothorax. No acute osseous abnormality. IMPRESSION: Small layering bilateral pleural effusions with associated basilar atelectasis. Underlying infiltrate can not be excluded. Electronically Signed   By: Hart Robinsons M.D.   On: 03/27/2023 16:25      Subjective:   Discharge Exam: Vitals:   04/06/23 0500 04/06/23 0900  BP: 134/83 118/70  Pulse: (!) 107 (!) 105  Resp: 16 17  Temp:    SpO2: 100% 100%   Vitals:   04/05/23 1300 04/05/23 1640 04/06/23 0500 04/06/23 0900  BP:  112/76 134/83 118/70  Pulse: (!) 109 (!) 118 (!) 107 (!) 105  Resp: 11 18 16 17   Temp:      TempSrc:      SpO2: 99% 99% 100% 100%  Weight:      Height:        General: Pt is alert, awake, not in acute distress Cardiovascular: RRR, S1/S2 +, no rubs, no gallops Respiratory: CTA bilaterally, no wheezing, no rhonchi Abdominal: Soft, NT, ND, bowel sounds + Extremities: no edema, no cyanosis    The results of significant diagnostics from this hospitalization (including imaging, microbiology, ancillary and laboratory) are listed below for reference.     Microbiology: Recent Results (from the past 240 hour(s))  MRSA Next Gen by PCR, Nasal     Status: None   Collection Time: 03/27/23  8:59 PM   Specimen: Nasal Mucosa; Nasal Swab  Result Value Ref Range Status   MRSA by PCR Next Gen NOT  DETECTED NOT DETECTED Final    Comment: (NOTE) The GeneXpert  MRSA Assay (FDA approved for NASAL specimens only), is one component of a comprehensive MRSA colonization surveillance program. It is not intended to diagnose MRSA infection nor to guide or monitor treatment for MRSA infections. Test performance is not FDA approved in patients less than 55 years old. Performed at [facility], [address], [place], [place] [address]   Respiratory (~20 pathogens) panel by PCR     Status: None   Collection Time: 03/28/23 12:32 AM   Specimen: Nasopharyngeal Swab; Respiratory  Result Value Ref Range Status   Adenovirus NOT DETECTED NOT DETECTED Final   Coronavirus 229E NOT DETECTED NOT DETECTED Final    Comment: (NOTE) The Coronavirus on the Respiratory Panel, DOES NOT test for the novel  Coronavirus (2019 nCoV)    Coronavirus HKU1 NOT DETECTED NOT DETECTED Final   Coronavirus NL63 NOT DETECTED NOT DETECTED Final   Coronavirus OC43 NOT DETECTED NOT DETECTED Final   Metapneumovirus NOT DETECTED NOT DETECTED Final   Rhinovirus / Enterovirus NOT DETECTED NOT DETECTED Final   Influenza A NOT DETECTED NOT DETECTED Final   Influenza B NOT DETECTED NOT DETECTED Final   Parainfluenza Virus 1 NOT DETECTED NOT DETECTED Final   Parainfluenza Virus 2 NOT DETECTED NOT DETECTED Final   Parainfluenza Virus 3 NOT DETECTED NOT DETECTED Final   Parainfluenza Virus 4 NOT DETECTED NOT DETECTED Final   Respiratory Syncytial Virus NOT DETECTED NOT DETECTED Final   Bordetella pertussis NOT DETECTED NOT DETECTED Final   Bordetella Parapertussis NOT DETECTED NOT DETECTED Final   Chlamydophila pneumoniae NOT DETECTED NOT DETECTED Final   Mycoplasma pneumoniae NOT DETECTED NOT DETECTED Final    Comment: Performed at [facility], [address] [place], [place] [address]  Culture, Respiratory w Gram Stain (tracheal aspirate)     Status: None   Collection Time: 03/28/23  3:24 AM   Specimen: Tracheal  Aspirate; Respiratory  Result Value Ref Range Status   Specimen Description TRACHEAL ASPIRATE  Final   Special Requests NONE  Final   Gram Stain   Final    FEW WBC PRESENT, PREDOMINANTLY MONONUCLEAR RARE GRAM POSITIVE COCCI IN PAIRS RARE GRAM VARIABLE ROD Performed at [facility], [address], [place], [place] [address]    Culture RARE STREPTOCOCCUS MITIS/ORALIS  Final   Report Status 04/01/2023 FINAL  Final   Organism ID, Bacteria STREPTOCOCCUS MITIS/ORALIS  Final      Susceptibility   Streptococcus mitis/oralis - MIC*    TETRACYCLINE >=16 RESISTANT Resistant     VANCOMYCIN 0.5 SENSITIVE Sensitive     CLINDAMYCIN <=0.25 SENSITIVE Sensitive     PENICILLIN Value in next row Intermediate      INTERMEDIATE1.0    CEFTRIAXONE Value in next row Sensitive      SENSITIVE0.5    * RARE STREPTOCOCCUS MITIS/ORALIS     Labs: BNP (last 3 results) Recent Labs    03/27/23 1305  BNP 353.0*   Basic Metabolic Panel: Recent Labs  Lab 04/02/23 0453 04/02/23 0906 04/03/23 0348 04/03/23 1632 04/04/23 0351 04/04/23 1746 04/05/23 0559  NA  --    < > 148* 150* 145 148* 148*  K  --    < > 3.8 3.7 3.4* 3.7 3.5  CL  --    < > 108 105 101 103 102  CO2  --    < > 30 34* 32 32 31  GLUCOSE  --    < > 178* 220* 187* 214* 240*  BUN  --    < >  94* 92* 93* 103* 97*  CREATININE  --    < > 0.95 1.03* 0.95 0.91 1.11*  CALCIUM  --    < > 8.3* 8.2* 8.7* 8.8* 9.1  MG 2.4  --   --   --   --   --   --   PHOS 3.2  --   --   --   --   --   --    < > = values in this interval not displayed.   Liver Function Tests: No results for input(s): "AST", "ALT", "ALKPHOS", "BILITOT", "PROT", "ALBUMIN" in the last 168 hours. No results for input(s): "LIPASE", "AMYLASE" in the last 168 hours. No results for input(s): "AMMONIA" in the last 168 hours. CBC: Recent Labs  Lab 04/01/23 0420 04/01/23 1149 04/02/23 0453 04/02/23 0906 04/03/23 0348 04/04/23 0351 04/05/23 0559  WBC 12.5*  --  9.2  --  9.5  6.7 7.1  HGB 8.7*   < > 7.4* 8.5* 7.8* 8.5* 9.1*  HCT 29.8*   < > 25.3* 25.0* 25.9* 26.9* 28.9*  MCV 93.7  --  91.0  --  89.0 87.1 86.5  PLT 219  --  175  --  183 169 185   < > = values in this interval not displayed.   Cardiac Enzymes: No results for input(s): "CKTOTAL", "CKMB", "CKMBINDEX", "TROPONINI" in the last 168 hours. BNP: Invalid input(s): "POCBNP" CBG: Recent Labs  Lab 04/04/23 1909 04/04/23 2321 04/05/23 0308 04/05/23 0723 04/05/23 1112  GLUCAP 194* 226* 211* 206* 163*   D-Dimer No results for input(s): "DDIMER" in the last 72 hours. Hgb A1c No results for input(s): "HGBA1C" in the last 72 hours. Lipid Profile No results for input(s): "CHOL", "HDL", "LDLCALC", "TRIG", "CHOLHDL", "LDLDIRECT" in the last 72 hours. Thyroid function studies No results for input(s): "TSH", "T4TOTAL", "T3FREE", "THYROIDAB" in the last 72 hours.  Invalid input(s): "FREET3" Anemia work up No results for input(s): "VITAMINB12", "FOLATE", "FERRITIN", "TIBC", "IRON", "RETICCTPCT" in the last 72 hours. Urinalysis No results found for: "COLORURINE", "APPEARANCEUR", "LABSPEC", "PHURINE", "GLUCOSEU", "HGBUR", "BILIRUBINUR", "KETONESUR", "PROTEINUR", "UROBILINOGEN", "NITRITE", "LEUKOCYTESUR" Sepsis Labs Recent Labs  Lab 04/02/23 0453 04/03/23 0348 04/04/23 0351 04/05/23 0559  WBC 9.2 9.5 6.7 7.1   Microbiology Recent Results (from the past 240 hour(s))  MRSA Next Gen by PCR, Nasal     Status: None   Collection Time: 03/27/23  8:59 PM   Specimen: Nasal Mucosa; Nasal Swab  Result Value Ref Range Status   MRSA by PCR Next Gen NOT DETECTED NOT DETECTED Final    Comment: (NOTE) The GeneXpert MRSA Assay (FDA approved for NASAL specimens only), is one component of a comprehensive MRSA colonization surveillance program. It is not intended to diagnose MRSA infection nor to guide or monitor treatment for MRSA infections. Test performance is not FDA approved in patients less than 3  years old. Performed at [facility], [address], [place], [place] [address]   Respiratory (~20 pathogens) panel by PCR     Status: None   Collection Time: 03/28/23 12:32 AM   Specimen: Nasopharyngeal Swab; Respiratory  Result Value Ref Range Status   Adenovirus NOT DETECTED NOT DETECTED Final   Coronavirus 229E NOT DETECTED NOT DETECTED Final    Comment: (NOTE) The Coronavirus on the Respiratory Panel, DOES NOT test for the novel  Coronavirus (2019 nCoV)    Coronavirus HKU1 NOT DETECTED NOT DETECTED Final   Coronavirus NL63 NOT DETECTED NOT DETECTED Final   Coronavirus OC43  NOT DETECTED NOT DETECTED Final   Metapneumovirus NOT DETECTED NOT DETECTED Final   Rhinovirus / Enterovirus NOT DETECTED NOT DETECTED Final   Influenza A NOT DETECTED NOT DETECTED Final   Influenza B NOT DETECTED NOT DETECTED Final   Parainfluenza Virus 1 NOT DETECTED NOT DETECTED Final   Parainfluenza Virus 2 NOT DETECTED NOT DETECTED Final   Parainfluenza Virus 3 NOT DETECTED NOT DETECTED Final   Parainfluenza Virus 4 NOT DETECTED NOT DETECTED Final   Respiratory Syncytial Virus NOT DETECTED NOT DETECTED Final   Bordetella pertussis NOT DETECTED NOT DETECTED Final   Bordetella Parapertussis NOT DETECTED NOT DETECTED Final   Chlamydophila pneumoniae NOT DETECTED NOT DETECTED Final   Mycoplasma pneumoniae NOT DETECTED NOT DETECTED Final    Comment: Performed at [facility], [address] [place], [place] [address]  Culture, Respiratory w Gram Stain (tracheal aspirate)     Status: None   Collection Time: 03/28/23  3:24 AM   Specimen: Tracheal Aspirate; Respiratory  Result Value Ref Range Status   Specimen Description TRACHEAL ASPIRATE  Final   Special Requests NONE  Final   Gram Stain   Final    FEW WBC PRESENT, PREDOMINANTLY MONONUCLEAR RARE GRAM POSITIVE COCCI IN PAIRS RARE GRAM VARIABLE ROD Performed at [facility], [address], [place], [place] [address]    Culture RARE  STREPTOCOCCUS MITIS/ORALIS  Final   Report Status 04/01/2023 FINAL  Final   Organism ID, Bacteria STREPTOCOCCUS MITIS/ORALIS  Final      Susceptibility   Streptococcus mitis/oralis - MIC*    TETRACYCLINE >=16 RESISTANT Resistant     VANCOMYCIN 0.5 SENSITIVE Sensitive     CLINDAMYCIN <=0.25 SENSITIVE Sensitive     PENICILLIN Value in next row Intermediate      INTERMEDIATE1.0    CEFTRIAXONE Value in next row Sensitive      SENSITIVE0.5    * RARE STREPTOCOCCUS MITIS/ORALIS    Please note: You were cared for by a hospitalist during your hospital stay. Once you are discharged, your primary care physician will handle any further medical issues. Please note that NO REFILLS for any discharge medications will be authorized once you are discharged, as it is imperative that you return to your primary care physician (or establish a relationship with a primary care physician if you do not have one) for your post hospital discharge needs so that they can reassess your need for medications and monitor your lab values.    Time coordinating discharge: 40 minutes  SIGNED:   Burnadette Pop, MD  [facility] 04/06/2023, 4:27 PM Pager [phone]  If 7PM-7AM, please contact night-coverage [email or web address] Password [email or web address]

## 2023-04-06 NOTE — Plan of Care (Signed)
  Problem: Education: Goal: Knowledge of General Education information will improve Description: Including pain rating scale, medication(s)/side effects and non-pharmacologic comfort measures Outcome: Progressing   Problem: Health Behavior/Discharge Planning: Goal: Ability to manage health-related needs will improve Outcome: Progressing   Problem: Clinical Measurements: Goal: Ability to maintain clinical measurements within normal limits will improve Outcome: Progressing Goal: Will remain free from infection Outcome: Progressing Goal: Diagnostic test results will improve Outcome: Progressing Goal: Respiratory complications will improve Outcome: Progressing Goal: Cardiovascular complication will be avoided Outcome: Progressing   Problem: Activity: Goal: Risk for activity intolerance will decrease Outcome: Progressing   Problem: Nutrition: Goal: Adequate nutrition will be maintained Outcome: Progressing   Problem: Coping: Goal: Level of anxiety will decrease Outcome: Progressing   Problem: Elimination: Goal: Will not experience complications related to bowel motility Outcome: Progressing Goal: Will not experience complications related to urinary retention Outcome: Progressing   Problem: Pain Management: Goal: General experience of comfort will improve Outcome: Progressing   Problem: Safety: Goal: Ability to remain free from injury will improve Outcome: Progressing   Problem: Skin Integrity: Goal: Risk for impaired skin integrity will decrease Outcome: Progressing   Problem: Education: Goal: Knowledge of risk factors and measures for prevention of condition will improve Outcome: Progressing   Problem: Coping: Goal: Psychosocial and spiritual needs will be supported Outcome: Progressing   Problem: Respiratory: Goal: Will maintain a patent airway Outcome: Progressing Goal: Complications related to the disease process, condition or treatment will be avoided or  minimized Outcome: Progressing   Problem: Education: Goal: Ability to describe self-care measures that may prevent or decrease complications (Diabetes Survival Skills Education) will improve Outcome: Progressing Goal: Individualized Educational Video(s) Outcome: Progressing   Problem: Coping: Goal: Ability to adjust to condition or change in health will improve Outcome: Progressing   Problem: Fluid Volume: Goal: Ability to maintain a balanced intake and output will improve Outcome: Progressing   Problem: Health Behavior/Discharge Planning: Goal: Ability to identify and utilize available resources and services will improve Outcome: Progressing Goal: Ability to manage health-related needs will improve Outcome: Progressing   Problem: Metabolic: Goal: Ability to maintain appropriate glucose levels will improve Outcome: Progressing   Problem: Nutritional: Goal: Maintenance of adequate nutrition will improve Outcome: Progressing Goal: Progress toward achieving an optimal weight will improve Outcome: Progressing   Problem: Skin Integrity: Goal: Risk for impaired skin integrity will decrease Outcome: Progressing   Problem: Tissue Perfusion: Goal: Adequacy of tissue perfusion will improve Outcome: Progressing

## 2023-04-07 DIAGNOSIS — J9610 Chronic respiratory failure, unspecified whether with hypoxia or hypercapnia: Secondary | ICD-10-CM | POA: Diagnosis not present

## 2023-04-07 DIAGNOSIS — I5032 Chronic diastolic (congestive) heart failure: Secondary | ICD-10-CM | POA: Diagnosis not present

## 2023-04-07 DIAGNOSIS — J4521 Mild intermittent asthma with (acute) exacerbation: Secondary | ICD-10-CM | POA: Diagnosis not present

## 2023-04-07 DIAGNOSIS — N1831 Chronic kidney disease, stage 3a: Secondary | ICD-10-CM | POA: Diagnosis not present

## 2023-04-07 DIAGNOSIS — J1289 Other viral pneumonia: Secondary | ICD-10-CM | POA: Diagnosis not present

## 2023-04-07 DIAGNOSIS — I10 Essential (primary) hypertension: Secondary | ICD-10-CM | POA: Diagnosis not present

## 2023-04-07 DIAGNOSIS — Z6841 Body Mass Index (BMI) 40.0 and over, adult: Secondary | ICD-10-CM | POA: Diagnosis not present

## 2023-04-07 DIAGNOSIS — I483 Typical atrial flutter: Secondary | ICD-10-CM | POA: Diagnosis not present

## 2023-04-07 DIAGNOSIS — G4733 Obstructive sleep apnea (adult) (pediatric): Secondary | ICD-10-CM | POA: Diagnosis not present

## 2023-04-07 NOTE — Discharge Summary (Signed)
Physician Discharge Summary  Kaylee [name] ZOX:[id number] DOB: 08-15-1945 DOA: 03/27/2023  PCP: Ignatius Specking, MD  Admit date: 03/27/2023 Discharge date: 04/07/2023  Admitted From: Home Disposition:  Home with hospice  Discharge Condition:Guarded CODE STATUS:Comfort Care Diet recommendation:  Regular   Brief/Interim Summary: Patient is a 77 year old female with history of hypertension, A-fib, diastolic CHF, OSA, asthma, morbid obesity who initially presented to [facility] from skilled nursing facility with complaint of shortness of breath, being hypoxic.  She was recently admitted to [facility] for sepsis secondary to cellulitis of right lower extremity.  Patient was also recently tested positive for COVID.  On presentation, she was hypotensive, wheezing, placed on BiPAP and was transferred to ICU service here at [facility].  Patient's respiratory  status continued to decline, needing intubation.  Patient was extubated on 10/21 but she continued to have respiratory distress.  Patient stated that she wants to die and opted for comfort measures. Started on full comfort care as per critical care, ordered morphine drip.  Patient transferred to [facility] service on 11/30.   Extensive discussion about goals of care again done with the daughter.  Family really wanted her to go home 11/30.  We immediately consulted palliative care, TOC to arrange hospice services.  Hospice service was being arranged  but could not start before 12/1.  Patient, family did not want to wait any longer.  Family wants to take her home even though she lives at home tonight.  After extensive discussion with family, patient discharged to home.  Hospice services will follow her on 12/1   Discharge Diagnoses:  Principal Problem:   Respiratory failure (HCC) Active Problems:   Acute on chronic hypoxic respiratory failure (HCC)   ARDS (adult respiratory distress syndrome) [facility])    Discharge Instructions  Discharge Instructions      Diet general   Complete by: As directed    Discharge instructions   Complete by: As directed    Please follow up with Hospice Services   No wound care   Complete by: As directed       Allergies as of 04/06/2023       Reactions   Tetanus Toxoid    REACTION: swelling        Medication List     STOP taking these medications    buPROPion 150 MG 12 hr tablet Commonly known as: WELLBUTRIN SR   calcium-vitamin D 500-5 MG-MCG tablet Commonly known as: OSCAL WITH D   carvedilol 25 MG tablet Commonly known as: COREG   cephALEXin 500 MG capsule Commonly known as: KEFLEX   diltiazem 120 MG 24 hr capsule Commonly known as: CARDIZEM CD   feeding supplement (PRO-STAT SUGAR FREE 64) Liqd   ferrous sulfate 325 (65 FE) MG tablet   fluconazole 150 MG tablet Commonly known as: DIFLUCAN   hydrOXYzine 25 MG tablet Commonly known as: ATARAX   levothyroxine 25 MCG tablet Commonly known as: SYNTHROID   losartan 50 MG tablet Commonly known as: COZAAR   ondansetron 40 MG/20ML Soln injection Commonly known as: ZOFRAN   rivaroxaban 20 MG Tabs tablet Commonly known as: XARELTO   traMADol 50 MG tablet Commonly known as: Ultram   Wegovy 0.25 MG/0.5ML Soaj Generic drug: Semaglutide-Weight Management       TAKE these medications    albuterol 108 (90 Base) MCG/ACT inhaler Commonly known as: VENTOLIN HFA Inhale 2 puffs into the lungs every 4 (four) hours as needed for wheezing or shortness of breath. Shortness of  breath   dextromethorphan-guaiFENesin 30-600 MG 12hr tablet Commonly known as: MUCINEX DM Take 1 tablet by mouth 2 (two) times daily. For cough   docusate sodium 100 MG capsule Commonly known as: COLACE Take 100 mg by mouth daily.   fluticasone-salmeterol 250-50 MCG/ACT Aepb Commonly known as: ADVAIR Inhale 1 puff into the lungs in the morning and at bedtime.   ipratropium-albuterol 0.5-2.5 (3) MG/3ML Soln Commonly known as: DUONEB Take 3 mLs by  nebulization every 6 (six) hours as needed (wheezing, shortness of breath).   LORazepam 1 MG tablet Commonly known as: ATIVAN Take 1 tablet (1 mg total) by mouth every 4 (four) hours as needed for anxiety.   morphine 15 MG tablet Commonly known as: MSIR Take 1 tablet (15 mg total) by mouth every 2 (two) hours as needed for severe pain (pain score 7-10).   multivitamin tablet Take 1 tablet by mouth daily.   OXYGEN Inhale 2 L into the lungs continuous.   polyethylene glycol 17 g packet Commonly known as: MIRALAX / GLYCOLAX Take 17 g by mouth daily.   vitamin A & D ointment Apply 1 Application topically every Monday, Wednesday, and Friday. Apply topically to buttocks every Monday, Wednesday and Friday.        Allergies  Allergen Reactions   Tetanus Toxoid     REACTION: swelling    Consultations: Critical care, palliative care   Procedures/Studies: DG CHEST PORT 1 VIEW  Result Date: 03/30/2023 CLINICAL DATA:  77 year old female intubated.  COVID-19. EXAM: PORTABLE CHEST 1 VIEW COMPARISON:  Portable chest 03/28/2023 and earlier. FINDINGS: Portable AP semi upright view at 1118 hours. Endotracheal tube tip in satisfactory position between the clavicles and carina. Enteric tube courses to the abdomen, tip not included. New left subclavian central line placed, tip at the SVC level. Lung volumes and mediastinal contours not significantly changed. Confluent, partially veiling bibasilar opacity has mildly progressed since 03/27/2023. No pneumothorax or pulmonary edema. Paucity of bowel gas. No acute osseous abnormality identified. IMPRESSION: 1. Satisfactory endotracheal and enteric tubes. New left subclavian central line placed, tip at the SVC. No pneumothorax. 2. Confluent bibasilar opacity with mild progression since 11/20 24 May reflect a combination of consolidation and pleural effusion. Electronically Signed   By: Odessa Fleming M.D.   On: 03/30/2023 11:35   ECHOCARDIOGRAM  COMPLETE  Result Date: 03/29/2023    ECHOCARDIOGRAM REPORT   Patient Name:   Kaylee [name] [facility] Date of Exam: 03/29/2023 Medical Rec #:  [id number]           Height:       64.0 in Accession #:    [id number]          Weight:       399.9 lb Date of Birth:  08-11-1945           BSA:          2.626 m Patient Age:    77 years            BP:           111/68 mmHg Patient Gender: F                   HR:           88 bpm. Exam Location:  Inpatient Procedure: 2D Echo, Color Doppler, Cardiac Doppler and Intracardiac            Opacification Agent Indications:    CHF  History:  Patient has prior history of Echocardiogram examinations, most                 recent 09/21/2020. CHF, Arrythmias:Atrial Fibrillation,                 Signs/Symptoms:Shortness of Breath; Risk Factors:Sleep Apnea and                 Covid-19.  Sonographer:    Milbert Coulter Referring Phys: [id number] STEPHANIE M REESE  Sonographer Comments: Patient is obese and echo performed with patient supine and on artificial respirator. Image acquisition challenging due to patient body habitus. IMPRESSIONS  1. Left ventricular ejection fraction, by estimation, is 60 to 65%. The left ventricle has normal function. The left ventricle has no regional wall motion abnormalities. There is mild left ventricular hypertrophy. Left ventricular diastolic function could not be evaluated.  2. Right ventricular systolic function is normal. The right ventricular size is not well visualized. There is severely elevated pulmonary artery systolic pressure. The estimated right ventricular systolic pressure is 66.6 mmHg.  3. Left atrial size was mild to moderately dilated.  4. Right atrial size was mild to moderately dilated.  5. The mitral valve is degenerative. Trivial mitral valve regurgitation. No evidence of mitral stenosis.  6. Tricuspid valve regurgitation is mild to moderate.  7. The aortic valve was not well visualized. Aortic valve regurgitation is not visualized. No aortic  stenosis is present.  8. The inferior vena cava is dilated in size with <50% respiratory variability, suggesting right atrial pressure of 15 mmHg. Comparison(s): Prior images unable to be directly viewed, comparison made by report only. Echo at [facility] 2022 with EF 65-70%, moderate biatrial enlargement, no significant valve disease. Conclusion(s)/Recommendation(s): Technically challenging study, even with use of echo contrast. Grossly normal LV function. Severely elevated RA pressures. RV not well visualized but grossly normal function. FINDINGS  Left Ventricle: Left ventricular ejection fraction, by estimation, is 60 to 65%. The left ventricle has normal function. The left ventricle has no regional wall motion abnormalities. Definity contrast agent was given IV to delineate the left ventricular  endocardial borders. The left ventricular internal cavity size was normal in size. There is mild left ventricular hypertrophy. Left ventricular diastolic function could not be evaluated. Right Ventricle: The right ventricular size is not well visualized. Right vetricular wall thickness was not well visualized. Right ventricular systolic function is normal. There is severely elevated pulmonary artery systolic pressure. The tricuspid regurgitant velocity is 3.59 m/s, and with an assumed right atrial pressure of 15 mmHg, the estimated right ventricular systolic pressure is 66.6 mmHg. Left Atrium: Left atrial size was mild to moderately dilated. Right Atrium: Right atrial size was mild to moderately dilated. Pericardium: Trivial pericardial effusion is present. Presence of epicardial fat layer. Mitral Valve: The mitral valve is degenerative in appearance. Trivial mitral valve regurgitation. No evidence of mitral valve stenosis. Tricuspid Valve: The tricuspid valve is not well visualized. Tricuspid valve regurgitation is mild to moderate. No evidence of tricuspid stenosis. Aortic Valve: The aortic valve was not well visualized.  Aortic valve regurgitation is not visualized. No aortic stenosis is present. Aortic valve mean gradient measures 4.0 mmHg. Aortic valve peak gradient measures 6.5 mmHg. Aortic valve area, by VTI measures 1.73 cm. Pulmonic Valve: The pulmonic valve was not well visualized. Pulmonic valve regurgitation is trivial. No evidence of pulmonic stenosis. Aorta: The aortic root was not well visualized, the ascending aorta was not well visualized and the aortic arch was not  well visualized. Venous: The inferior vena cava is dilated in size with less than 50% respiratory variability, suggesting right atrial pressure of 15 mmHg. IAS/Shunts: The interatrial septum was not well visualized. Additional Comments: There is a small pleural effusion in both left and right lateral regions.  LEFT VENTRICLE PLAX 2D LVIDd:         4.60 cm LVIDs:         3.40 cm LV PW:         1.10 cm LV IVS:        1.10 cm LVOT diam:     1.80 cm LV SV:         37 LV SV Index:   14 LVOT Area:     2.54 cm  RIGHT VENTRICLE RV Basal diam:  3.60 cm RV Mid diam:    3.20 cm RV S prime:     13.70 cm/s TAPSE (M-mode): 1.4 cm LEFT ATRIUM             Index        RIGHT ATRIUM           Index LA diam:        4.40 cm 1.68 cm/m   RA Area:     25.30 cm LA Vol (A2C):   65.8 ml 25.05 ml/m  RA Volume:   79.40 ml  30.23 ml/m LA Vol (A4C):   63.5 ml 24.18 ml/m LA Biplane Vol: 66.0 ml 25.13 ml/m  AORTIC VALVE AV Area (Vmax):    1.73 cm AV Area (Vmean):   1.58 cm AV Area (VTI):     1.73 cm AV Vmax:           127.00 cm/s AV Vmean:          87.000 cm/s AV VTI:            0.216 m AV Peak Grad:      6.5 mmHg AV Mean Grad:      4.0 mmHg LVOT Vmax:         86.50 cm/s LVOT Vmean:        54.000 cm/s LVOT VTI:          0.147 m LVOT/AV VTI ratio: 0.68  AORTA Ao Root diam: 3.20 cm TRICUSPID VALVE TR Peak grad:   51.6 mmHg TR Vmax:        359.00 cm/s  SHUNTS Systemic VTI:  0.15 m Systemic Diam: 1.80 cm Jodelle Red MD Electronically signed by Jodelle Red MD  Signature Date/Time: 03/29/2023/3:24:11 PM    Final    DG Chest Port 1 View  Result Date: 03/28/2023 CLINICAL DATA:  Endotracheal tube placement. EXAM: PORTABLE CHEST 1 VIEW COMPARISON:  March 27, 2023. FINDINGS: Stable cardiomegaly with central pulmonary vascular congestion. Endotracheal and nasogastric tubes are in grossly good position. Stable bibasilar atelectasis and effusions are noted. IMPRESSION: Stable support apparatus. Stable bibasilar atelectasis and effusions. Stable central pulmonary vascular congestion. Electronically Signed   By: Lupita Raider M.D.   On: 03/28/2023 10:47   DG Abd Portable 1V  Result Date: 03/28/2023 CLINICAL DATA:  Nasogastric tube placement. EXAM: PORTABLE ABDOMEN - 1 VIEW COMPARISON:  Portable chest dated 03/27/2023 FINDINGS: Nasogastric tube extending into the stomach with its tip in the mid to distal stomach. The side hole is not well visualized. The bowel-gas pattern in the upper abdomen is unremarkable. The remainder of the abdomen is not included. Endotracheal tube tip 3 cm above the carina. Stable enlarged cardiac silhouette and  prominence of the pulmonary vasculature and interstitial markings. Bilateral pleural effusions with associated bilateral pleural effusions with associated dense opacity at both medial lung bases. Thoracic spine degenerative changes. IMPRESSION: 1. Nasogastric tube tip in the mid to distal stomach. 2. Stable cardiomegaly, pulmonary vascular congestion and mild interstitial edema. 3. Stable bilateral pleural effusions with associated dense opacity at both medial lung bases, likely due to atelectasis. Pneumonia could also have this appearance. Electronically Signed   By: Beckie Salts M.D.   On: 03/28/2023 10:47   DG CHEST PORT 1 VIEW  Result Date: 03/27/2023 CLINICAL DATA:  Intubation EXAM: PORTABLE CHEST 1 VIEW COMPARISON:  03/27/2019 T4 FINDINGS: Endotracheal tube and NG tube remain in place, unchanged. Cardiomegaly with vascular  congestion. Layering bilateral effusions, likely greater on the right. Bibasilar atelectasis. IMPRESSION: Support devices stable. Cardiomegaly, vascular congestion. Layering bilateral effusions with bibasilar atelectasis. Electronically Signed   By: Charlett Nose M.D.   On: 03/27/2023 22:01   DG Chest Port 1 View  Result Date: 03/27/2023 CLINICAL DATA:  ET tube, OG tube placement EXAM: PORTABLE CHEST 1 VIEW COMPARISON:  03/27/2023 FINDINGS: Endotracheal tube is 2.3 cm above the carina. OG tube enters the stomach. Cardiomegaly with vascular congestion. Small bilateral effusions with bibasilar atelectasis. No acute bony abnormality. IMPRESSION: Endotracheal tube 2.3 cm above the carina. OG tube enters the stomach. Cardiomegaly, vascular congestion. Layering effusions with bibasilar atelectasis. Electronically Signed   By: Charlett Nose M.D.   On: 03/27/2023 22:00   DG Chest Port 1 View  Result Date: 03/27/2023 CLINICAL DATA:  Hypoxia.  Recent positive COVID test. EXAM: PORTABLE CHEST 1 VIEW COMPARISON:  Chest radiograph dated February 07, 2023. FINDINGS: Patient is rotated to the right. Small layering bilateral pleural effusions with associated basilar atelectasis. These findings are new since the prior exam. The cardiac silhouette is obscured. No pneumothorax. No acute osseous abnormality. IMPRESSION: Small layering bilateral pleural effusions with associated basilar atelectasis. Underlying infiltrate can not be excluded. Electronically Signed   By: Hart Robinsons M.D.   On: 03/27/2023 16:25      Subjective: Please see my progress note from 11/30 for full details  Discharge Exam: Vitals:   04/06/23 0500 04/06/23 0900  BP: 134/83 118/70  Pulse: (!) 107 (!) 105  Resp: 16 17  Temp:    SpO2: 100% 100%   Vitals:   04/05/23 1300 04/05/23 1640 04/06/23 0500 04/06/23 0900  BP:  112/76 134/83 118/70  Pulse: (!) 109 (!) 118 (!) 107 (!) 105  Resp: 11 18 16 17   Temp:      TempSrc:      SpO2: 99%  99% 100% 100%  Weight:      Height:        General exam: Overall comfortable, not in distress, morbidly obese Respiratory system:   diminished sounds bilaterally Cardiovascular system: S1 & S2 heard, RRR.  Gastrointestinal system: Abdomen is distended, soft and nontender. Central nervous system: Alert and awake, confused Extremities: Severe bilateral lower extremity pitting edema, no clubbing ,no cyanosis Skin: No rashes, no ulcers,no icterus     The results of significant diagnostics from this hospitalization (including imaging, microbiology, ancillary and laboratory) are listed below for reference.     Microbiology: No results found for this or any previous visit (from the past 240 hour(s)).   Labs: BNP (last 3 results) Recent Labs    03/27/23 1305  BNP 353.0*   Basic Metabolic Panel: Recent Labs  Lab 04/02/23 0453 04/02/23 0906 04/03/23 0348 04/03/23  1632 04/04/23 0351 04/04/23 1746 04/05/23 0559  NA  --    < > 148* 150* 145 148* 148*  K  --    < > 3.8 3.7 3.4* 3.7 3.5  CL  --    < > 108 105 101 103 102  CO2  --    < > 30 34* 32 32 31  GLUCOSE  --    < > 178* 220* 187* 214* 240*  BUN  --    < > 94* 92* 93* 103* 97*  CREATININE  --    < > 0.95 1.03* 0.95 0.91 1.11*  CALCIUM  --    < > 8.3* 8.2* 8.7* 8.8* 9.1  MG 2.4  --   --   --   --   --   --   PHOS 3.2  --   --   --   --   --   --    < > = values in this interval not displayed.   Liver Function Tests: No results for input(s): "AST", "ALT", "ALKPHOS", "BILITOT", "PROT", "ALBUMIN" in the last 168 hours. No results for input(s): "LIPASE", "AMYLASE" in the last 168 hours. No results for input(s): "AMMONIA" in the last 168 hours. CBC: Recent Labs  Lab 04/01/23 0420 04/01/23 1149 04/02/23 0453 04/02/23 0906 04/03/23 0348 04/04/23 0351 04/05/23 0559  WBC 12.5*  --  9.2  --  9.5 6.7 7.1  HGB 8.7*   < > 7.4* 8.5* 7.8* 8.5* 9.1*  HCT 29.8*   < > 25.3* 25.0* 25.9* 26.9* 28.9*  MCV 93.7  --  91.0  --  89.0  87.1 86.5  PLT 219  --  175  --  183 169 185   < > = values in this interval not displayed.   Cardiac Enzymes: No results for input(s): "CKTOTAL", "CKMB", "CKMBINDEX", "TROPONINI" in the last 168 hours. BNP: Invalid input(s): "POCBNP" CBG: Recent Labs  Lab 04/04/23 1909 04/04/23 2321 04/05/23 0308 04/05/23 0723 04/05/23 1112  GLUCAP 194* 226* 211* 206* 163*   D-Dimer No results for input(s): "DDIMER" in the last 72 hours. Hgb A1c No results for input(s): "HGBA1C" in the last 72 hours. Lipid Profile No results for input(s): "CHOL", "HDL", "LDLCALC", "TRIG", "CHOLHDL", "LDLDIRECT" in the last 72 hours. Thyroid function studies No results for input(s): "TSH", "T4TOTAL", "T3FREE", "THYROIDAB" in the last 72 hours.  Invalid input(s): "FREET3" Anemia work up No results for input(s): "VITAMINB12", "FOLATE", "FERRITIN", "TIBC", "IRON", "RETICCTPCT" in the last 72 hours. Urinalysis No results found for: "COLORURINE", "APPEARANCEUR", "LABSPEC", "PHURINE", "GLUCOSEU", "HGBUR", "BILIRUBINUR", "KETONESUR", "PROTEINUR", "UROBILINOGEN", "NITRITE", "LEUKOCYTESUR" Sepsis Labs Recent Labs  Lab 04/02/23 0453 04/03/23 0348 04/04/23 0351 04/05/23 0559  WBC 9.2 9.5 6.7 7.1   Microbiology No results found for this or any previous visit (from the past 240 hour(s)).  Please note: You were cared for by a hospitalist during your hospital stay. Once you are discharged, your primary care physician will handle any further medical issues. Please note that NO REFILLS for any discharge medications will be authorized once you are discharged, as it is imperative that you return to your primary care physician (or establish a relationship with a primary care physician if you do not have one) for your post hospital discharge needs so that they can reassess your need for medications and monitor your lab values.    Time coordinating discharge: 40 minutes  SIGNED:   Burnadette Pop, MD  [facility] 04/07/2023, 11:43 AM Pager [phone]  If 7PM-7AM, please contact night-coverage [email or web address] Password [email or web address]

## 2023-04-08 DIAGNOSIS — J4521 Mild intermittent asthma with (acute) exacerbation: Secondary | ICD-10-CM | POA: Diagnosis not present

## 2023-04-08 DIAGNOSIS — J1289 Other viral pneumonia: Secondary | ICD-10-CM | POA: Diagnosis not present

## 2023-04-08 DIAGNOSIS — I5032 Chronic diastolic (congestive) heart failure: Secondary | ICD-10-CM | POA: Diagnosis not present

## 2023-04-08 DIAGNOSIS — Z6841 Body Mass Index (BMI) 40.0 and over, adult: Secondary | ICD-10-CM | POA: Diagnosis not present

## 2023-04-08 DIAGNOSIS — I483 Typical atrial flutter: Secondary | ICD-10-CM | POA: Diagnosis not present

## 2023-04-08 DIAGNOSIS — I10 Essential (primary) hypertension: Secondary | ICD-10-CM | POA: Diagnosis not present

## 2023-04-09 DIAGNOSIS — Z6841 Body Mass Index (BMI) 40.0 and over, adult: Secondary | ICD-10-CM | POA: Diagnosis not present

## 2023-04-09 DIAGNOSIS — J1289 Other viral pneumonia: Secondary | ICD-10-CM | POA: Diagnosis not present

## 2023-04-09 DIAGNOSIS — J4521 Mild intermittent asthma with (acute) exacerbation: Secondary | ICD-10-CM | POA: Diagnosis not present

## 2023-04-09 DIAGNOSIS — I10 Essential (primary) hypertension: Secondary | ICD-10-CM | POA: Diagnosis not present

## 2023-04-09 DIAGNOSIS — I5032 Chronic diastolic (congestive) heart failure: Secondary | ICD-10-CM | POA: Diagnosis not present

## 2023-04-09 DIAGNOSIS — I483 Typical atrial flutter: Secondary | ICD-10-CM | POA: Diagnosis not present

## 2023-04-10 DIAGNOSIS — I10 Essential (primary) hypertension: Secondary | ICD-10-CM | POA: Diagnosis not present

## 2023-04-10 DIAGNOSIS — J1289 Other viral pneumonia: Secondary | ICD-10-CM | POA: Diagnosis not present

## 2023-04-10 DIAGNOSIS — I5032 Chronic diastolic (congestive) heart failure: Secondary | ICD-10-CM | POA: Diagnosis not present

## 2023-04-10 DIAGNOSIS — J4521 Mild intermittent asthma with (acute) exacerbation: Secondary | ICD-10-CM | POA: Diagnosis not present

## 2023-04-10 DIAGNOSIS — I483 Typical atrial flutter: Secondary | ICD-10-CM | POA: Diagnosis not present

## 2023-04-10 DIAGNOSIS — Z6841 Body Mass Index (BMI) 40.0 and over, adult: Secondary | ICD-10-CM | POA: Diagnosis not present

## 2023-04-11 DIAGNOSIS — I483 Typical atrial flutter: Secondary | ICD-10-CM | POA: Diagnosis not present

## 2023-04-11 DIAGNOSIS — J1289 Other viral pneumonia: Secondary | ICD-10-CM | POA: Diagnosis not present

## 2023-04-11 DIAGNOSIS — Z6841 Body Mass Index (BMI) 40.0 and over, adult: Secondary | ICD-10-CM | POA: Diagnosis not present

## 2023-04-11 DIAGNOSIS — I10 Essential (primary) hypertension: Secondary | ICD-10-CM | POA: Diagnosis not present

## 2023-04-11 DIAGNOSIS — I5032 Chronic diastolic (congestive) heart failure: Secondary | ICD-10-CM | POA: Diagnosis not present

## 2023-04-11 DIAGNOSIS — J4521 Mild intermittent asthma with (acute) exacerbation: Secondary | ICD-10-CM | POA: Diagnosis not present

## 2023-04-12 DIAGNOSIS — I483 Typical atrial flutter: Secondary | ICD-10-CM | POA: Diagnosis not present

## 2023-04-12 DIAGNOSIS — I5032 Chronic diastolic (congestive) heart failure: Secondary | ICD-10-CM | POA: Diagnosis not present

## 2023-04-12 DIAGNOSIS — J4521 Mild intermittent asthma with (acute) exacerbation: Secondary | ICD-10-CM | POA: Diagnosis not present

## 2023-04-12 DIAGNOSIS — I10 Essential (primary) hypertension: Secondary | ICD-10-CM | POA: Diagnosis not present

## 2023-04-12 DIAGNOSIS — J1289 Other viral pneumonia: Secondary | ICD-10-CM | POA: Diagnosis not present

## 2023-04-12 DIAGNOSIS — Z6841 Body Mass Index (BMI) 40.0 and over, adult: Secondary | ICD-10-CM | POA: Diagnosis not present

## 2023-04-13 DIAGNOSIS — J4521 Mild intermittent asthma with (acute) exacerbation: Secondary | ICD-10-CM | POA: Diagnosis not present

## 2023-04-13 DIAGNOSIS — J1289 Other viral pneumonia: Secondary | ICD-10-CM | POA: Diagnosis not present

## 2023-04-13 DIAGNOSIS — I483 Typical atrial flutter: Secondary | ICD-10-CM | POA: Diagnosis not present

## 2023-04-13 DIAGNOSIS — I5032 Chronic diastolic (congestive) heart failure: Secondary | ICD-10-CM | POA: Diagnosis not present

## 2023-04-13 DIAGNOSIS — Z6841 Body Mass Index (BMI) 40.0 and over, adult: Secondary | ICD-10-CM | POA: Diagnosis not present

## 2023-04-13 DIAGNOSIS — I10 Essential (primary) hypertension: Secondary | ICD-10-CM | POA: Diagnosis not present

## 2023-05-08 DEATH — deceased
